# Patient Record
Sex: Female | Born: 1980 | Race: White | Hispanic: No | Marital: Married | Smoking: Current some day smoker
Health system: Southern US, Community
[De-identification: ages and names within clinical notes are randomized; demographics above are authoritative.]

## PROBLEM LIST (undated history)

## (undated) DIAGNOSIS — E039 Hypothyroidism, unspecified: Secondary | ICD-10-CM

## (undated) DIAGNOSIS — D649 Anemia, unspecified: Secondary | ICD-10-CM

## (undated) DIAGNOSIS — K8020 Calculus of gallbladder without cholecystitis without obstruction: Secondary | ICD-10-CM

## (undated) DIAGNOSIS — F419 Anxiety disorder, unspecified: Secondary | ICD-10-CM

## (undated) DIAGNOSIS — I10 Essential (primary) hypertension: Secondary | ICD-10-CM

## (undated) DIAGNOSIS — F410 Panic disorder [episodic paroxysmal anxiety] without agoraphobia: Secondary | ICD-10-CM

## (undated) DIAGNOSIS — F32A Depression, unspecified: Secondary | ICD-10-CM

## (undated) DIAGNOSIS — R51 Headache: Secondary | ICD-10-CM

## (undated) DIAGNOSIS — F329 Major depressive disorder, single episode, unspecified: Secondary | ICD-10-CM

## (undated) DIAGNOSIS — E669 Obesity, unspecified: Secondary | ICD-10-CM

## (undated) DIAGNOSIS — K219 Gastro-esophageal reflux disease without esophagitis: Secondary | ICD-10-CM

## (undated) DIAGNOSIS — Z9289 Personal history of other medical treatment: Secondary | ICD-10-CM

## (undated) DIAGNOSIS — R519 Headache, unspecified: Secondary | ICD-10-CM

## (undated) HISTORY — PX: TUBAL LIGATION: SHX77

## (undated) HISTORY — DX: Obesity, unspecified: E66.9

## (undated) HISTORY — DX: Morbid (severe) obesity due to excess calories: E66.01

## (undated) HISTORY — DX: Calculus of gallbladder without cholecystitis without obstruction: K80.20

## (undated) HISTORY — PX: DILATION AND CURETTAGE OF UTERUS: SHX78

---

## 2009-12-29 ENCOUNTER — Emergency Department (HOSPITAL_COMMUNITY): Admission: EM | Admit: 2009-12-29 | Discharge: 2009-12-29 | Payer: Self-pay | Admitting: Emergency Medicine

## 2011-01-19 ENCOUNTER — Emergency Department (HOSPITAL_COMMUNITY)
Admission: EM | Admit: 2011-01-19 | Discharge: 2011-01-19 | Payer: Self-pay | Source: Home / Self Care | Admitting: Emergency Medicine

## 2011-01-19 LAB — URINALYSIS, ROUTINE W REFLEX MICROSCOPIC
Ketones, ur: NEGATIVE mg/dL
Nitrite: NEGATIVE
Protein, ur: NEGATIVE mg/dL

## 2011-01-19 LAB — PREGNANCY, URINE: Preg Test, Ur: NEGATIVE

## 2012-05-24 ENCOUNTER — Encounter (HOSPITAL_COMMUNITY): Payer: Self-pay

## 2012-05-24 ENCOUNTER — Emergency Department (HOSPITAL_COMMUNITY)
Admission: EM | Admit: 2012-05-24 | Discharge: 2012-05-25 | Disposition: A | Discharge status: Home / Self Care | Payer: Self-pay | Attending: Emergency Medicine | Admitting: Emergency Medicine

## 2012-05-24 DIAGNOSIS — F17200 Nicotine dependence, unspecified, uncomplicated: Secondary | ICD-10-CM | POA: Insufficient documentation

## 2012-05-24 DIAGNOSIS — K0889 Other specified disorders of teeth and supporting structures: Secondary | ICD-10-CM

## 2012-05-24 DIAGNOSIS — K029 Dental caries, unspecified: Secondary | ICD-10-CM | POA: Insufficient documentation

## 2012-05-24 MED ORDER — SUCCINYLCHOLINE CHLORIDE 20 MG/ML IJ SOLN
INTRAMUSCULAR | Status: AC
  Filled 2012-05-24: qty 1

## 2012-05-24 MED ORDER — ROCURONIUM BROMIDE 50 MG/5ML IV SOLN
INTRAVENOUS | Status: AC
  Filled 2012-05-24: qty 2

## 2012-05-24 MED ORDER — ETOMIDATE 2 MG/ML IV SOLN
INTRAVENOUS | Status: AC
  Filled 2012-05-24: qty 20

## 2012-05-24 MED ORDER — LIDOCAINE HCL (CARDIAC) 20 MG/ML IV SOLN
INTRAVENOUS | Status: AC
  Filled 2012-05-24: qty 5

## 2012-05-24 NOTE — ED Notes (Signed)
Pt c/o tooth pain from broken tooth on Monday.

## 2012-05-25 MED ORDER — HYDROCODONE-ACETAMINOPHEN 5-325 MG PO TABS: ORAL_TABLET | ORAL | Status: DC

## 2012-05-25 MED ORDER — HYDROCODONE-ACETAMINOPHEN 5-325 MG PO TABS
2.0000 | ORAL_TABLET | Freq: Once | ORAL | Status: AC
  Administered 2012-05-25: 2 via ORAL
  Filled 2012-05-25: qty 2

## 2012-05-25 MED ORDER — AMOXICILLIN 500 MG PO CAPS: ORAL_CAPSULE | ORAL | Status: DC

## 2012-05-25 MED ORDER — ONDANSETRON HCL 4 MG PO TABS
4.0000 mg | ORAL_TABLET | Freq: Once | ORAL | Status: AC
  Administered 2012-05-25: 4 mg via ORAL
  Filled 2012-05-25: qty 1

## 2012-05-25 MED ORDER — IBUPROFEN 800 MG PO TABS
800.0000 mg | ORAL_TABLET | Freq: Once | ORAL | Status: AC
  Administered 2012-05-25: 800 mg via ORAL
  Filled 2012-05-25: qty 1

## 2012-05-25 MED ORDER — PENICILLIN V POTASSIUM 250 MG PO TABS
500.0000 mg | ORAL_TABLET | Freq: Once | ORAL | Status: AC
  Administered 2012-05-25: 500 mg via ORAL
  Filled 2012-05-25: qty 1

## 2012-05-25 NOTE — ED Provider Notes (Signed)
History     CSN: [id number]  Arrival date & time 05/24/12  2303   First MD Initiated Contact with Patient 05/24/12 2318      Chief Complaint  Patient presents with  . Dental Pain    (Consider location/radiation/quality/duration/timing/severity/associated sxs/prior treatment) Patient is a 31 y.o. female presenting with tooth pain. The history is provided by the patient.  Dental PainThe primary symptoms include mouth pain. Primary symptoms do not include shortness of breath or cough. The symptoms began 3 to 5 days ago. The symptoms are worsening. The symptoms are chronic. The symptoms occur frequently.  Additional symptoms include: gum swelling and jaw pain. Additional symptoms do not include: nosebleeds. Medical issues include: smoking.    History reviewed. No pertinent past medical history.  History reviewed. No pertinent past surgical history.  No family history on file.  History  Substance Use Topics  . Smoking status: Current Some Day Smoker  . Smokeless tobacco: Not on file  . Alcohol Use: No    OB History    Grav Para Term Preterm Abortions TAB SAB Ect Mult Living                  Review of Systems  Constitutional: Negative for activity change.       All ROS Neg except as noted in HPI  HENT: Negative for nosebleeds and neck pain.   Eyes: Negative for photophobia and discharge.  Respiratory: Negative for cough, shortness of breath and wheezing.   Cardiovascular: Negative for chest pain and palpitations.  Gastrointestinal: Negative for abdominal pain and blood in stool.  Genitourinary: Negative for dysuria, frequency and hematuria.  Musculoskeletal: Negative for back pain and arthralgias.  Skin: Negative.   Neurological: Negative for dizziness, seizures and speech difficulty.  Psychiatric/Behavioral: Negative for hallucinations and confusion.    Allergies  Review of patient's allergies indicates no known allergies.  Home Medications   Current Outpatient Rx   Name Route Sig Dispense Refill  . AMOXICILLIN 500 MG PO CAPS  2 po bid with food 28 capsule 0  . HYDROCODONE-ACETAMINOPHEN 5-325 MG PO TABS  1 po q4h prn pain 20 tablet 0    BP 126/87  Pulse 76  Temp 98.6 F (37 C)  Resp 20  Ht 5\' 6"  (1.676 m)  Wt 300 lb (136.079 kg)  BMI 48.42 kg/m2  SpO2 98%  LMP 05/19/2012  Physical Exam  Nursing note and vitals reviewed. Constitutional: She is oriented to person, place, and time. She appears well-developed and well-nourished.  Non-toxic appearance.  HENT:  Head: Normocephalic.  Right Ear: Tympanic membrane and external ear normal.  Left Ear: Tympanic membrane and external ear normal.       Multiple dental deep dental caries. Right side more than left.  Eyes: EOM and lids are normal. Pupils are equal, round, and reactive to light.  Neck: Normal range of motion. Neck supple. Carotid bruit is not present.  Cardiovascular: Normal rate, regular rhythm, normal heart sounds, intact distal pulses and normal pulses.   Pulmonary/Chest: Breath sounds normal. No respiratory distress.  Abdominal: Soft. Bowel sounds are normal. There is no tenderness. There is no guarding.  Musculoskeletal: Normal range of motion.  Lymphadenopathy:       Head (right side): No submandibular adenopathy present.       Head (left side): No submandibular adenopathy present.    She has no cervical adenopathy.  Neurological: She is alert and oriented to person, place, and time. She has normal strength. No  cranial nerve deficit or sensory deficit.  Skin: Skin is warm and dry.  Psychiatric: She has a normal mood and affect. Her speech is normal.    ED Course  Procedures (including critical care time)  Labs Reviewed - No data to display No results found.   1. Pain, dental       MDM  I have reviewed nursing notes, vital signs, and all appropriate lab and imaging results for this patient. Pt has an appointment with dentist in 2 weeks. Multiple dental caries present.  Rx for amoxicillin and Norco given.       Kathie Dike, [place] 05/25/12 [phone]

## 2012-05-25 NOTE — Discharge Instructions (Signed)
Dental Pain  A tooth ache may be caused by cavities (tooth decay). Cavities expose the nerve of the tooth to air and hot or cold temperatures. It may come from an infection or abscess (also called a boil or furuncle) around your tooth. It is also often caused by dental caries (tooth decay). This causes the pain you are having.  DIAGNOSIS   Your caregiver can diagnose this problem by exam.  TREATMENT   · If caused by an infection, it may be treated with medications which kill germs (antibiotics) and pain medications as prescribed by your caregiver. Take medications as directed.  · Only take over-the-counter or prescription medicines for pain, discomfort, or fever as directed by your caregiver.  · Whether the tooth ache today is caused by infection or dental disease, you should see your dentist as soon as possible for further care.  SEEK MEDICAL CARE IF:  The exam and treatment you received today has been provided on an emergency basis only. This is not a substitute for complete medical or dental care. If your problem worsens or new problems (symptoms) appear, and you are unable to meet with your dentist, call or return to this location.  SEEK IMMEDIATE MEDICAL CARE IF:   · You have a fever.  · You develop redness and swelling of your face, jaw, or neck.  · You are unable to open your mouth.  · You have severe pain uncontrolled by pain medicine.  MAKE SURE YOU:   · Understand these instructions.  · Will watch your condition.  · Will get help right away if you are not doing well or get worse.  Document Released: 12/06/2005 Document Revised: 11/25/2011 Document Reviewed: 07/24/2008  [employer]® Patient Information ©2012 [employer].

## 2012-05-25 NOTE — ED Provider Notes (Signed)
Medical screening examination/treatment/procedure(s) were performed by non-physician practitioner and as supervising physician I was immediately available for consultation/collaboration.  Nicoletta Dress. Colon Branch, MD 05/25/12 [phone]

## 2012-06-13 ENCOUNTER — Emergency Department (HOSPITAL_COMMUNITY)
Admission: EM | Admit: 2012-06-13 | Discharge: 2012-06-14 | Disposition: A | Discharge status: Home / Self Care | Payer: No Typology Code available for payment source | Attending: Emergency Medicine | Admitting: Emergency Medicine

## 2012-06-13 ENCOUNTER — Encounter (HOSPITAL_COMMUNITY): Payer: Self-pay | Admitting: *Deleted

## 2012-06-13 ENCOUNTER — Emergency Department (HOSPITAL_COMMUNITY): Payer: No Typology Code available for payment source

## 2012-06-13 DIAGNOSIS — S139XXA Sprain of joints and ligaments of unspecified parts of neck, initial encounter: Secondary | ICD-10-CM | POA: Insufficient documentation

## 2012-06-13 DIAGNOSIS — H6690 Otitis media, unspecified, unspecified ear: Secondary | ICD-10-CM

## 2012-06-13 DIAGNOSIS — M542 Cervicalgia: Secondary | ICD-10-CM | POA: Insufficient documentation

## 2012-06-13 DIAGNOSIS — S161XXA Strain of muscle, fascia and tendon at neck level, initial encounter: Secondary | ICD-10-CM

## 2012-06-13 MED ORDER — NAPROXEN 500 MG PO TABS: 500.0000 mg | ORAL_TABLET | Freq: Two times a day (BID) | ORAL | Status: AC

## 2012-06-13 MED ORDER — CYCLOBENZAPRINE HCL 10 MG PO TABS: 10.0000 mg | ORAL_TABLET | Freq: Three times a day (TID) | ORAL | Status: AC | PRN
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
## 2012-06-13 NOTE — ED Notes (Signed)
Pt back from CT
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
## 2012-06-13 NOTE — ED Notes (Signed)
MD at bedside. 
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
## 2012-06-14 NOTE — ED Provider Notes (Signed)
Medical screening examination/treatment/procedure(s) were performed by non-physician practitioner and as supervising physician I was immediately available for consultation/collaboration.   Dione Booze, MD 06/14/12 [phone]

## 2012-11-01 ENCOUNTER — Emergency Department (HOSPITAL_COMMUNITY): Payer: Self-pay

## 2012-11-01 ENCOUNTER — Encounter (HOSPITAL_COMMUNITY): Payer: Self-pay | Admitting: Emergency Medicine

## 2012-11-01 ENCOUNTER — Emergency Department (HOSPITAL_COMMUNITY)
Admission: EM | Admit: 2012-11-01 | Discharge: 2012-11-01 | Disposition: A | Discharge status: Home / Self Care | Payer: Self-pay | Attending: Emergency Medicine | Admitting: Emergency Medicine

## 2012-11-01 DIAGNOSIS — F17200 Nicotine dependence, unspecified, uncomplicated: Secondary | ICD-10-CM | POA: Insufficient documentation

## 2012-11-01 DIAGNOSIS — Z791 Long term (current) use of non-steroidal anti-inflammatories (NSAID): Secondary | ICD-10-CM | POA: Insufficient documentation

## 2012-11-01 DIAGNOSIS — I10 Essential (primary) hypertension: Secondary | ICD-10-CM | POA: Insufficient documentation

## 2012-11-01 DIAGNOSIS — R1310 Dysphagia, unspecified: Secondary | ICD-10-CM | POA: Insufficient documentation

## 2012-11-01 DIAGNOSIS — R1110 Vomiting, unspecified: Secondary | ICD-10-CM | POA: Insufficient documentation

## 2012-11-01 HISTORY — DX: Essential (primary) hypertension: I10

## 2012-11-01 NOTE — ED Notes (Signed)
Pt states "I feel like my throat is closing on me" states she was out eating and started having trouble swallowing. Pt is AAx4 at this time, airway clear and unobstructed, lung sounds are clear at this time. Normal color. 100% O2 sats on room air.

## 2012-11-01 NOTE — ED Provider Notes (Signed)
History     CSN: [id number]  Arrival date & time 11/01/12  1949   First MD Initiated Contact with Patient 11/01/12 2000      Chief Complaint  Patient presents with  . Shortness of Breath  . Dysphagia  . Emesis    (Consider location/radiation/quality/duration/timing/severity/associated sxs/prior treatment) Patient is a 31 y.o. female presenting with shortness of breath and vomiting. The history is provided by the patient (the pt felt like something was stuck in her throat and then vomited). No language interpreter was used.  Shortness of Breath  The current episode started today. The problem occurs rarely. The problem has been resolved. The problem is mild. Nothing relieves the symptoms. Nothing aggravates the symptoms. Associated symptoms include shortness of breath. Pertinent negatives include no chest pain and no cough. The Heimlich maneuver was not attempted.  Emesis  Pertinent negatives include no abdominal pain, no cough, no diarrhea and no headaches.    Past Medical History  Diagnosis Date  . Hypertension     History reviewed. No pertinent past surgical history.  History reviewed. No pertinent family history.  History  Substance Use Topics  . Smoking status: Current Some Day Smoker  . Smokeless tobacco: Not on file  . Alcohol Use: No    OB History    Grav Para Term Preterm Abortions TAB SAB Ect Mult Living                  Review of Systems  Constitutional: Negative for fatigue.  HENT: Negative for congestion, sinus pressure and ear discharge.   Eyes: Negative for discharge.  Respiratory: Positive for shortness of breath. Negative for cough.   Cardiovascular: Negative for chest pain.  Gastrointestinal: Positive for vomiting. Negative for abdominal pain and diarrhea.  Genitourinary: Negative for frequency and hematuria.  Musculoskeletal: Negative for back pain.  Skin: Negative for rash.  Neurological: Negative for seizures and headaches.  Hematological:  Negative.   Psychiatric/Behavioral: Negative for hallucinations.    Allergies  Review of patient's allergies indicates no known allergies.  Home Medications   Current Outpatient Rx  Name  Route  Sig  Dispense  Refill  . NAPROXEN 500 MG PO TABS   Oral   Take 1 tablet (500 mg total) by mouth 2 (two) times daily.   30 tablet   0   . ACETAMINOPHEN 500 MG PO TABS   Oral   Take 1,000 mg by mouth daily as needed. For pain           BP 139/58  Pulse 85  Temp 97.7 F (36.5 C)  Resp 18  SpO2 100%  LMP 10/09/2012  Physical Exam  Constitutional: She is oriented to person, place, and time. She appears well-developed.  HENT:  Head: Normocephalic and atraumatic.  Eyes: Conjunctivae normal and EOM are normal. No scleral icterus.  Neck: Neck supple. No thyromegaly present.  Cardiovascular: Normal rate and regular rhythm.  Exam reveals no gallop and no friction rub.   No murmur heard. Pulmonary/Chest: No stridor. She has no wheezes. She has no rales. She exhibits no tenderness.  Abdominal: She exhibits no distension. There is no tenderness. There is no rebound.  Musculoskeletal: Normal range of motion. She exhibits no edema.  Lymphadenopathy:    She has no cervical adenopathy.  Neurological: She is oriented to person, place, and time. Coordination normal.  Skin: No rash noted. No erythema.  Psychiatric: She has a normal mood and affect. Her behavior is normal.    ED  Course  Procedures (including critical care time)  Labs Reviewed - No data to display Dg Chest 2 View  11/01/2012  *RADIOLOGY REPORT*  Clinical Data: Short of breath.  CHEST - 2 VIEW  Comparison: None.  Findings: The heart is at the upper limits of normal in size. Mediastinal shadows are normal.  Lungs are clear.  Vascularity is normal.  No effusions.  No bony abnormalities.  IMPRESSION: Normal chest   Original Report Authenticated By: Paulina Fusi, M.D.      1. Vomiting     Pt swallowed water without  problems  MDM          Benny Lennert, MD 11/01/12 2121

## 2015-01-29 ENCOUNTER — Telehealth: Payer: Self-pay | Admitting: Family Medicine

## 2015-01-30 NOTE — Telephone Encounter (Signed)
Patient aware that our 1st new patient appointment is toward the end of march.

## 2015-02-09 ENCOUNTER — Encounter (HOSPITAL_COMMUNITY): Payer: Self-pay | Admitting: Emergency Medicine

## 2015-02-09 ENCOUNTER — Emergency Department (HOSPITAL_COMMUNITY): Payer: Medicaid Other

## 2015-02-09 ENCOUNTER — Emergency Department (HOSPITAL_COMMUNITY)
Admission: EM | Admit: 2015-02-09 | Discharge: 2015-02-09 | Disposition: A | Discharge status: Home / Self Care | Payer: Medicaid Other | Attending: Emergency Medicine | Admitting: Emergency Medicine

## 2015-02-09 DIAGNOSIS — R109 Unspecified abdominal pain: Secondary | ICD-10-CM | POA: Diagnosis not present

## 2015-02-09 DIAGNOSIS — R197 Diarrhea, unspecified: Secondary | ICD-10-CM | POA: Diagnosis not present

## 2015-02-09 DIAGNOSIS — Z720 Tobacco use: Secondary | ICD-10-CM | POA: Insufficient documentation

## 2015-02-09 DIAGNOSIS — I10 Essential (primary) hypertension: Secondary | ICD-10-CM | POA: Insufficient documentation

## 2015-02-09 DIAGNOSIS — M545 Low back pain: Secondary | ICD-10-CM | POA: Insufficient documentation

## 2015-02-09 DIAGNOSIS — Z3202 Encounter for pregnancy test, result negative: Secondary | ICD-10-CM | POA: Diagnosis not present

## 2015-02-09 DIAGNOSIS — R112 Nausea with vomiting, unspecified: Secondary | ICD-10-CM | POA: Insufficient documentation

## 2015-02-09 LAB — COMPREHENSIVE METABOLIC PANEL
ALT: 15 U/L (ref 0–35)
AST: 16 U/L (ref 0–37)
Albumin: 4 g/dL (ref 3.5–5.2)
Alkaline Phosphatase: 60 U/L (ref 39–117)
Anion gap: 3 — ABNORMAL LOW (ref 5–15)
BUN: 7 mg/dL (ref 6–23)
CALCIUM: 9 mg/dL (ref 8.4–10.5)
CHLORIDE: 106 mmol/L (ref 96–112)
CO2: 28 mmol/L (ref 19–32)
CREATININE: 0.88 mg/dL (ref 0.50–1.10)
GFR calc Af Amer: >90 mL/min (ref >90)
GFR calc non Af Amer: 85 mL/min — ABNORMAL LOW (ref >90)
GLUCOSE: 99 mg/dL (ref 70–99)
Potassium: 3.5 mmol/L (ref 3.5–5.1)
SODIUM: 137 mmol/L (ref 135–145)
Total Bilirubin: 0.9 mg/dL (ref 0.3–1.2)
Total Protein: 7.4 g/dL (ref 6.0–8.3)

## 2015-02-09 LAB — URINALYSIS, ROUTINE W REFLEX MICROSCOPIC
Glucose, UA: NEGATIVE mg/dL
Ketones, ur: NEGATIVE mg/dL
Leukocytes, UA: NEGATIVE
Nitrite: NEGATIVE
SPECIFIC GRAVITY, URINE: 1.015 (ref 1.005–1.030)
Urobilinogen, UA: 1 mg/dL (ref 0.0–1.0)
pH: 8.5 — ABNORMAL HIGH (ref 5.0–8.0)

## 2015-02-09 LAB — CBC WITH DIFFERENTIAL/PLATELET
BASOS ABS: 0.1 10*3/uL (ref 0.0–0.1)
Basophils Relative: 1 % (ref 0–1)
EOS ABS: 0.3 10*3/uL (ref 0.0–0.7)
EOS PCT: 5 % (ref 0–5)
HCT: 33.4 % — ABNORMAL LOW (ref 36.0–46.0)
HEMOGLOBIN: 10.3 g/dL — AB (ref 12.0–15.0)
LYMPHS PCT: 28 % (ref 12–46)
Lymphs Abs: 1.6 10*3/uL (ref 0.7–4.0)
MCH: 24.8 pg — ABNORMAL LOW (ref 26.0–34.0)
MCHC: 30.8 g/dL (ref 30.0–36.0)
MCV: 80.3 fL (ref 78.0–100.0)
MONO ABS: 0.5 10*3/uL (ref 0.1–1.0)
Monocytes Relative: 8 % (ref 3–12)
NEUTROS ABS: 3.3 10*3/uL (ref 1.7–7.7)
Neutrophils Relative %: 58 % (ref 43–77)
PLATELETS: 207 10*3/uL (ref 150–400)
RBC: 4.16 MIL/uL (ref 3.87–5.11)
RDW: 16.6 % — ABNORMAL HIGH (ref 11.5–15.5)
WBC: 5.7 10*3/uL (ref 4.0–10.5)

## 2015-02-09 LAB — URINE MICROSCOPIC-ADD ON

## 2015-02-09 LAB — PREGNANCY, URINE: PREG TEST UR: NEGATIVE

## 2015-02-09 MED ORDER — CYCLOBENZAPRINE HCL 10 MG PO TABS: 10.0000 mg | ORAL_TABLET | Freq: Three times a day (TID) | ORAL | Status: DC | PRN

## 2015-02-09 MED ORDER — HYDROCODONE-ACETAMINOPHEN 5-325 MG PO TABS: 2.0000 | ORAL_TABLET | ORAL | Status: DC | PRN

## 2015-02-09 MED ORDER — IBUPROFEN 800 MG PO TABS: 800.0000 mg | ORAL_TABLET | Freq: Three times a day (TID) | ORAL | Status: DC

## 2015-02-09 MED ORDER — ONDANSETRON HCL 4 MG/2ML IJ SOLN
4.0000 mg | Freq: Once | INTRAMUSCULAR | Status: AC
  Administered 2015-02-09: 4 mg via INTRAVENOUS
  Filled 2015-02-09: qty 2

## 2015-02-09 MED ORDER — HYDROMORPHONE HCL 1 MG/ML IJ SOLN
1.0000 mg | Freq: Once | INTRAMUSCULAR | Status: AC
  Administered 2015-02-09: 1 mg via INTRAVENOUS
  Filled 2015-02-09: qty 1

## 2015-02-09 MED ORDER — SODIUM CHLORIDE 0.9 % IV SOLN
Freq: Once | INTRAVENOUS | Status: AC
  Administered 2015-02-09: 13:00:00 via INTRAVENOUS

## 2015-02-09 NOTE — ED Provider Notes (Signed)
CSN: [id number]     Arrival date & time 02/09/15  1207 History  This chart was scribed for Gilda Crease, * by Haywood Pao, ED Scribe. The patient was seen in [facility] and the patient's care was started at 12:59 PM.  Chief Complaint  Patient presents with  . Flank Pain   Patient is a 34 y.o. female presenting with flank pain. The history is provided by the patient. No language interpreter was used.  Flank Pain This is a recurrent problem. The current episode started more than 2 days ago. The problem occurs constantly. The problem has been rapidly worsening. Nothing aggravates the symptoms. Nothing relieves the symptoms. She has tried nothing for the symptoms.    HPI Comments: Amber Colon is a 34 y.o. female who presents to the Emergency Department complaining of left lower back pain. First started Thursday when reaching up, pain subsided yesterday and returned suddenly last night with associated diarrhea, nausea and vomiting. She has an odor to her urine and difficulty urinating    Past Medical History  Diagnosis Date  . Hypertension    History reviewed. No pertinent past surgical history. History reviewed. No pertinent family history. History  Substance Use Topics  . Smoking status: Current Some Day Smoker  . Smokeless tobacco: Not on file  . Alcohol Use: No   OB History    No data available     Review of Systems  Gastrointestinal: Positive for nausea, vomiting and diarrhea.  Genitourinary: Positive for flank pain and difficulty urinating.  Musculoskeletal: Positive for back pain.  All other systems reviewed and are negative.  Allergies  Review of patient's allergies indicates no known allergies.  Home Medications   Prior to Admission medications   Medication Sig Start Date End Date Taking? Authorizing Provider  acetaminophen (TYLENOL) 500 MG tablet Take 1,000 mg by mouth daily as needed. For pain    Historical Provider, MD   BP 133/86 mmHg  Pulse  78  Temp(Src) 98.6 F (37 C) (Oral)  Resp 16  Ht  (1.676 m)  Wt 300 lb (136.079 kg)  BMI 48.44 kg/m2  SpO2 100%  LMP 12/26/2014 Physical Exam  Constitutional: She is oriented to person, place, and time. She appears well-developed and well-nourished. No distress.  HENT:  Head: Normocephalic and atraumatic.  Right Ear: Hearing normal.  Left Ear: Hearing normal.  Nose: Nose normal.  Mouth/Throat: Oropharynx is clear and moist and mucous membranes are normal.  Eyes: Conjunctivae and EOM are normal. Pupils are equal, round, and reactive to light.  Neck: Normal range of motion. Neck supple.  Cardiovascular: Regular rhythm, S1 normal and S2 normal.  Exam reveals no gallop and no friction rub.   No murmur heard. Pulmonary/Chest: Effort normal and breath sounds normal. No respiratory distress. She exhibits no tenderness.  Abdominal: Soft. Normal appearance and bowel sounds are normal. There is no hepatosplenomegaly. There is no rebound, no guarding, no tenderness at McBurney's point and negative Murphy's sign. No hernia.  Possible CVA tenderness  Musculoskeletal: Normal range of motion.  Right perispinal muscle tenderness  Neurological: She is alert and oriented to person, place, and time. She has normal strength. No cranial nerve deficit or sensory deficit. Coordination normal. GCS eye subscore is 4. GCS verbal subscore is 5. GCS motor subscore is 6.  Skin: Skin is warm, dry and intact. No rash noted. No cyanosis.  Psychiatric: She has a normal mood and affect. Her speech is normal and behavior is normal.  Thought content normal.  Nursing note and vitals reviewed.   ED Course  Procedures  DIAGNOSTIC STUDIES: Oxygen Saturation is 100% on room air, normal by my interpretation.    COORDINATION OF CARE: 1:02 PM Discussed treatment plan with pt at bedside and pt agreed to plan.   Labs Review Labs Reviewed  PREGNANCY, URINE  URINALYSIS, ROUTINE W REFLEX MICROSCOPIC   Imaging  Review No results found.   EKG Interpretation None      MDM   Final diagnoses:  None  back pain   Patient presents to the ER for evaluation of left lower back pain. Patient reports that the pain was present last night and then resolved. He came back today and has been constant. She does have tenderness in the right lower soft tissue region. Lab work is unremarkable. Urinalysis does not suggest infection. CT scan performed and there is no evidence of ureterolithiasis or other acute abnormality. Patient treated with analgesia in the ER, will be treated with analgesia and rest at home.   I personally performed the services described in this documentation, which was scribed in my presence. The recorded information has been reviewed and is accurate.       Gilda Creasehristopher J. Pollina, MD 02/09/15 [phone]

## 2015-02-09 NOTE — ED Notes (Signed)
Pt verbalized understanding of discharge instructions.

## 2015-02-09 NOTE — Discharge Instructions (Signed)
Flank Pain Flank pain refers to pain that is located on the side of the body between the upper abdomen and the back. The pain may occur over a short period of time (acute) or may be long-term or reoccurring (chronic). It may be mild or severe. Flank pain can be caused by many things. CAUSES  Some of the more common causes of flank pain include:  Muscle strains.   Muscle spasms.   A disease of your spine (vertebral disk disease).   A lung infection (pneumonia).   Fluid around your lungs (pulmonary edema).   A kidney infection.   Kidney stones.   A very painful skin rash caused by the chickenpox virus (shingles).   Gallbladder disease.  HOME CARE INSTRUCTIONS  Home care will depend on the cause of your pain. In general,  Rest as directed by your caregiver.  Drink enough fluids to keep your urine clear or pale yellow.  Only take over-the-counter or prescription medicines as directed by your caregiver. Some medicines may help relieve the pain.  Tell your caregiver about any changes in your pain.  Follow up with your caregiver as directed. SEEK IMMEDIATE MEDICAL CARE IF:   Your pain is not controlled with medicine.   You have new or worsening symptoms.  Your pain increases.   You have abdominal pain.   You have shortness of breath.   You have persistent nausea or vomiting.   You have swelling in your abdomen.   You feel faint or pass out.   You have blood in your urine.  You have a fever or persistent symptoms for more than 2-3 days.  You have a fever and your symptoms suddenly get worse. MAKE SURE YOU:   Understand these instructions.  Will watch your condition.  Will get help right away if you are not doing well or get worse. Document Released: 01/27/2006 Document Revised: 08/30/2012 Document Reviewed: 07/20/2012 [facility] LPExit[facility] Patient Information 2015 [place], [place]. This information is not intended to replace advice given to you by your  health care provider. Make sure you discuss any questions you have with your health care provider. Back Pain, Adult Low back pain is very common. About 1 in 5 people have back pain.The cause of low back pain is rarely dangerous. The pain often gets better over time.About half of people with a sudden onset of back pain feel better in just 2 weeks. About 8 in 10 people feel better by 6 weeks.  CAUSES Some common causes of back pain include:  Strain of the muscles or ligaments supporting the spine.  Wear and tear (degeneration) of the spinal discs.  Arthritis.  Direct injury to the back. DIAGNOSIS Most of the time, the direct cause of low back pain is not known.However, back pain can be treated effectively even when the exact cause of the pain is unknown.Answering your caregiver's questions about your overall health and symptoms is one of the most accurate ways to make sure the cause of your pain is not dangerous. If your caregiver needs more information, he or she may order lab work or imaging tests (X-rays or MRIs).However, even if imaging tests show changes in your back, this usually does not require surgery. HOME CARE INSTRUCTIONS For many people, back pain returns.Since low back pain is rarely dangerous, it is often a condition that people can learn to [facility] LLCmanageon their own.   Remain active. It is stressful on the back to sit or stand in one place. Do not sit, drive,  or stand in one place for more than 30 minutes at a time. Take short walks on level surfaces as soon as pain allows. Try to increase the length of time you walk each day. °· Do not stay in bed. Resting more than 1 or 2 days can delay your recovery. °· Do not avoid exercise or work. Your body is made to move. It is not dangerous to be active, even though your back may hurt. Your back will likely heal faster if you return to being active before your pain is gone. °· Pay attention to your body when you  bend and lift. Many people  have less discomfort when lifting if they bend their knees, keep the load close to their bodies, and avoid twisting. Often, the most comfortable positions are those that put less stress on your recovering back. °· Find a comfortable position to sleep. Use a firm mattress and lie on your side with your knees slightly bent. If you lie on your back, put a pillow under your knees. °· Only take over-the-counter or prescription medicines as directed by your caregiver. Over-the-counter medicines to reduce pain and inflammation are often the most helpful. Your caregiver may prescribe muscle relaxant drugs. These medicines help dull your pain so you can more quickly return to your normal activities and healthy exercise. °· Put ice on the injured area. °¨ Put ice in a plastic bag. °¨ Place a towel between your skin and the bag. °¨ Leave the ice on for 15-20 minutes, 03-04 times a day for the first 2 to 3 days. After that, ice and heat may be alternated to reduce pain and spasms. °· Ask your caregiver about trying back exercises and gentle massage. This may be of some benefit. °· Avoid feeling anxious or stressed. Stress increases muscle tension and can worsen back pain. It is important to recognize when you are anxious or stressed and learn ways to manage it. Exercise is a great option. °SEEK MEDICAL CARE IF: °· You have pain that is not relieved with rest or medicine. °· You have pain that does not improve in 1 week. °· You have new symptoms. °· You are generally not feeling well. °SEEK IMMEDIATE MEDICAL CARE IF:  °· You have pain that radiates from your back into your legs. °· You develop new bowel or bladder control problems. °· You have unusual weakness or numbness in your arms or legs. °· You develop nausea or vomiting. °· You develop abdominal pain. °· You feel faint. °Document Released: 12/06/2005 Document Revised: 06/06/2012 Document Reviewed: 04/09/2014 °Exit[employer]® Patient Information ©2015 [employer]. This  information is not intended to replace advice given to you by your health care provider. Make sure you discuss any questions you have with your health care provider. ° °

## 2015-02-09 NOTE — ED Notes (Signed)
Having pain to left lower right back.  Rates pain 8, have not taken any medication.

## 2015-05-02 ENCOUNTER — Telehealth: Payer: Self-pay | Admitting: Family Medicine

## 2015-05-13 NOTE — Telephone Encounter (Signed)
Pt given new pt appt with [name] 7/1 at 9:10. Pt aware to arrive 30 minutes prior with a copy of insurance card a valid photo ID.

## 2015-05-26 ENCOUNTER — Encounter: Payer: Self-pay | Admitting: *Deleted

## 2015-06-18 ENCOUNTER — Encounter (INDEPENDENT_AMBULATORY_CARE_PROVIDER_SITE_OTHER): Payer: Self-pay

## 2015-06-18 ENCOUNTER — Encounter: Payer: Self-pay | Admitting: Family

## 2015-06-18 ENCOUNTER — Ambulatory Visit (INDEPENDENT_AMBULATORY_CARE_PROVIDER_SITE_OTHER): Payer: Medicaid Other | Admitting: Family

## 2015-06-18 VITALS — BP 132/102 | HR 83 | Temp 97.6°F | Ht 66.0 in | Wt 343.2 lb

## 2015-06-18 DIAGNOSIS — H6691 Otitis media, unspecified, right ear: Secondary | ICD-10-CM | POA: Diagnosis not present

## 2015-06-18 DIAGNOSIS — D509 Iron deficiency anemia, unspecified: Secondary | ICD-10-CM

## 2015-06-18 DIAGNOSIS — I10 Essential (primary) hypertension: Secondary | ICD-10-CM | POA: Diagnosis not present

## 2015-06-18 DIAGNOSIS — E039 Hypothyroidism, unspecified: Secondary | ICD-10-CM

## 2015-06-18 MED ORDER — HYDROCHLOROTHIAZIDE 12.5 MG PO TABS: 12.5000 mg | ORAL_TABLET | Freq: Every day | ORAL | Status: DC

## 2015-06-18 MED ORDER — AMOXICILLIN 875 MG PO TABS: 875.0000 mg | ORAL_TABLET | Freq: Two times a day (BID) | ORAL | Status: DC

## 2015-06-18 NOTE — Patient Instructions (Addendum)
Health Maintenance Adopting a healthy lifestyle and getting preventive care can go a long way to promote health and wellness. Talk with your health care provider about what schedule of regular examinations is right for you. This is a good chance for you to check in with your provider about disease prevention and staying healthy. In between checkups, there are plenty of things you can do on your own. Experts have done a lot of research about which lifestyle changes and preventive measures are most likely to keep you healthy. Ask your health care provider for more information. WEIGHT AND DIET  Eat a healthy diet  Be sure to include plenty of vegetables, fruits, low-fat dairy products, and lean protein.  Do not eat a lot of foods high in solid fats, added sugars, or salt.  Get regular exercise. This is one of the most important things you can do for your health.  Most adults should exercise for at least 150 minutes each week. The exercise should increase your heart rate and make you sweat (moderate-intensity exercise).  Most adults should also do strengthening exercises at least twice a week. This is in addition to the moderate-intensity exercise.  Maintain a healthy weight  Body mass index (BMI) is a measurement that can be used to identify possible weight problems. It estimates body fat based on height and weight. Your health care provider can help determine your BMI and help you achieve or maintain a healthy weight.  For females 25 years of age and older:   A BMI below 18.5 is considered underweight.  A BMI of 18.5 to 24.9 is normal.  A BMI of 25 to 29.9 is considered overweight.  A BMI of 30 and above is considered obese.  Watch levels of cholesterol and blood lipids  You should start having your blood tested for lipids and cholesterol at 34 years of age, then have this test every 5 years.  You may need to have your cholesterol levels checked more often if:  Your lipid or  cholesterol levels are high.  You are older than 34 years of age.  You are at high risk for heart disease.  CANCER SCREENING   Lung Cancer  Lung cancer screening is recommended for adults 97-92 years old who are at high risk for lung cancer because of a history of smoking.  A yearly low-dose CT scan of the lungs is recommended for people who:  Currently smoke.  Have quit within the past 15 years.  Have at least a 30-pack-year history of smoking. A pack year is smoking an average of one pack of cigarettes a day for 1 year.  Yearly screening should continue until it has been 15 years since you quit.  Yearly screening should stop if you develop a health problem that would prevent you from having lung cancer treatment.  Breast Cancer  Practice breast self-awareness. This means understanding how your breasts normally appear and feel.  It also means doing regular breast self-exams. Let your health care provider know about any changes, no matter how small.  If you are in your 20s or 30s, you should have a clinical breast exam (CBE) by a health care provider every 1-3 years as part of a regular health exam.  If you are 76 or older, have a CBE every year. Also consider having a breast X-ray (mammogram) every year.  If you have a family history of breast cancer, talk to your health care provider about genetic screening.  If you are  at high risk for breast cancer, talk to your health care provider about having an MRI and a mammogram every year.  Breast cancer gene (BRCA) assessment is recommended for women who have family members with BRCA-related cancers. BRCA-related cancers include:  Breast.  Ovarian.  Tubal.  Peritoneal cancers.  Results of the assessment will determine the need for genetic counseling and BRCA1 and BRCA2 testing. Cervical Cancer Routine pelvic examinations to screen for cervical cancer are no longer recommended for nonpregnant women who are considered low  risk for cancer of the pelvic organs (ovaries, uterus, and vagina) and who do not have symptoms. A pelvic examination may be necessary if you have symptoms including those associated with pelvic infections. Ask your health care provider if a screening pelvic exam is right for you.   The Pap test is the screening test for cervical cancer for women who are considered at risk.  If you had a hysterectomy for a problem that was not cancer or a condition that could lead to cancer, then you no longer need Pap tests.  If you are older than 65 years, and you have had normal Pap tests for the past 10 years, you no longer need to have Pap tests.  If you have had past treatment for cervical cancer or a condition that could lead to cancer, you need Pap tests and screening for cancer for at least 20 years after your treatment.  If you no longer get a Pap test, assess your risk factors if they change (such as having a new sexual partner). This can affect whether you should start being screened again.  Some women have medical problems that increase their chance of getting cervical cancer. If this is the case for you, your health care provider may recommend more frequent screening and Pap tests.  The human papillomavirus (HPV) test is another test that may be used for cervical cancer screening. The HPV test looks for the virus that can cause cell changes in the cervix. The cells collected during the Pap test can be tested for HPV.  The HPV test can be used to screen women 30 years of age and older. Getting tested for HPV can extend the interval between normal Pap tests from three to five years.  An HPV test also should be used to screen women of any age who have unclear Pap test results.  After 34 years of age, women should have HPV testing as often as Pap tests.  Colorectal Cancer  This type of cancer can be detected and often prevented.  Routine colorectal cancer screening usually begins at 34 years of  age and continues through 34 years of age.  Your health care provider may recommend screening at an earlier age if you have risk factors for colon cancer.  Your health care provider may also recommend using home test kits to check for hidden blood in the stool.  A small camera at the end of a tube can be used to examine your colon directly (sigmoidoscopy or colonoscopy). This is done to check for the earliest forms of colorectal cancer.  Routine screening usually begins at age 50.  Direct examination of the colon should be repeated every 5-10 years through 34 years of age. However, you may need to be screened more often if early forms of precancerous polyps or small growths are found. Skin Cancer  Check your skin from head to toe regularly.  Tell your health care provider about any new moles or changes in   moles, especially if there is a change in a mole's shape or color.  Also tell your health care provider if you have a mole that is larger than the size of a pencil eraser.  Always use sunscreen. Apply sunscreen liberally and repeatedly throughout the day.  Protect yourself by wearing long sleeves, pants, a wide-brimmed hat, and sunglasses whenever you are outside. HEART DISEASE, DIABETES, AND HIGH BLOOD PRESSURE   Have your blood pressure checked at least every 1-2 years. High blood pressure causes heart disease and increases the risk of stroke.  If you are between 75 years and 42 years old, ask your health care provider if you should take aspirin to prevent strokes.  Have regular diabetes screenings. This involves taking a blood sample to check your fasting blood sugar level.  If you are at a normal weight and have a low risk for diabetes, have this test once every three years after 34 years of age.  If you are overweight and have a high risk for diabetes, consider being tested at a younger age or more often. PREVENTING INFECTION  Hepatitis B  If you have a higher risk for  hepatitis B, you should be screened for this virus. You are considered at high risk for hepatitis B if:  You were born in a country where hepatitis B is common. Ask your health care provider which countries are considered high risk.  Your parents were born in a high-risk country, and you have not been immunized against hepatitis B (hepatitis B vaccine).  You have HIV or AIDS.  You use needles to inject street drugs.  You live with someone who has hepatitis B.  You have had sex with someone who has hepatitis B.  You get hemodialysis treatment.  You take certain medicines for conditions, including cancer, organ transplantation, and autoimmune conditions. Hepatitis C  Blood testing is recommended for:  Everyone born from [birth year] through 1965.  Anyone with known risk factors for hepatitis C. Sexually transmitted infections (STIs)  You should be screened for sexually transmitted infections (STIs) including gonorrhea and chlamydia if:  You are sexually active and are younger than 34 years of age.  You are older than 34 years of age and your health care provider tells you that you are at risk for this type of infection.  Your sexual activity has changed since you were last screened and you are at an increased risk for chlamydia or gonorrhea. Ask your health care provider if you are at risk.  If you do not have HIV, but are at risk, it may be recommended that you take a prescription medicine daily to prevent HIV infection. This is called pre-exposure prophylaxis (PrEP). You are considered at risk if:  You are sexually active and do not regularly use condoms or know the HIV status of your partner(s).  You take drugs by injection.  You are sexually active with a partner who has HIV. Talk with your health care provider about whether you are at high risk of being infected with HIV. If you choose to begin PrEP, you should first be tested for HIV. You should then be tested every 3 months for  as long as you are taking PrEP.  PREGNANCY   If you are premenopausal and you may become pregnant, ask your health care provider about preconception counseling.  If you may become pregnant, take 400 to 800 micrograms (mcg) of folic acid every day.  If you want to prevent pregnancy, talk to your  health care provider about birth control (contraception). OSTEOPOROSIS AND MENOPAUSE   Osteoporosis is a disease in which the bones lose minerals and strength with aging. This can result in serious bone fractures. Your risk for osteoporosis can be identified using a bone density scan.  If you are 38 years of age or older, or if you are at risk for osteoporosis and fractures, ask your health care provider if you should be screened.  Ask your health care provider whether you should take a calcium or vitamin D supplement to lower your risk for osteoporosis.  Menopause may have certain physical symptoms and risks.  Hormone replacement therapy may reduce some of these symptoms and risks. Talk to your health care provider about whether hormone replacement therapy is right for you.  HOME CARE INSTRUCTIONS   Schedule regular health, dental, and eye exams.  Stay current with your immunizations.   Do not use any tobacco products including cigarettes, chewing tobacco, or electronic cigarettes.  If you are pregnant, do not drink alcohol.  If you are breastfeeding, limit how much and how often you drink alcohol.  Limit alcohol intake to no more than 1 drink per day for nonpregnant women. One drink equals 12 ounces of beer, 5 ounces of wine, or 1 ounces of hard liquor.  Do not use street drugs.  Do not share needles.  Ask your health care provider for help if you need support or information about quitting drugs.  Tell your health care provider if you often feel depressed.  Tell your health care provider if you have ever been abused or do not feel safe at home. Document Released: 06/21/2011  Document Revised: 04/22/2014 Document Reviewed: 11/07/2013 [facility] Patient Information 2015 [place], [place]. This information is not intended to replace advice given to you by your health care provider. Make sure you discuss any questions you have with your health care provider. DASH Eating Plan DASH stands for "Dietary Approaches to Stop Hypertension." The DASH eating plan is a healthy eating plan that has been shown to reduce high blood pressure (hypertension). Additional health benefits may include reducing the risk of type 2 diabetes mellitus, heart disease, and stroke. The DASH eating plan may also help with weight loss. WHAT DO I NEED TO KNOW ABOUT THE DASH EATING PLAN? For the DASH eating plan, you will follow these general guidelines:  Choose foods with a percent daily value for sodium of less than 5% (as listed on the food label).  Use salt-free seasonings or herbs instead of table salt or sea salt.  Check with your health care provider or pharmacist before using salt substitutes.  Eat lower-sodium products, often labeled as "lower sodium" or "no salt added."  Eat fresh foods.  Eat more vegetables, fruits, and low-fat dairy products.  Choose whole grains. Look for the word "whole" as the first word in the ingredient list.  Choose fish and skinless chicken or [place] more often than red meat. Limit fish, poultry, and meat to 6 oz (170 g) each day.  Limit sweets, desserts, sugars, and sugary drinks.  Choose heart-healthy fats.  Limit cheese to 1 oz (28 g) per day.  Eat more home-cooked food and less restaurant, buffet, and fast food.  Limit fried foods.  Cook foods using methods other than frying.  Limit canned vegetables. If you do use them, rinse them well to decrease the sodium.  When eating at a restaurant, ask that your food be prepared with less salt, or no salt if possible.  WHAT FOODS CAN I EAT? Seek help from a dietitian for individual calorie  needs. Grains Whole grain or whole wheat bread. Brown rice. Whole grain or whole wheat pasta. Quinoa, bulgur, and whole grain cereals. Low-sodium cereals. Corn or whole wheat flour tortillas. Whole grain cornbread. Whole grain crackers. Low-sodium crackers. Vegetables Fresh or frozen vegetables (raw, steamed, roasted, or grilled). Low-sodium or reduced-sodium tomato and vegetable juices. Low-sodium or reduced-sodium tomato sauce and paste. Low-sodium or reduced-sodium canned vegetables.  Fruits All fresh, canned (in natural juice), or frozen fruits. Meat and Other Protein Products Ground beef (85% or leaner), grass-fed beef, or beef trimmed of fat. Skinless chicken or [place]. Ground chicken or [place]. Pork trimmed of fat. All fish and seafood. Eggs. Dried beans, peas, or lentils. Unsalted nuts and seeds. Unsalted canned beans. Dairy Low-fat dairy products, such as skim or 1% milk, 2% or reduced-fat cheeses, low-fat ricotta or cottage cheese, or plain low-fat yogurt. Low-sodium or reduced-sodium cheeses. Fats and Oils Tub margarines without trans fats. Light or reduced-fat mayonnaise and salad dressings (reduced sodium). Avocado. Safflower, olive, or canola oils. Natural peanut or almond butter. Other Unsalted popcorn and pretzels. The items listed above may not be a complete list of recommended foods or beverages. Contact your dietitian for more options. WHAT FOODS ARE NOT RECOMMENDED? Grains Dokes bread. Bodenheimer pasta. Neyer rice. Refined cornbread. Bagels and croissants. Crackers that contain trans fat. Vegetables Creamed or fried vegetables. Vegetables in a cheese sauce. Regular canned vegetables. Regular canned tomato sauce and paste. Regular tomato and vegetable juices. Fruits Dried fruits. Canned fruit in light or heavy syrup. Fruit juice. Meat and Other Protein Products Fatty cuts of meat. Ribs, chicken wings, bacon, sausage, bologna, salami, chitterlings, fatback, hot dogs, bratwurst,  and packaged luncheon meats. Salted nuts and seeds. Canned beans with salt. Dairy Whole or 2% milk, cream, half-and-half, and cream cheese. Whole-fat or sweetened yogurt. Full-fat cheeses or blue cheese. Nondairy creamers and whipped toppings. Processed cheese, cheese spreads, or cheese curds. Condiments Onion and garlic salt, seasoned salt, table salt, and sea salt. Canned and packaged gravies. Worcestershire sauce. Tartar sauce. Barbecue sauce. Teriyaki sauce. Soy sauce, including reduced sodium. Steak sauce. Fish sauce. Oyster sauce. Cocktail sauce. Horseradish. Ketchup and mustard. Meat flavorings and tenderizers. Bouillon cubes. Hot sauce. Tabasco sauce. Marinades. Taco seasonings. Relishes. Fats and Oils Butter, stick margarine, lard, shortening, ghee, and bacon fat. Coconut, palm kernel, or palm oils. Regular salad dressings. Other Pickles and olives. Salted popcorn and pretzels. The items listed above may not be a complete list of foods and beverages to avoid. Contact your dietitian for more information. WHERE CAN I FIND MORE INFORMATION? National Heart, Lung, and Blood Institute: travelstabloid.com Document Released: 11/25/2011 Document Revised: 04/22/2014 Document Reviewed: 10/10/2013 [facility] Patient Information 2015 [place], [place]. This information is not intended to replace advice given to you by your health care provider. Make sure you discuss any questions you have with your health care provider. Hypertension Hypertension, commonly called high blood pressure, is when the force of blood pumping through your arteries is too strong. Your arteries are the blood vessels that carry blood from your heart throughout your body. A blood pressure reading consists of a higher number over a lower number, such as 110/72. The higher number (systolic) is the pressure inside your arteries when your heart pumps. The lower number (diastolic) is the pressure inside your  arteries when your heart relaxes. Ideally you want your blood pressure below 120/80. Hypertension forces your heart to work harder  to pump blood. Your arteries may become narrow or stiff. Having hypertension puts you at risk for heart disease, stroke, and other problems.  RISK FACTORS Some risk factors for high blood pressure are controllable. Others are not.  Risk factors you cannot control include:   Race. You may be at higher risk if you are African American.  Age. Risk increases with age.  Gender. Men are at higher risk than women before age 45 years. After age 65, women are at higher risk than men. Risk factors you can control include:  Not getting enough exercise or physical activity.  Being overweight.  Getting too much fat, sugar, calories, or salt in your diet.  Drinking too much alcohol. SIGNS AND SYMPTOMS Hypertension does not usually cause signs or symptoms. Extremely high blood pressure (hypertensive crisis) may cause headache, anxiety, shortness of breath, and nosebleed. DIAGNOSIS  To check if you have hypertension, your health care provider will measure your blood pressure while you are seated, with your arm held at the level of your heart. It should be measured at least twice using the same arm. Certain conditions can cause a difference in blood pressure between your right and left arms. A blood pressure reading that is higher than normal on one occasion does not mean that you need treatment. If one blood pressure reading is high, ask your health care provider about having it checked again. TREATMENT  Treating high blood pressure includes making lifestyle changes and possibly taking medicine. Living a healthy lifestyle can help lower high blood pressure. You may need to change some of your habits. Lifestyle changes may include:  Following the DASH diet. This diet is high in fruits, vegetables, and whole grains. It is low in salt, red meat, and added sugars.  Getting at  least 2 hours of brisk physical activity every week.  Losing weight if necessary.  Not smoking.  Limiting alcoholic beverages.  Learning ways to reduce stress. If lifestyle changes are not enough to get your blood pressure under control, your health care provider may prescribe medicine. You may need to take more than one. Work closely with your health care provider to understand the risks and benefits. HOME CARE INSTRUCTIONS  Have your blood pressure rechecked as directed by your health care provider.   Take medicines only as directed by your health care provider. Follow the directions carefully. Blood pressure medicines must be taken as prescribed. The medicine does not work as well when you skip doses. Skipping doses also puts you at risk for problems.   Do not smoke.   Monitor your blood pressure at home as directed by your health care provider. SEEK MEDICAL CARE IF:   You think you are having a reaction to medicines taken.  You have recurrent headaches or feel dizzy.  You have swelling in your ankles.  You have trouble with your vision. SEEK IMMEDIATE MEDICAL CARE IF:  You develop a severe headache or confusion.  You have unusual weakness, numbness, or feel faint.  You have severe chest or abdominal pain.  You vomit repeatedly.  You have trouble breathing. MAKE SURE YOU:   Understand these instructions.  Will watch your condition.  Will get help right away if you are not doing well or get worse. Document Released: 12/06/2005 Document Revised: 04/22/2014 Document Reviewed: 09/28/2013 [employer] Patient Information 2015 [employer]. This information is not intended to replace advice given to you by your health care provider. Make sure you discuss any questions you have with   your health care provider.

## 2015-06-18 NOTE — Progress Notes (Signed)
Subjective:    Patient ID: Amber Colon, female    DOB: 1981/01/21, 34 y.o.   MRN: [id number]  Pt presents to the office today to establish care. Pt states she has been told her BP was high the past but she has never been on medication for her HTN.  Hypertension This is a new problem. The problem is unchanged. The problem is uncontrolled. Associated symptoms include anxiety and malaise/fatigue. Pertinent negatives include no headaches, palpitations, peripheral edema or shortness of breath. Risk factors for coronary artery disease include obesity, family history, sedentary lifestyle, smoking/tobacco exposure and stress. Past treatments include nothing. The current treatment provides no improvement. Hypertensive end-organ damage includes a thyroid problem. There is no history of kidney disease, CAD/MI, CVA or heart failure.  Thyroid Problem Presents for follow-up visit. Symptoms include anxiety, constipation, depressed mood, diarrhea and dry skin. Patient reports no palpitations. The symptoms have been worsening. Past treatments include levothyroxine. The treatment provided mild relief. There is no history of heart failure.  Anemia Presents for follow-up visit. Symptoms include light-headedness and malaise/fatigue. There has been no palpitations. Past treatments include oral iron supplements. There is no history of heart failure.  Ear Drainage  There is pain in the left ear. This is a chronic problem. The current episode started more than 1 month ago. The problem occurs constantly. The problem has been unchanged. There has been no fever. Associated symptoms include diarrhea, ear discharge and hearing loss. Pertinent negatives include no headaches. She has tried ear drops for the symptoms. The treatment provided mild relief. Her past medical history is significant for a chronic ear infection.      Review of Systems  Constitutional: Positive for malaise/fatigue.  HENT: Positive for ear discharge  and hearing loss.   Eyes: Negative.   Respiratory: Negative.  Negative for shortness of breath.   Cardiovascular: Negative.  Negative for palpitations.  Gastrointestinal: Positive for diarrhea and constipation.  Endocrine: Negative.   Genitourinary: Negative.   Musculoskeletal: Negative.   Neurological: Positive for light-headedness. Negative for headaches.  Hematological: Negative.   Psychiatric/Behavioral: The patient is nervous/anxious.   All other systems reviewed and are negative.      Objective:   Physical Exam  Constitutional: She is oriented to person, place, and time. She appears well-developed and well-nourished. No distress.  HENT:  Head: Normocephalic and atraumatic.  Right Ear: There is drainage (yellow brownish) and tenderness. Tympanic membrane is perforated.  Mouth/Throat: Oropharynx is clear and moist.  Eyes: Pupils are equal, round, and reactive to light.  Neck: Normal range of motion. Neck supple. No thyromegaly present.  Cardiovascular: Normal rate, regular rhythm, normal heart sounds and intact distal pulses.   No murmur heard. Pulmonary/Chest: Effort normal and breath sounds normal. No respiratory distress. She has no wheezes.  Abdominal: Soft. Bowel sounds are normal. She exhibits no distension. There is no tenderness.  Musculoskeletal: Normal range of motion. She exhibits no edema or tenderness.  Neurological: She is alert and oriented to person, place, and time. She has normal reflexes. No cranial nerve deficit.  Skin: Skin is warm and dry.  Psychiatric: She has a normal mood and affect. Her behavior is normal. Judgment and thought content normal.  Vitals reviewed.   BP 132/102 mmHg  Pulse 83  Temp(Src) 97.6 F (36.4 C) (Oral)  Ht '5\' 6"'  (1.676 m)  Wt 343 lb 3.2 oz (155.674 kg)  BMI 55.42 kg/m2  LMP 06/18/2015       Assessment & Plan:  1. Hypothyroidism, unspecified hypothyroidism type - CMP14+EGFR - Thyroid Panel With TSH  2. Anemia, iron  deficiency - Anemia Profile B - CMP14+EGFR  3. Essential hypertension -Pt started on HCTZ 12.5 mg today -Daily blood pressure log given with instructions on how to fill out and told to bring to next visit -Dash diet information given -Exercise encouraged - Stress Management  -Continue current meds -RTO in 2 weeks - CMP14+EGFR - hydrochlorothiazide (HYDRODIURIL) 12.5 MG tablet; Take 1 tablet (12.5 mg total) by mouth daily.  Dispense: 90 tablet; Refill: 3  4. Acute right otitis media, recurrence not specified, unspecified otitis media type - CMP14+EGFR - amoxicillin (AMOXIL) 875 MG tablet; Take 1 tablet (875 mg total) by mouth 2 (two) times daily.  Dispense: 20 tablet; Refill: 0   Continue all meds Labs pending Health Maintenance reviewed Diet and exercise encouraged RTO 2 weeks to recheck HTN  Evelina Dun, FNP

## 2015-06-19 ENCOUNTER — Telehealth: Payer: Self-pay | Admitting: Family

## 2015-06-19 ENCOUNTER — Other Ambulatory Visit: Payer: Self-pay | Admitting: Family

## 2015-06-19 LAB — ANEMIA PROFILE B
BASOS ABS: 0.1 10*3/uL (ref 0.0–0.2)
BASOS: 1 %
EOS (ABSOLUTE): 0.7 10*3/uL — AB (ref 0.0–0.4)
Eos: 10 %
FERRITIN: 18 ng/mL (ref 15–150)
Folate: 4.9 ng/mL (ref >3.0)
HEMATOCRIT: 39.7 % (ref 34.0–46.6)
Hemoglobin: 12.8 g/dL (ref 11.1–15.9)
IRON: 33 ug/dL (ref 27–159)
Immature Grans (Abs): 0 10*3/uL (ref 0.0–0.1)
Immature Granulocytes: 0 %
Iron Saturation: 11 % — ABNORMAL LOW (ref 15–55)
LYMPHS ABS: 2.2 10*3/uL (ref 0.7–3.1)
LYMPHS: 31 %
MCH: 26.3 pg — AB (ref 26.6–33.0)
MCHC: 32.2 g/dL (ref 31.5–35.7)
MCV: 82 fL (ref 79–97)
MONOCYTES: 7 %
MONOS ABS: 0.5 10*3/uL (ref 0.1–0.9)
Neutrophils Absolute: 3.6 10*3/uL (ref 1.4–7.0)
Neutrophils: 51 %
PLATELETS: 219 10*3/uL (ref 150–379)
RBC: 4.87 x10E6/uL (ref 3.77–5.28)
RDW: 18.2 % — AB (ref 12.3–15.4)
RETIC CT PCT: 0.8 % (ref 0.6–2.6)
Total Iron Binding Capacity: 312 ug/dL (ref 250–450)
UIBC: 279 ug/dL (ref 131–425)
VITAMIN B 12: 255 pg/mL (ref 211–946)
WBC: 7 10*3/uL (ref 3.4–10.8)

## 2015-06-19 LAB — THYROID PANEL WITH TSH
FREE THYROXINE INDEX: 2.3 (ref 1.2–4.9)
T3 Uptake Ratio: 28 % (ref 24–39)
T4 TOTAL: 8.1 ug/dL (ref 4.5–12.0)
TSH: 4.97 u[IU]/mL — AB (ref 0.450–4.500)

## 2015-06-19 LAB — CMP14+EGFR
ALK PHOS: 70 IU/L (ref 39–117)
ALT: 16 IU/L (ref 0–32)
AST: 14 IU/L (ref 0–40)
Albumin/Globulin Ratio: 1.5 (ref 1.1–2.5)
Albumin: 4.4 g/dL (ref 3.5–5.5)
BILIRUBIN TOTAL: 0.3 mg/dL (ref 0.0–1.2)
BUN/Creatinine Ratio: 8 (ref 8–20)
BUN: 8 mg/dL (ref 6–20)
CHLORIDE: 104 mmol/L (ref 97–108)
CO2: 22 mmol/L (ref 18–29)
CREATININE: 0.96 mg/dL (ref 0.57–1.00)
Calcium: 9.7 mg/dL (ref 8.7–10.2)
GFR, EST AFRICAN AMERICAN: 90 mL/min/{1.73_m2} (ref >59)
GFR, EST NON AFRICAN AMERICAN: 78 mL/min/{1.73_m2} (ref >59)
GLOBULIN, TOTAL: 3 g/dL (ref 1.5–4.5)
GLUCOSE: 108 mg/dL — AB (ref 65–99)
Potassium: 4.4 mmol/L (ref 3.5–5.2)
Sodium: 142 mmol/L (ref 134–144)
Total Protein: 7.4 g/dL (ref 6.0–8.5)

## 2015-06-19 MED ORDER — LEVOTHYROXINE SODIUM 50 MCG PO TABS: 50.0000 ug | ORAL_TABLET | Freq: Every day | ORAL | Status: DC

## 2015-06-19 MED ORDER — CIPROFLOXACIN HCL 0.2 % OT SOLN
0.2000 mL | Freq: Two times a day (BID) | OTIC | Status: DC
Start: 2015-06-19 — End: 2015-07-02

## 2015-06-19 NOTE — Telephone Encounter (Signed)
The ear you checked yesterday is really hurting.  Could you please send in an rx for ear drops to relieve the pain?

## 2015-06-19 NOTE — Telephone Encounter (Signed)
Aware of results. 

## 2015-06-19 NOTE — Telephone Encounter (Signed)
Prescription sent to pharmacy.

## 2015-06-19 NOTE — Addendum Note (Signed)
Addended by: Jannifer RodneyHAWKS, CHRISTY A on: 06/19/2015 03:10 PM   Modules accepted: Orders

## 2015-06-19 NOTE — Telephone Encounter (Signed)
-----   Message from Junie Spencerhristy A Hawks, FNP sent at 06/19/2015  8:26 AM EDT ----- Anemia Profile ( Vitamin B12, Folate, WBC, Hgb, & Plts)- WNL, Iron levels low- Pt needs to be on 325 mg ferrous sulfate daily with daily stool softener Kidney and liver function stable Thyroid levels abnormal- Levothyroxine increased to 50 mcg

## 2015-06-20 ENCOUNTER — Ambulatory Visit: Payer: Self-pay | Admitting: Family

## 2015-06-24 ENCOUNTER — Telehealth: Payer: Self-pay

## 2015-06-24 MED ORDER — NEOMYCIN-POLYMYXIN-HC 3.5-10000-1 OT SOLN: 3.0000 [drp] | Freq: Four times a day (QID) | OTIC | Status: DC

## 2015-06-24 NOTE — Telephone Encounter (Signed)
Prescription sent to pharmacy.

## 2015-06-24 NOTE — Telephone Encounter (Signed)
Medicaid non preferred Ciprofloxacin HCL    Preferred are: Ciprodex susp. And Neomycin-polymyxin-hydrocortisone solution

## 2015-07-02 ENCOUNTER — Ambulatory Visit (INDEPENDENT_AMBULATORY_CARE_PROVIDER_SITE_OTHER): Payer: Medicaid Other | Admitting: Family

## 2015-07-02 ENCOUNTER — Encounter: Payer: Self-pay | Admitting: Family

## 2015-07-02 VITALS — BP 138/103 | HR 87 | Temp 98.3°F | Ht 66.0 in | Wt 335.8 lb

## 2015-07-02 DIAGNOSIS — I10 Essential (primary) hypertension: Secondary | ICD-10-CM | POA: Diagnosis not present

## 2015-07-02 DIAGNOSIS — H60393 Other infective otitis externa, bilateral: Secondary | ICD-10-CM | POA: Diagnosis not present

## 2015-07-02 MED ORDER — LISINOPRIL-HYDROCHLOROTHIAZIDE 20-12.5 MG PO TABS: 1.0000 | ORAL_TABLET | Freq: Every day | ORAL | Status: DC

## 2015-07-02 NOTE — Patient Instructions (Addendum)
Hypertension Hypertension, commonly called high blood pressure, is when the force of blood pumping through your arteries is too strong. Your arteries are the blood vessels that carry blood from your heart throughout your body. A blood pressure reading consists of a higher number over a lower number, such as 110/72. The higher number (systolic) is the pressure inside your arteries when your heart pumps. The lower number (diastolic) is the pressure inside your arteries when your heart relaxes. Ideally you want your blood pressure below 120/80. Hypertension forces your heart to work harder to pump blood. Your arteries may become narrow or stiff. Having hypertension puts you at risk for heart disease, stroke, and other problems.  RISK FACTORS Some risk factors for high blood pressure are controllable. Others are not.  Risk factors you cannot control include:   Race. You may be at higher risk if you are African American.  Age. Risk increases with age.  Gender. Men are at higher risk than women before age 45 years. After age 65, women are at higher risk than men. Risk factors you can control include:  Not getting enough exercise or physical activity.  Being overweight.  Getting too much fat, sugar, calories, or salt in your diet.  Drinking too much alcohol. SIGNS AND SYMPTOMS Hypertension does not usually cause signs or symptoms. Extremely high blood pressure (hypertensive crisis) may cause headache, anxiety, shortness of breath, and nosebleed. DIAGNOSIS  To check if you have hypertension, your health care provider will measure your blood pressure while you are seated, with your arm held at the level of your heart. It should be measured at least twice using the same arm. Certain conditions can cause a difference in blood pressure between your right and left arms. A blood pressure reading that is higher than normal on one occasion does not mean that you need treatment. If one blood pressure reading  is high, ask your health care provider about having it checked again. TREATMENT  Treating high blood pressure includes making lifestyle changes and possibly taking medicine. Living a healthy lifestyle can help lower high blood pressure. You may need to change some of your habits. Lifestyle changes may include:  Following the DASH diet. This diet is high in fruits, vegetables, and whole grains. It is low in salt, red meat, and added sugars.  Getting at least 2 hours of brisk physical activity every week.  Losing weight if necessary.  Not smoking.  Limiting alcoholic beverages.  Learning ways to reduce stress. If lifestyle changes are not enough to get your blood pressure under control, your health care provider may prescribe medicine. You may need to take more than one. Work closely with your health care provider to understand the risks and benefits. HOME CARE INSTRUCTIONS  Have your blood pressure rechecked as directed by your health care provider.   Take medicines only as directed by your health care provider. Follow the directions carefully. Blood pressure medicines must be taken as prescribed. The medicine does not work as well when you skip doses. Skipping doses also puts you at risk for problems.   Do not smoke.   Monitor your blood pressure at home as directed by your health care provider. SEEK MEDICAL CARE IF:   You think you are having a reaction to medicines taken.  You have recurrent headaches or feel dizzy.  You have swelling in your ankles.  You have trouble with your vision. SEEK IMMEDIATE MEDICAL CARE IF:  You develop a severe headache or confusion.    You have unusual weakness, numbness, or feel faint.  You have severe chest or abdominal pain.  You vomit repeatedly.  You have trouble breathing. MAKE SURE YOU:   Understand these instructions.  Will watch your condition.  Will get help right away if you are not doing well or get worse. Document  Released: 12/06/2005 Document Revised: 04/22/2014 Document Reviewed: 09/28/2013 ExitCare Patient Information 2015 [employer]. This information is not intended to replace advice given to you by your health care provider. Make sure you discuss any questions you have with your health care provider. DASH Eating Plan DASH stands for "Dietary Approaches to Stop Hypertension." The DASH eating plan is a healthy eating plan that has been shown to reduce high blood pressure (hypertension). Additional health benefits may include reducing the risk of type 2 diabetes mellitus, heart disease, and stroke. The DASH eating plan may also help with weight loss. WHAT DO I NEED TO KNOW ABOUT THE DASH EATING PLAN? For the DASH eating plan, you will follow these general guidelines:  Choose foods with a percent daily value for sodium of less than 5% (as listed on the food label).  Use salt-free seasonings or herbs instead of table salt or sea salt.  Check with your health care provider or pharmacist before using salt substitutes.  Eat lower-sodium products, often labeled as "lower sodium" or "no salt added."  Eat fresh foods.  Eat more vegetables, fruits, and low-fat dairy products.  Choose whole grains. Look for the word "whole" as the first word in the ingredient list.  Choose fish and skinless chicken or turkey more often than red meat. Limit fish, poultry, and meat to 6 oz (170 g) each day.  Limit sweets, desserts, sugars, and sugary drinks.  Choose heart-healthy fats.  Limit cheese to 1 oz (28 g) per day.  Eat more home-cooked food and less restaurant, buffet, and fast food.  Limit fried foods.  Cook foods using methods other than frying.  Limit canned vegetables. If you do use them, rinse them well to decrease the sodium.  When eating at a restaurant, ask that your food be prepared with less salt, or no salt if possible. WHAT FOODS CAN I EAT? Seek help from a dietitian for individual  calorie needs. Grains Whole grain or whole wheat bread. Brown rice. Whole grain or whole wheat pasta. Quinoa, bulgur, and whole grain cereals. Low-sodium cereals. Corn or whole wheat flour tortillas. Whole grain cornbread. Whole grain crackers. Low-sodium crackers. Vegetables Fresh or frozen vegetables (raw, steamed, roasted, or grilled). Low-sodium or reduced-sodium tomato and vegetable juices. Low-sodium or reduced-sodium tomato sauce and paste. Low-sodium or reduced-sodium canned vegetables.  Fruits All fresh, canned (in natural juice), or frozen fruits. Meat and Other Protein Products Ground beef (85% or leaner), grass-fed beef, or beef trimmed of fat. Skinless chicken or turkey. Ground chicken or turkey. Pork trimmed of fat. All fish and seafood. Eggs. Dried beans, peas, or lentils. Unsalted nuts and seeds. Unsalted canned beans. Dairy Low-fat dairy products, such as skim or 1% milk, 2% or reduced-fat cheeses, low-fat ricotta or cottage cheese, or plain low-fat yogurt. Low-sodium or reduced-sodium cheeses. Fats and Oils Tub margarines without trans fats. Light or reduced-fat mayonnaise and salad dressings (reduced sodium). Avocado. Safflower, olive, or canola oils. Natural peanut or almond butter. Other Unsalted popcorn and pretzels. The items listed above may not be a complete list of recommended foods or beverages. Contact your dietitian for more options. WHAT FOODS ARE NOT RECOMMENDED? Grains White bread.   White pasta. White rice. Refined cornbread. Bagels and croissants. Crackers that contain trans fat. Vegetables Creamed or fried vegetables. Vegetables in a cheese sauce. Regular canned vegetables. Regular canned tomato sauce and paste. Regular tomato and vegetable juices. Fruits Dried fruits. Canned fruit in light or heavy syrup. Fruit juice. Meat and Other Protein Products Fatty cuts of meat. Ribs, chicken wings, bacon, sausage, bologna, salami, chitterlings, fatback, hot dogs,  bratwurst, and packaged luncheon meats. Salted nuts and seeds. Canned beans with salt. Dairy Whole or 2% milk, cream, half-and-half, and cream cheese. Whole-fat or sweetened yogurt. Full-fat cheeses or blue cheese. Nondairy creamers and whipped toppings. Processed cheese, cheese spreads, or cheese curds. Condiments Onion and garlic salt, seasoned salt, table salt, and sea salt. Canned and packaged gravies. Worcestershire sauce. Tartar sauce. Barbecue sauce. Teriyaki sauce. Soy sauce, including reduced sodium. Steak sauce. Fish sauce. Oyster sauce. Cocktail sauce. Horseradish. Ketchup and mustard. Meat flavorings and tenderizers. Bouillon cubes. Hot sauce. Tabasco sauce. Marinades. Taco seasonings. Relishes. Fats and Oils Butter, stick margarine, lard, shortening, ghee, and bacon fat. Coconut, palm kernel, or palm oils. Regular salad dressings. Other Pickles and olives. Salted popcorn and pretzels. The items listed above may not be a complete list of foods and beverages to avoid. Contact your dietitian for more information. WHERE CAN I FIND MORE INFORMATION? National Heart, Lung, and [employer] Institute: www.nhlbi.nih.gov/health/health-topics/topics/dash/ Document Released: 11/25/2011 Document Revised: 04/22/2014 Document Reviewed: 10/10/2013 ExitCare Patient Information 2015 [employer]. This information is not intended to replace advice given to you by your health care provider. Make sure you discuss any questions you have with your health care provider.  

## 2015-07-02 NOTE — Progress Notes (Signed)
   Subjective:    Patient ID: Amber Colon, female    DOB: [birth year], 34 y.o.   MRN: [id number]  Pt presents to the office to recheck HTN. Pt's BP is not at goal today. Hypertension This is a new problem. The current episode started 1 to 4 weeks ago. The problem has been waxing and waning since onset. The problem is uncontrolled. Pertinent negatives include no anxiety, headaches, palpitations, peripheral edema or shortness of breath. Past treatments include diuretics. The current treatment provides mild improvement. Hypertensive end-organ damage includes a thyroid problem. There is no history of kidney disease, CAD/MI, CVA or heart failure. There is no history of sleep apnea.      Review of Systems  Constitutional: Negative.   HENT: Negative.   Eyes: Negative.   Respiratory: Negative.  Negative for shortness of breath.   Cardiovascular: Negative.  Negative for palpitations.  Gastrointestinal: Negative.   Endocrine: Negative.   Genitourinary: Negative.   Musculoskeletal: Negative.   Neurological: Negative.  Negative for headaches.  Hematological: Negative.   Psychiatric/Behavioral: Negative.   All other systems reviewed and are negative.      Objective:   Physical Exam  Constitutional: She is oriented to person, place, and time. She appears well-developed and well-nourished. No distress.  HENT:  Head: Normocephalic and atraumatic.  Right Ear: External ear normal.  Left Ear: External ear normal.  Mouth/Throat: Oropharynx is clear and moist.  Eyes: Pupils are equal, round, and reactive to light.  Neck: Normal range of motion. Neck supple. No thyromegaly present.  Cardiovascular: Normal rate, regular rhythm, normal heart sounds and intact distal pulses.   No murmur heard. Pulmonary/Chest: Effort normal and breath sounds normal. No respiratory distress. She has no wheezes.  Abdominal: Soft. Bowel sounds are normal. She exhibits no distension. There is no tenderness.    Musculoskeletal: Normal range of motion. She exhibits no edema or tenderness.  Neurological: She is alert and oriented to person, place, and time. She has normal reflexes. No cranial nerve deficit.  Skin: Skin is warm and dry.  Psychiatric: She has a normal mood and affect. Her behavior is normal. Judgment and thought content normal.  Vitals reviewed.     BP 138/103 mmHg  Pulse 87  Temp(Src) 98.3 F (36.8 C) (Oral)  Ht _0  (1.676 m)  Wt 335 lb 12.8 oz (152.318 kg)  BMI 54.23 kg/m2  LMP 06/18/2015     Assessment & Plan:  1. Essential hypertension -Add lisinopril 32m to pt's HCTZ today -Dash diet information given -Exercise encouraged - Stress Management  -Continue current meds -RTO in 2 weeks - BMP8+EGFR - lisinopril-hydrochlorothiazide (ZESTORETIC) 20-12.5 MG per tablet; Take 1 tablet by mouth daily.  Dispense: 90 tablet; Refill: 3   2. Otitis, externa, infective, bilateral - Ambulatory referral to ENT   CEvelina Dun FNP

## 2015-07-03 LAB — BMP8+EGFR
BUN/Creatinine Ratio: 9 (ref 8–20)
BUN: 9 mg/dL (ref 6–20)
CO2: 23 mmol/L (ref 18–29)
CREATININE: 0.96 mg/dL (ref 0.57–1.00)
Calcium: 10 mg/dL (ref 8.7–10.2)
Chloride: 93 mmol/L — ABNORMAL LOW (ref 97–108)
GFR calc Af Amer: 90 mL/min/{1.73_m2} (ref >59)
GFR calc non Af Amer: 78 mL/min/{1.73_m2} (ref >59)
Glucose: 105 mg/dL — ABNORMAL HIGH (ref 65–99)
Potassium: 3.7 mmol/L (ref 3.5–5.2)
SODIUM: 137 mmol/L (ref 134–144)

## 2015-07-04 ENCOUNTER — Encounter: Payer: Self-pay | Admitting: *Deleted

## 2015-07-16 ENCOUNTER — Ambulatory Visit: Payer: Medicaid Other | Admitting: Family

## 2015-07-22 ENCOUNTER — Encounter: Payer: Self-pay | Admitting: Family

## 2015-07-22 ENCOUNTER — Ambulatory Visit (INDEPENDENT_AMBULATORY_CARE_PROVIDER_SITE_OTHER): Payer: Medicaid Other | Admitting: Family

## 2015-07-22 VITALS — BP 123/87 | HR 63 | Temp 97.8°F | Ht 66.0 in | Wt 340.2 lb

## 2015-07-22 DIAGNOSIS — K589 Irritable bowel syndrome without diarrhea: Secondary | ICD-10-CM

## 2015-07-22 DIAGNOSIS — I10 Essential (primary) hypertension: Secondary | ICD-10-CM

## 2015-07-22 MED ORDER — DICYCLOMINE HCL 20 MG PO TABS: 20.0000 mg | ORAL_TABLET | Freq: Four times a day (QID) | ORAL | Status: DC

## 2015-07-22 NOTE — Patient Instructions (Signed)
Diet and Irritable Bowel Syndrome  No cure has been found for irritable bowel syndrome (IBS). Many options are available to treat the symptoms. Your caregiver will give you the best treatments available for your symptoms. He or she will also encourage you to manage stress and to make changes to your diet. You need to work with your caregiver and Registered Dietician to find the best combination of medicine, diet, counseling, and support to control your symptoms. The following are some diet suggestions. FOODS THAT MAKE IBS WORSE  Fatty foods, such as French fries.  Milk products, such as cheese or ice cream.  Chocolate.  Alcohol.  Caffeine (found in coffee and some sodas).  Carbonated drinks, such as soda. If certain foods cause symptoms, you should eat less of them or stop eating them. FOOD JOURNAL   Keep a journal of the foods that seem to cause distress. Write down:  What you are eating during the day and when.  What problems you are having after eating.  When the symptoms occur in relation to your meals.  What foods always make you feel badly.  Take your notes with you to your caregiver to see if you should stop eating certain foods. FOODS THAT MAKE IBS BETTER Fiber reduces IBS symptoms, especially constipation, because it makes stools soft, bulky, and easier to pass. Fiber is found in bran, bread, cereal, beans, fruit, and vegetables. Examples of foods with fiber include:  Apples.  Peaches.  Pears.  Berries.  Figs.  Broccoli, raw.  Cabbage.  Carrots.  Raw peas.  Kidney beans.  Lima beans.  Whole-grain bread.  Whole-grain cereal. Add foods with fiber to your diet a little at a time. This will let your body get used to them. Too much fiber at once might cause gas and swelling of your abdomen. This can trigger symptoms in a person with IBS. Caregivers usually recommend a diet with enough fiber to produce soft, painless bowel movements. High fiber diets may  cause gas and bloating. However, these symptoms often go away within a few weeks, as your body adjusts. In many cases, dietary fiber may lessen IBS symptoms, particularly constipation. However, it may not help pain or diarrhea. High fiber diets keep the colon mildly enlarged (distended) with the added fiber. This may help prevent spasms in the colon. Some forms of fiber also keep water in the stool, thereby preventing hard stools that are difficult to pass.  Besides telling you to eat more foods with fiber, your caregiver may also tell you to get more fiber by taking a fiber pill or drinking water mixed with a special high fiber powder. An example of this is a natural fiber laxative containing psyllium seed.  TIPS  Large meals can cause cramping and diarrhea in people with IBS. If this happens to you, try eating 4 or 5 small meals a day, or try eating less at each of your usual 3 meals. It may also help if your meals are low in fat and high in carbohydrates. Examples of carbohydrates are pasta, rice, whole-grain breads and cereals, fruits, and vegetables.  If dairy products cause your symptoms to flare up, you can try eating less of those foods. You might be able to handle yogurt better than other dairy products, because it contains bacteria that helps with digestion. Dairy products are an important source of calcium and other nutrients. If you need to avoid dairy products, be sure to talk with a Registered Dietitian about getting these nutrients   through other food sources.  Drink enough water and fluids to keep your urine clear or pale yellow. This is important, especially if you have diarrhea. FOR MORE INFORMATION  International Foundation for Functional Gastrointestinal Disorders: www.iffgd.org  National Digestive Diseases Information Clearinghouse: digestive.niddk.nih.gov Document Released: 02/26/2004 Document Revised: 02/28/2012 Document Reviewed: 03/08/2014 ExitCare Patient Information 2015  [employer]. This information is not intended to replace advice given to you by your health care provider. Make sure you discuss any questions you have with your health care provider. Irritable Bowel Syndrome Irritable bowel syndrome (IBS) is caused by a disturbance of normal bowel function and is a common digestive disorder. You may also hear this condition called spastic colon, mucous colitis, and irritable colon. There is no cure for IBS. However, symptoms often gradually improve or disappear with a good diet, stress management, and medicine. This condition usually appears in late adolescence or early adulthood. Women develop it twice as often as men. CAUSES  After food has been digested and absorbed in the small intestine, waste material is moved into the large intestine, or colon. In the colon, water and salts are absorbed from the undigested products coming from the small intestine. The remaining residue, or fecal material, is held for elimination. Under normal circumstances, gentle, rhythmic contractions of the bowel walls push the fecal material along the colon toward the rectum. In IBS, however, these contractions are irregular and poorly coordinated. The fecal material is either retained too long, resulting in constipation, or expelled too soon, producing diarrhea. SIGNS AND SYMPTOMS  The most common symptom of IBS is abdominal pain. It is often in the lower left side of the abdomen, but it may occur anywhere in the abdomen. The pain comes from spasms of the bowel muscles happening too much and from the buildup of gas and fecal material in the colon. This pain:  Can range from sharp abdominal cramps to a dull, continuous ache.  Often worsens soon after eating.  Is often relieved by having a bowel movement or passing gas. Abdominal pain is usually accompanied by constipation, but it may also produce diarrhea. The diarrhea often occurs right after a meal or upon waking up in the morning. The  stools are often soft, watery, and flecked with mucus. Other symptoms of IBS include:  Bloating.  Loss of appetite.  Heartburn.  Backache.  Dull pain in the arms or shoulders.  Nausea.  Burping.  Vomiting.  Gas. IBS may also cause symptoms that are unrelated to the digestive system, such as:  Fatigue.  Headaches.  Anxiety.  Shortness of breath.  Trouble concentrating.  Dizziness. These symptoms tend to come and go. DIAGNOSIS  The symptoms of IBS may seem like symptoms of other, more serious digestive disorders. Your health care provider may want to perform tests to exclude these disorders.  TREATMENT Many medicines are available to help correct bowel function or relieve bowel spasms and abdominal pain. Among the medicines available are:  Laxatives for severe constipation and to help restore normal bowel habits.  Specific antidiarrheal medicines to treat severe or lasting diarrhea.  Antispasmodic agents to relieve intestinal cramps. Your health care provider may also decide to treat you with a mild tranquilizer or sedative during unusually stressful periods in your life. Your health care provider may also prescribe antidepressant medicine. The use of this medicine has been shown to reduce pain and other symptoms of IBS. Remember that if any medicine is prescribed for you, you should take it exactly as directed.   Make sure your health care provider knows how well it worked for you. HOME CARE INSTRUCTIONS   Take all medicines as directed by your health care provider.  Avoid foods that are high in fat or oils, such as heavy cream, butter, frankfurters, sausage, and other fatty meats.  Avoid foods that make you go to the bathroom, such as fruit, fruit juice, and dairy products.  Cut out carbonated drinks, chewing gum, and "gassy" foods such as beans and cabbage. This may help relieve bloating and burping.  Eat foods with bran, and drink plenty of liquids with the bran  foods. This helps relieve constipation.  Keep track of what foods seem to bring on your symptoms.  Avoid emotionally charged situations or circumstances that produce anxiety.  Start or continue exercising.  Get plenty of rest and sleep. Document Released: 12/06/2005 Document Revised: 12/11/2013 Document Reviewed: 07/26/2008 ExitCare Patient Information 2015 [employer]. This information is not intended to replace advice given to you by your health care provider. Make sure you discuss any questions you have with your health care provider.  

## 2015-07-22 NOTE — Progress Notes (Signed)
   Subjective:    Patient ID: Amber Colon, female    DOB: 22-Sep-1981, 34 y.o.   MRN: [id number]  Pt presents to the office today to recheck HTN. Pt's BP is at goal today! Hypertension This is a chronic problem. The current episode started more than 1 year ago. The problem is unchanged. The problem is controlled. Pertinent negatives include no anxiety, headaches, palpitations, peripheral edema or shortness of breath. Risk factors for coronary artery disease include obesity, sedentary lifestyle and smoking/tobacco exposure. Past treatments include ACE inhibitors and diuretics. The current treatment provides significant improvement. There is no history of kidney disease, CAD/MI, CVA, heart failure or a thyroid problem. There is no history of sleep apnea.  Diarrhea  This is a chronic problem. The current episode started more than 1 year ago. The problem occurs 2 to 4 times per day. The problem has been unchanged. The stool consistency is described as watery. Associated symptoms include bloating and chills. Pertinent negatives include no abdominal pain or headaches. She has tried bismuth subsalicylate for the symptoms. The treatment provided mild relief.      Review of Systems  Constitutional: Positive for chills.  HENT: Negative.   Eyes: Negative.   Respiratory: Negative.  Negative for shortness of breath.   Cardiovascular: Negative.  Negative for palpitations.  Gastrointestinal: Positive for diarrhea and bloating. Negative for abdominal pain.  Endocrine: Negative.   Genitourinary: Negative.   Musculoskeletal: Negative.   Neurological: Negative.  Negative for headaches.  Hematological: Negative.   Psychiatric/Behavioral: Negative.   All other systems reviewed and are negative.      Objective:   Physical Exam  Constitutional: She is oriented to person, place, and time. She appears well-developed and well-nourished. No distress.  HENT:  Head: Normocephalic and atraumatic.  Right Ear:  External ear normal.  Left Ear: External ear normal.  Nose: Nose normal.  Mouth/Throat: Oropharynx is clear and moist.  Eyes: Pupils are equal, round, and reactive to light.  Neck: Normal range of motion. Neck supple. No thyromegaly present.  Cardiovascular: Normal rate, regular rhythm, normal heart sounds and intact distal pulses.   No murmur heard. Pulmonary/Chest: Effort normal and breath sounds normal. No respiratory distress. She has no wheezes.  Abdominal: Soft. Bowel sounds are normal. She exhibits no distension. There is no tenderness.  Musculoskeletal: Normal range of motion. She exhibits no edema or tenderness.  Neurological: She is alert and oriented to person, place, and time. She has normal reflexes. No cranial nerve deficit.  Skin: Skin is warm and dry.  Psychiatric: She has a normal mood and affect. Her behavior is normal. Judgment and thought content normal.  Vitals reviewed.   BP 123/87 mmHg  Pulse 63  Temp(Src) 97.8 F (36.6 C) (Oral)  Ht  (1.676 m)  Wt 340 lb 3.2 oz (154.314 kg)  BMI 54.94 kg/m2       Assessment & Plan:  1. Essential hypertension --Daily blood pressure log given with instructions on how to fill out and told to bring to next visit -Dash diet information given -Exercise encouraged - Stress Management  -Continue current meds -RTO in 6 months   2. IBS (irritable bowel syndrome) -Increase fiber intake slowly -Diet discussed -RTO prn - dicyclomine (BENTYL) 20 MG tablet; Take 1 tablet (20 mg total) by mouth every 6 (six) hours.  Dispense: 90 tablet; Refill: 1  Jannifer Rodney, FNP

## 2015-08-07 ENCOUNTER — Ambulatory Visit (INDEPENDENT_AMBULATORY_CARE_PROVIDER_SITE_OTHER): Payer: Medicaid Other | Admitting: Otolaryngology

## 2015-08-07 DIAGNOSIS — K219 Gastro-esophageal reflux disease without esophagitis: Secondary | ICD-10-CM

## 2015-08-07 DIAGNOSIS — R1312 Dysphagia, oropharyngeal phase: Secondary | ICD-10-CM

## 2015-08-07 DIAGNOSIS — H66012 Acute suppurative otitis media with spontaneous rupture of ear drum, left ear: Secondary | ICD-10-CM | POA: Diagnosis not present

## 2015-09-04 ENCOUNTER — Ambulatory Visit (INDEPENDENT_AMBULATORY_CARE_PROVIDER_SITE_OTHER): Payer: Medicaid Other | Admitting: Otolaryngology

## 2015-09-25 ENCOUNTER — Other Ambulatory Visit (INDEPENDENT_AMBULATORY_CARE_PROVIDER_SITE_OTHER): Payer: Self-pay | Admitting: Otolaryngology

## 2015-09-25 ENCOUNTER — Ambulatory Visit (INDEPENDENT_AMBULATORY_CARE_PROVIDER_SITE_OTHER): Payer: Medicaid Other | Admitting: Otolaryngology

## 2015-09-25 DIAGNOSIS — H6123 Impacted cerumen, bilateral: Secondary | ICD-10-CM | POA: Diagnosis not present

## 2015-09-25 DIAGNOSIS — H900 Conductive hearing loss, bilateral: Secondary | ICD-10-CM

## 2015-09-25 DIAGNOSIS — H9190 Unspecified hearing loss, unspecified ear: Secondary | ICD-10-CM

## 2015-09-25 DIAGNOSIS — H7102 Cholesteatoma of attic, left ear: Secondary | ICD-10-CM

## 2015-10-02 ENCOUNTER — Ambulatory Visit (HOSPITAL_COMMUNITY)
Admission: RE | Admit: 2015-10-02 | Discharge: 2015-10-02 | Disposition: A | Discharge status: Home / Self Care | Payer: Medicaid Other | Source: Ambulatory Visit | Attending: Otolaryngology | Admitting: Otolaryngology

## 2015-10-02 DIAGNOSIS — H7192 Unspecified cholesteatoma, left ear: Secondary | ICD-10-CM | POA: Insufficient documentation

## 2015-10-02 DIAGNOSIS — H9192 Unspecified hearing loss, left ear: Secondary | ICD-10-CM | POA: Diagnosis present

## 2015-10-02 DIAGNOSIS — H9190 Unspecified hearing loss, unspecified ear: Secondary | ICD-10-CM

## 2015-10-09 ENCOUNTER — Ambulatory Visit (INDEPENDENT_AMBULATORY_CARE_PROVIDER_SITE_OTHER): Payer: Medicaid Other | Admitting: Otolaryngology

## 2015-10-09 DIAGNOSIS — H66012 Acute suppurative otitis media with spontaneous rupture of ear drum, left ear: Secondary | ICD-10-CM | POA: Diagnosis not present

## 2015-10-09 DIAGNOSIS — H7122 Cholesteatoma of mastoid, left ear: Secondary | ICD-10-CM

## 2015-10-14 ENCOUNTER — Other Ambulatory Visit: Payer: Self-pay | Admitting: Otolaryngology

## 2015-11-06 NOTE — Pre-Procedure Instructions (Signed)
Spoke with Kimmy at Dr. Avel Sensoreoh's office; BMI > 50, unable to be done at [facility]

## 2015-11-19 ENCOUNTER — Ambulatory Visit (INDEPENDENT_AMBULATORY_CARE_PROVIDER_SITE_OTHER): Payer: Medicaid Other

## 2015-11-19 DIAGNOSIS — Z23 Encounter for immunization: Secondary | ICD-10-CM

## 2015-12-17 ENCOUNTER — Telehealth: Payer: Self-pay

## 2015-12-17 ENCOUNTER — Encounter (HOSPITAL_COMMUNITY)
Admission: RE | Admit: 2015-12-17 | Discharge: 2015-12-17 | Disposition: A | Discharge status: Home / Self Care | Payer: Medicaid Other | Source: Ambulatory Visit | Attending: Otolaryngology | Admitting: Otolaryngology

## 2015-12-17 ENCOUNTER — Encounter (HOSPITAL_COMMUNITY): Payer: Self-pay

## 2015-12-17 DIAGNOSIS — R9431 Abnormal electrocardiogram [ECG] [EKG]: Secondary | ICD-10-CM | POA: Insufficient documentation

## 2015-12-17 DIAGNOSIS — Z01812 Encounter for preprocedural laboratory examination: Secondary | ICD-10-CM | POA: Insufficient documentation

## 2015-12-17 DIAGNOSIS — E039 Hypothyroidism, unspecified: Secondary | ICD-10-CM | POA: Insufficient documentation

## 2015-12-17 DIAGNOSIS — H7193 Unspecified cholesteatoma, bilateral: Secondary | ICD-10-CM | POA: Diagnosis not present

## 2015-12-17 DIAGNOSIS — K219 Gastro-esophageal reflux disease without esophagitis: Secondary | ICD-10-CM | POA: Diagnosis not present

## 2015-12-17 DIAGNOSIS — Z01818 Encounter for other preprocedural examination: Secondary | ICD-10-CM | POA: Insufficient documentation

## 2015-12-17 DIAGNOSIS — I10 Essential (primary) hypertension: Secondary | ICD-10-CM | POA: Insufficient documentation

## 2015-12-17 DIAGNOSIS — Z79899 Other long term (current) drug therapy: Secondary | ICD-10-CM | POA: Insufficient documentation

## 2015-12-17 HISTORY — DX: Anemia, unspecified: D64.9

## 2015-12-17 HISTORY — DX: Hypothyroidism, unspecified: E03.9

## 2015-12-17 HISTORY — DX: Headache, unspecified: R51.9

## 2015-12-17 HISTORY — DX: Headache: R51

## 2015-12-17 HISTORY — DX: Gastro-esophageal reflux disease without esophagitis: K21.9

## 2015-12-17 LAB — CBC
HCT: 38.5 % (ref 36.0–46.0)
Hemoglobin: 12.4 g/dL (ref 12.0–15.0)
MCH: 26.7 pg (ref 26.0–34.0)
MCHC: 32.2 g/dL (ref 30.0–36.0)
MCV: 83 fL (ref 78.0–100.0)
PLATELETS: 235 10*3/uL (ref 150–400)
RBC: 4.64 MIL/uL (ref 3.87–5.11)
RDW: 15.1 % (ref 11.5–15.5)
WBC: 9 10*3/uL (ref 4.0–10.5)

## 2015-12-17 LAB — HCG, SERUM, QUALITATIVE: PREG SERUM: NEGATIVE

## 2015-12-17 NOTE — Progress Notes (Signed)
   12/17/15 1025  OBSTRUCTIVE SLEEP APNEA  Have you ever been diagnosed with sleep apnea through a sleep study? No  Do you snore loudly (loud enough to be heard through closed doors)?  0  Do you often feel tired, fatigued, or sleepy during the daytime (such as falling asleep during driving or talking to someone)? 1  Has anyone observed you stop breathing during your sleep? 0  Do you have, or are you being treated for high blood pressure? 1 (suppose to, but she doesn't take the meds)  BMI more than 35 kg/m2? 1  Age > 50 (1-yes) 0  Neck circumference greater than:Female 16 inches or larger, Female 17inches or larger? 1  Female Gender (Yes=1) 0  Obstructive Sleep Apnea Score 4  Score 5 or greater  Results sent to PCP

## 2015-12-17 NOTE — Pre-Procedure Instructions (Signed)
Amber Colon  12/17/2015     Your procedure is scheduled on : Wednesday December 23, 2014 at 10:00 AM.  Report [facility] at 8:00 AM.  Call this number if you have problems the morning of surgery: [phone]    Remember:  Do not eat food or drink liquids after midnight.  Take these medicines the morning of surgery with A SIP OF WATER : Acetaminophen (Tylenol), Omeprazole (Prilosec), Dicyclomine (Bentyl)   Stop taking any vitamins, herbal medications, Ibuprofen, Advil, Motrin, Aleve, etc as of today   Do not wear jewelry, make-up or nail polish.  Do not wear lotions, powders, or perfumes.    Do not shave 48 hours prior to surgery.    Do not bring valuables to the hospital.  [facility] is not responsible for any belongings or valuables.  Contacts, dentures or bridgework may not be worn into surgery.  Leave your suitcase in the car.  After surgery it may be brought to your room.  For patients admitted to the hospital, discharge time will be determined by your treatment team.  Patients discharged the day of surgery will not be allowed to drive home.   Name and phone number of your driver:   BETTY  SMITH   MOTHER  Special instructions:  Shower using CHG soap the night before and the morning of your surgery  Please read over the following fact sheets that you were given. Pain Booklet, Coughing and Deep Breathing and Surgical Site Infection Prevention

## 2015-12-17 NOTE — Telephone Encounter (Signed)
[facility] today for [facility] clearance and nurse called to let us know that patient is non compliant on BP medications. Patient states that she took the meds for a short time and felt better so she stopped them. Nurse just wanted us to know.

## 2015-12-17 NOTE — Progress Notes (Addendum)
PCP is Dayton Bailiffhristy Hawkes @ [facility].  She has been told her has HTN and Thyroid issues, was given medications for both, but is non compliant. Denies any cardiac issues.  No cardiac tests done either EKG "abnormal" - called office to get one to compare...they do not have one.
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
## 2015-12-24 ENCOUNTER — Ambulatory Visit (HOSPITAL_COMMUNITY): Payer: Medicaid Other | Admitting: Anesthesiology

## 2015-12-24 ENCOUNTER — Encounter (HOSPITAL_COMMUNITY)
Admission: RE | Disposition: A | Discharge status: Home / Self Care | Payer: Self-pay | Source: Ambulatory Visit | Attending: Otolaryngology

## 2015-12-24 ENCOUNTER — Encounter (HOSPITAL_COMMUNITY): Payer: Self-pay | Admitting: *Deleted

## 2015-12-24 ENCOUNTER — Ambulatory Visit (HOSPITAL_COMMUNITY)
Admission: RE | Admit: 2015-12-24 | Discharge: 2015-12-24 | Disposition: A | Discharge status: Home / Self Care | Payer: Medicaid Other | Source: Ambulatory Visit | Attending: Otolaryngology | Admitting: Otolaryngology

## 2015-12-24 ENCOUNTER — Ambulatory Visit (HOSPITAL_COMMUNITY): Payer: Medicaid Other | Admitting: Vascular Surgery

## 2015-12-24 DIAGNOSIS — H7192 Unspecified cholesteatoma, left ear: Secondary | ICD-10-CM | POA: Diagnosis not present

## 2015-12-24 DIAGNOSIS — H9012 Conductive hearing loss, unilateral, left ear, with unrestricted hearing on the contralateral side: Secondary | ICD-10-CM | POA: Diagnosis not present

## 2015-12-24 DIAGNOSIS — F17200 Nicotine dependence, unspecified, uncomplicated: Secondary | ICD-10-CM | POA: Diagnosis not present

## 2015-12-24 DIAGNOSIS — H7122 Cholesteatoma of mastoid, left ear: Secondary | ICD-10-CM | POA: Diagnosis not present

## 2015-12-24 DIAGNOSIS — I10 Essential (primary) hypertension: Secondary | ICD-10-CM | POA: Diagnosis not present

## 2015-12-24 DIAGNOSIS — H900 Conductive hearing loss, bilateral: Secondary | ICD-10-CM | POA: Insufficient documentation

## 2015-12-24 HISTORY — PX: TYMPANOMASTOIDECTOMY: SHX34

## 2015-12-24 SURGERY — TYMPANOPLASTY, WITH MASTOIDECTOMY
Anesthesia: General | Site: Ear | Laterality: Left

## 2015-12-24 MED ORDER — CLINDAMYCIN HCL 300 MG PO CAPS: 300.0000 mg | ORAL_CAPSULE | Freq: Three times a day (TID) | ORAL | Status: DC

## 2015-12-24 MED ORDER — EPINEPHRINE HCL (NASAL) 0.1 % NA SOLN
NASAL | Status: AC
  Filled 2015-12-24: qty 30

## 2015-12-24 MED ORDER — SUCCINYLCHOLINE CHLORIDE 20 MG/ML IJ SOLN
INTRAMUSCULAR | Status: AC
  Filled 2015-12-24: qty 1

## 2015-12-24 MED ORDER — CIPROFLOXACIN-DEXAMETHASONE 0.3-0.1 % OT SUSP
OTIC | Status: AC
  Filled 2015-12-24: qty 7.5

## 2015-12-24 MED ORDER — PROPOFOL 10 MG/ML IV BOLUS
INTRAVENOUS | Status: AC
  Filled 2015-12-24: qty 20

## 2015-12-24 MED ORDER — ONDANSETRON HCL 4 MG/2ML IJ SOLN
4.0000 mg | Freq: Once | INTRAMUSCULAR | Status: AC | PRN
  Administered 2015-12-24: 4 mg via INTRAVENOUS

## 2015-12-24 MED ORDER — HEMOSTATIC AGENTS (NO CHARGE) OPTIME
TOPICAL | Status: DC | PRN
  Administered 2015-12-24: 1

## 2015-12-24 MED ORDER — LIDOCAINE HCL (CARDIAC) 20 MG/ML IV SOLN
INTRAVENOUS | Status: DC | PRN
  Administered 2015-12-24: 40 mg via INTRAVENOUS

## 2015-12-24 MED ORDER — FENTANYL CITRATE (PF) 100 MCG/2ML IJ SOLN
INTRAMUSCULAR | Status: DC | PRN
Start: 2015-12-24 — End: 2015-12-24
  Administered 2015-12-24 (×3): 50 ug via INTRAVENOUS
  Administered 2015-12-24: 100 ug via INTRAVENOUS
  Administered 2015-12-24 (×5): 50 ug via INTRAVENOUS

## 2015-12-24 MED ORDER — FENTANYL CITRATE (PF) 100 MCG/2ML IJ SOLN
25.0000 ug | INTRAMUSCULAR | Status: DC | PRN
  Administered 2015-12-24 (×2): 50 ug via INTRAVENOUS

## 2015-12-24 MED ORDER — ONDANSETRON HCL 4 MG/2ML IJ SOLN
INTRAMUSCULAR | Status: AC
  Filled 2015-12-24: qty 2

## 2015-12-24 MED ORDER — OXYCODONE HCL 5 MG/5ML PO SOLN: 5.0000 mg | Freq: Once | ORAL | Status: AC | PRN

## 2015-12-24 MED ORDER — LIDOCAINE HCL (CARDIAC) 20 MG/ML IV SOLN
INTRAVENOUS | Status: AC
  Filled 2015-12-24: qty 5

## 2015-12-24 MED ORDER — OXYCODONE HCL 5 MG PO TABS
ORAL_TABLET | ORAL | Status: AC
  Filled 2015-12-24: qty 1

## 2015-12-24 MED ORDER — SODIUM CHLORIDE 0.9 % IV SOLN
10.0000 mg | INTRAVENOUS | Status: DC | PRN
  Administered 2015-12-24: 10 ug/min via INTRAVENOUS

## 2015-12-24 MED ORDER — OXYCODONE-ACETAMINOPHEN 5-325 MG PO TABS: 1.0000 | ORAL_TABLET | ORAL | Status: DC | PRN

## 2015-12-24 MED ORDER — 0.9 % SODIUM CHLORIDE (POUR BTL) OPTIME
TOPICAL | Status: DC | PRN
  Administered 2015-12-24: 1000 mL

## 2015-12-24 MED ORDER — ROCURONIUM BROMIDE 50 MG/5ML IV SOLN
INTRAVENOUS | Status: AC
  Filled 2015-12-24: qty 1

## 2015-12-24 MED ORDER — CLINDAMYCIN PHOSPHATE 600 MG/50ML IV SOLN
600.0000 mg | INTRAVENOUS | Status: AC
  Administered 2015-12-24: 600 mg via INTRAVENOUS
  Filled 2015-12-24: qty 50

## 2015-12-24 MED ORDER — LIDOCAINE-EPINEPHRINE 1 %-1:100000 IJ SOLN
INTRAMUSCULAR | Status: AC
  Filled 2015-12-24: qty 1

## 2015-12-24 MED ORDER — OXYCODONE HCL 5 MG PO TABS
5.0000 mg | ORAL_TABLET | Freq: Once | ORAL | Status: AC | PRN
  Administered 2015-12-24: 5 mg via ORAL

## 2015-12-24 MED ORDER — CIPROFLOXACIN-DEXAMETHASONE 0.3-0.1 % OT SUSP
OTIC | Status: DC | PRN
  Administered 2015-12-24: 40 [drp] via OTIC

## 2015-12-24 MED ORDER — MIDAZOLAM HCL 2 MG/2ML IJ SOLN
INTRAMUSCULAR | Status: AC
  Filled 2015-12-24: qty 2

## 2015-12-24 MED ORDER — SUCCINYLCHOLINE CHLORIDE 20 MG/ML IJ SOLN
INTRAMUSCULAR | Status: DC | PRN
  Administered 2015-12-24: 100 mg via INTRAVENOUS

## 2015-12-24 MED ORDER — LACTATED RINGERS IV SOLN
INTRAVENOUS | Status: DC
  Administered 2015-12-24 (×2): via INTRAVENOUS

## 2015-12-24 MED ORDER — ONDANSETRON HCL 4 MG/2ML IJ SOLN
INTRAMUSCULAR | Status: DC | PRN
  Administered 2015-12-24: 4 mg via INTRAVENOUS

## 2015-12-24 MED ORDER — MIDAZOLAM HCL 5 MG/5ML IJ SOLN
INTRAMUSCULAR | Status: DC | PRN
  Administered 2015-12-24: 2 mg via INTRAVENOUS

## 2015-12-24 MED ORDER — FENTANYL CITRATE (PF) 100 MCG/2ML IJ SOLN
INTRAMUSCULAR | Status: AC
  Administered 2015-12-24: 50 ug via INTRAVENOUS
  Filled 2015-12-24: qty 2

## 2015-12-24 MED ORDER — FENTANYL CITRATE (PF) 250 MCG/5ML IJ SOLN
INTRAMUSCULAR | Status: AC
  Filled 2015-12-24: qty 5

## 2015-12-24 MED ORDER — SODIUM CHLORIDE 0.9 % IR SOLN
Status: DC | PRN
  Administered 2015-12-24: 1000 mL

## 2015-12-24 MED ORDER — LIDOCAINE-EPINEPHRINE 1 %-1:100000 IJ SOLN
INTRAMUSCULAR | Status: DC | PRN
  Administered 2015-12-24: 4 mL

## 2015-12-24 MED ORDER — PROPOFOL 10 MG/ML IV BOLUS
INTRAVENOUS | Status: DC | PRN
  Administered 2015-12-24: 20 mg via INTRAVENOUS
  Administered 2015-12-24: 30 mg via INTRAVENOUS
  Administered 2015-12-24: 20 mg via INTRAVENOUS
  Administered 2015-12-24: 10 mg via INTRAVENOUS
  Administered 2015-12-24: 20 mg via INTRAVENOUS
  Administered 2015-12-24: 10 mg via INTRAVENOUS
  Administered 2015-12-24: 200 mg via INTRAVENOUS
  Administered 2015-12-24 (×2): 20 mg via INTRAVENOUS

## 2015-12-24 SURGICAL SUPPLY — 66 items
ADH SKN CLS APL DERMABOND .7 (GAUZE/BANDAGES/DRESSINGS) ×1
APPLICATOR COTTON TIP 6IN STRL (MISCELLANEOUS) ×2 IMPLANT
BALL CTTN LRG ABS STRL LF (GAUZE/BANDAGES/DRESSINGS) ×1
BLADE 10 SAFETY STRL DISP (BLADE) ×3 IMPLANT
BLADE SURG 15 STRL LF DISP TIS (BLADE) IMPLANT
BLADE SURG 15 STRL SS (BLADE) ×3
BLADE SURG ROTATE 9660 (MISCELLANEOUS) ×3 IMPLANT
BUR DIAMOND COARSE 3.0 (BURR) ×2 IMPLANT
BUR RND OSTEON ELITE 6.0 (BURR) ×1 IMPLANT
BUR RND OSTEON ELITE 6.0MM (BURR) ×1
CANISTER SUCTION 2500CC (MISCELLANEOUS) ×3 IMPLANT
CLEANER TIP ELECTROSURG 2X2 (MISCELLANEOUS) ×2 IMPLANT
CONT SPEC 4OZ CLIKSEAL STRL BL (MISCELLANEOUS) ×2 IMPLANT
CORDS BIPOLAR (ELECTRODE) ×2 IMPLANT
COTTONBALL LRG STERILE PKG (GAUZE/BANDAGES/DRESSINGS) ×3 IMPLANT
COVER SURGICAL LIGHT HANDLE (MISCELLANEOUS) ×5 IMPLANT
DERMABOND ADVANCED (GAUZE/BANDAGES/DRESSINGS) ×2
DERMABOND ADVANCED .7 DNX12 (GAUZE/BANDAGES/DRESSINGS) IMPLANT
DRAPE MICROSCOPE LEICA 54X105 (DRAPE) ×3 IMPLANT
DRAPE NEUROLOGICAL W/INCISE (DRAPES) ×3 IMPLANT
DRAPE PROXIMA HALF (DRAPES) ×2 IMPLANT
DRAPE SURG 17X23 STRL (DRAPES) ×5 IMPLANT
DRESSING NASAL KENNEDY 3.5X.9 (MISCELLANEOUS) IMPLANT
DRSG GLASSCOCK MASTOID ADT (GAUZE/BANDAGES/DRESSINGS) ×2 IMPLANT
DRSG GLASSCOCK MASTOID PED (GAUZE/BANDAGES/DRESSINGS) IMPLANT
DRSG NASAL KENNEDY 3.5X.9 (MISCELLANEOUS) ×3
ELECT COATED BLADE 2.86 ST (ELECTRODE) ×3 IMPLANT
ELECT PAIRED SUBDERMAL (MISCELLANEOUS) ×3
ELECT REM PT RETURN 9FT ADLT (ELECTROSURGICAL) ×3
ELECTRODE PAIRED SUBDERMAL (MISCELLANEOUS) IMPLANT
ELECTRODE REM PT RTRN 9FT ADLT (ELECTROSURGICAL) ×1 IMPLANT
GAUZE SPONGE 4X4 12PLY STRL (GAUZE/BANDAGES/DRESSINGS) ×3 IMPLANT
GAUZE SPONGE 4X4 16PLY XRAY LF (GAUZE/BANDAGES/DRESSINGS) ×2 IMPLANT
GLOVE BIOGEL PI IND STRL 7.5 (GLOVE) IMPLANT
GLOVE BIOGEL PI INDICATOR 7.5 (GLOVE) ×2
GLOVE ECLIPSE 7.5 STRL STRAW (GLOVE) ×3 IMPLANT
GOWN STRL REUS W/ TWL LRG LVL3 (GOWN DISPOSABLE) ×2 IMPLANT
GOWN STRL REUS W/TWL LRG LVL3 (GOWN DISPOSABLE) ×6
HEMOSTAT SURGICEL 2X14 (HEMOSTASIS) ×3 IMPLANT
KIT BASIN OR (CUSTOM PROCEDURE TRAY) ×3 IMPLANT
KIT ROOM TURNOVER OR (KITS) ×3 IMPLANT
MEROCEL AMBRUS EAR PACK ×2 IMPLANT
NDL 18GX1X1/2 (RX/OR ONLY) (NEEDLE) IMPLANT
NDL HYPO 25GX1X1/2 BEV (NEEDLE) IMPLANT
NEEDLE 18GX1X1/2 (RX/OR ONLY) (NEEDLE) ×3 IMPLANT
NEEDLE HYPO 25GX1X1/2 BEV (NEEDLE) ×3 IMPLANT
NS IRRIG 1000ML POUR BTL (IV SOLUTION) ×3 IMPLANT
PAD ARMBOARD 7.5X6 YLW CONV (MISCELLANEOUS) ×6 IMPLANT
PATTIES SURGICAL .5 X.5 (GAUZE/BANDAGES/DRESSINGS) IMPLANT
PENCIL BUTTON HOLSTER BLD 10FT (ELECTRODE) ×3 IMPLANT
PUNCH BIOPSY (OTIC EAR SUPPLIES) IMPLANT
SPECIMEN JAR SMALL (MISCELLANEOUS) IMPLANT
SPONGE SURGIFOAM ABS GEL 12-7 (HEMOSTASIS) ×3 IMPLANT
SUT BONE WAX W31G (SUTURE) ×2 IMPLANT
SUT PLAIN 5 0 P 3 18 (SUTURE) IMPLANT
SUT VICRYL RAPIDE 4/0 PS 2 (SUTURE) ×5 IMPLANT
SYR 3ML LL SCALE MARK (SYRINGE) ×3 IMPLANT
SYR TB 1ML LUER SLIP (SYRINGE) IMPLANT
TOWEL OR 17X24 6PK STRL BLUE (TOWEL DISPOSABLE) ×3 IMPLANT
TRAY ENT MC OR (CUSTOM PROCEDURE TRAY) ×3 IMPLANT
TRAY FOLEY CATH 14FRSI W/METER (CATHETERS) IMPLANT
TUBE ENDOTRAC EMG 7X10.2 (MISCELLANEOUS) IMPLANT
TUBING EXTENTION W/L.L. (IV SETS) ×3 IMPLANT
TUBING IRRIGATION (MISCELLANEOUS) ×3 IMPLANT
VENT IRR SPI W TUB AD (MISCELLANEOUS) IMPLANT
WIPE INSTRUMENT VISIWIPE 73X73 (MISCELLANEOUS) ×3 IMPLANT

## 2015-12-24 NOTE — Progress Notes (Signed)
Report given to maria rn as caregiver 

## 2015-12-24 NOTE — Transfer of Care (Signed)
Immediate Anesthesia Transfer of Care Note  Patient: Amber Colon  Procedure(s) Performed: Procedure(s): LEFT MODIFIED  RADICAL TYMPANOMASTOIDECTOMY (Left)  Patient Location: PACU  Anesthesia Type:General  Level of Consciousness: awake, alert  and oriented  Airway & Oxygen Therapy: Patient Spontanous Breathing and Patient connected to nasal cannula oxygen  Post-op Assessment: Report given to RN, Post -op Vital signs reviewed and stable and Patient moving all extremities X 4  Post vital signs: Reviewed and stable  Last Vitals:  Filed Vitals:   12/24/15 0856  BP: 148/82  Pulse: 83  Temp: 37 C  Resp: 20    Complications: No apparent anesthesia complications

## 2015-12-24 NOTE — H&P (Signed)
Cc: Left ear cholesteatoma  HPI: The patient is a 35 year old female who presents today with her mother.  The patient was last seen 2 weeks ago.  At that time, she was noted to have left ear cholesteatoma.  An attic perforation was noted on the left side.  In addition, she was also noted to have polypoid tissue covering the right tympanic membrane.  The patient was treated with Ciprodex eardrops.  She subsequently underwent a temporal bone CT scan.  The CT showed extensive left ear cholesteatoma with destruction of the left ear middle ear ossicles and the scutum.  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   According to the patient, her right ear drainage has resolved.  Currently she denies any otorrhea or otalgia.  She continues to have bilateral hearing difficulty, worse on the left side.  It should be noted that the temporal bone CT also showed possible soft tissue within the right epitympanum suggestive of right ear cholesteatoma. No other ENT, GI, or respiratory issue noted since the last visit.   Exam General: Communicates without difficulty, well nourished, no acute distress. Head: Normocephalic, no evidence injury, no tenderness, facial buttresses intact without stepoff. Eyes: PERRL, EOMI. No scleral icterus, conjunctivae clear. Neuro: CN II exam reveals vision grossly intact. No nystagmus at any point of gaze. Ears: Auricles well formed without lesions. Squamous debris is noted on the left with attic cholesteatoma . Moisture is also noted on the right. Under the operating microscope, both ears are debrided with a combination of suction catheters. The right TM is intact. Nose: External evaluation reveals normal support and skin without lesions. Dorsum is intact. Anterior rhinoscopy reveals healthy pink mucosa over anterior aspect of inferior turbinates and intact septum. No purulence noted. Oral cavity: Lips without lesions, oral mucosa moist, no masses or lesions seen. Pharynx:  Clear, no erythema. Neck: Supple, full range of motion, no lymphadenopathy, no masses palpable. Salivary: Parotid and submandibular glands without mass. Neuro:  CN 2-12 grossly intact. Gait normal. Vestibular: No nystagmus at any point of gaze.   Assessment 1.  Bilateral cholesteatoma, significantly worse on the left side.  The left middle ear ossicles and left scutum were eroded.  The CT also suggests right epitympanic cholesteatoma.  2. 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  3. The previously noted acute otitis media has resolved.   Plan  1.  Otomicroscopy with debridement of the left ear cholesteatoma.  2.  The physical exam findings and the CT images are reviewed with the patient and her mother.  3.  We will proceed with left ear modified radical tympanomastoidectomy surgery to remove the extensive cholesteatoma.  The risks, benefits, alternatives and details of the procedure are reviewed with the patient and her mother.   4.  The patient would like to proceed with the procedure.

## 2015-12-24 NOTE — Anesthesia Preprocedure Evaluation (Addendum)
Anesthesia Evaluation  Patient identified by MRN, date of birth, ID band Patient awake    Reviewed: Allergy & Precautions, NPO status , Patient's Chart, lab work & pertinent test results  Airway Mallampati: II  TM Distance: >3 FB Neck ROM: Full    Dental  (+) Teeth Intact, Dental Advisory Given   Pulmonary Current Smoker,    breath sounds clear to auscultation       Cardiovascular hypertension,  Rhythm:Regular Rate:Normal     Neuro/Psych    GI/Hepatic   Endo/Other    Renal/GU      Musculoskeletal   Abdominal (+) + obese,   Peds  Hematology   Anesthesia Other Findings   Reproductive/Obstetrics                            Anesthesia Physical Anesthesia Plan  ASA: III  Anesthesia Plan: General   Post-op Pain Management:    Induction: Intravenous  Airway Management Planned: Oral ETT  Additional Equipment:   Intra-op Plan:   Post-operative Plan: Extubation in OR  Informed Consent: I have reviewed the patients History and Physical, chart, labs and discussed the procedure including the risks, benefits and alternatives for the proposed anesthesia with the patient or authorized representative who has indicated his/her understanding and acceptance.   Dental advisory given  Plan Discussed with: CRNA and Anesthesiologist  Anesthesia Plan Comments:         Anesthesia Quick Evaluation

## 2015-12-24 NOTE — Discharge Instructions (Signed)
POSTOPERATIVE INSTRUCTIONS FOR PATIENTS HAVING MASTOIDECTOMY SURGERY ° °1. You may have nausea, vomiting, or a low grade fever for a few days after surgery. This is not unusual.  However, if the nausea and vomiting become severe or last more than one day, please call our office. Medication for nausea may be prescribed. You may take Tylenol every four hours for fever. If your fever should rise above 101 F, please contact our office. °2. Limit your activities for one week. This includes avoiding heavy lifting (over 20lbs), vigorous exercise, and contact sports. °3. Do not blow your nose for approximately one week.  Any accumulation in the nose should be drawn back into the throat and expectorated through the mouth to avoid infecting the ear. If it is necessary to sneeze, do so with your mouth open to decrease pressure to your ears. Do not hold your nose to avoid sneezing.  °4. You may wash your hair 2 days after the operation. Please protect the ear and any external incision from water. We recommend placing some plastic wrap over the ear and incision to help protect against water. It may be necessary to have someone help you during the first several washings.  °5. Try to keep the incision clean and dry. You should clean crust from the incisional area with diluted hydrogen peroxide. Any time you are going to clean your ear, please wash your hands thoroughly prior to starting. °6. Some dull postoperative ear pain is expected. Your physician may prescribe pain medicine to help relieve your discomfort. If your postoperative pain increases and your medication is not helping, please call the office before taking any other medication that we have not prescribed or recommended. °7. If any of the following should occur, contact Dr.Lachanda Colon:   (Office: [phone])) °a. Persistent bleeding                                                             °b. Persistent fever °c. Purulent drainage (pus) from the ear or  incision °d. Increasing redness around the suture line °e. Persistent pain or dizziness °f. Facial weakness °8. Sometimes, with a larger incision behind the ear, the incision may open and drain. If it occurs, please contact our office. °9. If your physician prescribes an antibiotic, fill the prescription promptly and take all of the medicine as directed until the entire supply is gone. °10.  You may experience some popping and cracking sounds in the ear for up to several weeks. It may sound like you are “talking in a barrel” or a tunnel. This is normal and should not cause concern. °11. Because a nerve for taste passes through your ear, it is not unusual for your taste sensation to be altered for several weeks or months. °12. You may experience some numbness in your outer ear, earlobe, and the incision area. This is normal, and most of the numbness will be expected to fade over a period of time.                                                               °  13. Your eardrum may look “pink” or “red” for up to a month postoperatively. The red coloration is due to fluid in the middle ear. The change in color should not be confused with infection.  °14. It is important to return to our office for your postoperative appointment as scheduled. If for some reason you were not given a postoperative appointment, please call our office at [phone]. °

## 2015-12-24 NOTE — Op Note (Signed)
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 Erosion of left malleus and incus  PROCEDURE PERFORMED:  Left canal wall down modified radical tympanomastoidectomy   ANESTHESIA:  General endotracheal tube anesthesia.  COMPLICATIONS:  None.  ESTIMATED BLOOD LOSS:  100ml  INDICATION FOR PROCEDURE:  Amber Colon is a 35 y.o. female with a history of chronic left ear drainage.  On examination, she was noted to have an attic perforation with middle ear cholesteatoma. She was noted to have significant left ear conductive hearing loss. On her CT scan, extensive left ear cholesteatoma was noted, with erosion of the left middle ear ossicles and scutum. 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 Based on the above findings, the decision was made for patient to undergo the above-stated procedure. The risks, benefits, alternatives, and details of the procedure were discussed with the mother.  Questions were invited and answered.  Informed consent was obtained.  DESCRIPTION:  The patient was taken to the operating room and placed supine on the operating table.  General endotracheal tube anesthesia was administered by the anesthesiologist.  The patient was positioned and prepped and draped in a standard fashion for left ear surgery. Facial nerve monitoring electrodes were placed. The facial nerve monitoring system was functional throughout the case.  Under the operating microscope, the left ear canal was cleaned of all cerumen. An attic perforation was noted. Cholesteatoma was noted within the left epitympanum. A standard tympanomeatal flap was elevated in a standard fashion. Most of her malleus and incus were missing. A portion of the  stapes superstructure was visible. The cholesteatoma was removed. The remaining malleus and incus were also removed.   Attention was then focused on the mastoid. A standard postauricular incision was made. The soft tissue overlying the mastoid cortex was elevated. A standard cortical mastoidectomy was performed. It should be noted that the patient's mastoid bone was severely hypoplastic. No air cell was noted. The entire mastoid bone was a small solid block. The sigmoid sinus was approximately 6 mm posterior to the ear canal. A canal wall down radical mastoidectomy procedure was then performed. All remaining cholesteatoma was removed. A piece of temporalis fascia was also harvested. The fascia graft was used to reconstruct a neotympanum overlying the remaining stapes superstructure.  Attention was then focused on performing the meatoplasty.  12:00 and 6:00 incisions were made. The posterior conchal cartilage was removed. The meatal flap was sutured to the posterior mastoid soft tissue. A large otowick was placed.   The care of the patient was turned over to the anesthesiologist.  The patient was awakened from anesthesia without difficulty.  She was extubated and transferred to the recovery room in good condition.  OPERATIVE FINDINGS: Left ear cholesteatoma, with erosion of the patient's malleus, incus, and scutum. 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 The sigmoid sinus was noted to be approximately 6 mm posterior to the ear canal.   SPECIMEN: Left ear cholesteatoma.   FOLLOWUP CARE:  The patient will be discharged home once awake and alert. The patient will follow up in my office in approximately 1 week.  Jaimes Eckert,SUI  W 12/24/2015 1:06 PM

## 2015-12-24 NOTE — Anesthesia Procedure Notes (Signed)
Procedure Name: Intubation Date/Time: 12/24/2015 10:27 AM Performed by: Quentin OreWALKER, Lydian Chavous E Pre-anesthesia Checklist: Patient identified, Emergency Drugs available, Suction available, Patient being monitored and Timeout performed Patient Re-evaluated:Patient Re-evaluated prior to inductionOxygen Delivery Method: Circle system utilized Preoxygenation: Pre-oxygenation with 100% oxygen Intubation Type: IV induction and Rapid sequence Laryngoscope Size: Mac and 3 Grade View: Grade I Tube type: Oral Tube size: 7.0 mm Number of attempts: 1 Airway Equipment and Method: Stylet Placement Confirmation: ETT inserted through vocal cords under direct vision,  positive ETCO2 and breath sounds checked- equal and bilateral Secured at: 21 cm Tube secured with: Tape Dental Injury: Teeth and Oropharynx as per pre-operative assessment

## 2015-12-26 ENCOUNTER — Encounter (HOSPITAL_COMMUNITY): Payer: Self-pay | Admitting: Otolaryngology

## 2015-12-30 ENCOUNTER — Encounter (HOSPITAL_COMMUNITY): Payer: Self-pay | Admitting: Otolaryngology

## 2015-12-31 NOTE — Anesthesia Postprocedure Evaluation (Signed)
Anesthesia Post Note  Patient: Amber Colon  Procedure(s) Performed: Procedure(s) (LRB): LEFT MODIFIED  RADICAL TYMPANOMASTOIDECTOMY (Left)  Patient location during evaluation: PACU Anesthesia Type: General Level of consciousness: awake and alert Pain management: pain level controlled Vital Signs Assessment: post-procedure vital signs reviewed and stable Respiratory status: spontaneous breathing, nonlabored ventilation, respiratory function stable and patient connected to nasal cannula oxygen Cardiovascular status: blood pressure returned to baseline and stable Postop Assessment: no signs of nausea or vomiting Anesthetic complications: no    Last Vitals:  Filed Vitals:   12/24/15 1515 12/24/15 1536  BP:    Pulse: 75   Temp: 36 C 36.8 C  Resp: 20 17    Last Pain:  Filed Vitals:   12/24/15 1540  PainSc: 3                  Jajuan Skoog,JAMES TERRILL

## 2016-01-01 ENCOUNTER — Ambulatory Visit (INDEPENDENT_AMBULATORY_CARE_PROVIDER_SITE_OTHER): Payer: Self-pay | Admitting: Otolaryngology

## 2016-01-22 ENCOUNTER — Encounter: Payer: Self-pay | Admitting: Family

## 2016-01-22 ENCOUNTER — Ambulatory Visit (INDEPENDENT_AMBULATORY_CARE_PROVIDER_SITE_OTHER): Payer: Medicaid Other | Admitting: Family

## 2016-01-22 VITALS — BP 136/106 | HR 93 | Temp 97.2°F | Ht 66.0 in | Wt 349.0 lb

## 2016-01-22 DIAGNOSIS — E039 Hypothyroidism, unspecified: Secondary | ICD-10-CM | POA: Diagnosis not present

## 2016-01-22 DIAGNOSIS — Z136 Encounter for screening for cardiovascular disorders: Secondary | ICD-10-CM

## 2016-01-22 DIAGNOSIS — F411 Generalized anxiety disorder: Secondary | ICD-10-CM | POA: Diagnosis not present

## 2016-01-22 DIAGNOSIS — K219 Gastro-esophageal reflux disease without esophagitis: Secondary | ICD-10-CM | POA: Diagnosis not present

## 2016-01-22 DIAGNOSIS — Z13220 Encounter for screening for lipoid disorders: Secondary | ICD-10-CM | POA: Diagnosis not present

## 2016-01-22 DIAGNOSIS — E8881 Metabolic syndrome: Secondary | ICD-10-CM | POA: Insufficient documentation

## 2016-01-22 DIAGNOSIS — IMO0001 Reserved for inherently not codable concepts without codable children: Secondary | ICD-10-CM

## 2016-01-22 DIAGNOSIS — Z6841 Body Mass Index (BMI) 40.0 and over, adult: Secondary | ICD-10-CM

## 2016-01-22 DIAGNOSIS — I10 Essential (primary) hypertension: Secondary | ICD-10-CM | POA: Diagnosis not present

## 2016-01-22 DIAGNOSIS — D509 Iron deficiency anemia, unspecified: Secondary | ICD-10-CM | POA: Diagnosis not present

## 2016-01-22 MED ORDER — ESCITALOPRAM OXALATE 10 MG PO TABS: 10.0000 mg | ORAL_TABLET | Freq: Every day | ORAL | Status: DC

## 2016-01-22 MED ORDER — HYDROCHLOROTHIAZIDE 25 MG PO TABS: 25.0000 mg | ORAL_TABLET | Freq: Every day | ORAL | Status: DC

## 2016-01-22 NOTE — Progress Notes (Signed)
Subjective:    Patient ID: Amber Colon, female    DOB: Nov 21, 1981, 35 y.o.   MRN: [id number]  Pt presents to the office today for chronic follow up.  Hypertension This is a chronic problem. The current episode started more than 1 year ago. The problem is unchanged. The problem is uncontrolled. Associated symptoms include anxiety and blurred vision. Pertinent negatives include no palpitations or peripheral edema. Risk factors for coronary artery disease include obesity, family history, sedentary lifestyle, smoking/tobacco exposure and stress. Past treatments include nothing. The current treatment provides no improvement. Hypertensive end-organ damage includes a thyroid problem. There is no history of kidney disease, CAD/MI, CVA or heart failure.  Nicotine Dependence Presents for initial visit. Symptoms include irritability. Preferred tobacco types include cigarettes. Her urge triggers include company of smokers.  Anxiety Presents for initial visit. Onset was more than 5 years ago. The problem has been waxing and waning. Symptoms include excessive worry, irritability, nervous/anxious behavior, panic and restlessness. Patient reports no depressed mood, nausea or palpitations. Symptoms occur most days. The severity of symptoms is moderate. The quality of sleep is fair.   Her past medical history is significant for anemia and anxiety/panic attacks. Past treatments include nothing.  Thyroid Problem Presents for follow-up visit. Symptoms include anxiety. Patient reports no constipation, depressed mood, dry skin or palpitations. The symptoms have been worsening. Past treatments include levothyroxine. The treatment provided mild relief. There is no history of heart failure.  Anemia Presents for follow-up visit. Symptoms include light-headedness. There has been no palpitations. Past treatments include oral iron supplements. There is no history of heart failure. Family history includes iron deficiency.    Gastroesophageal Reflux She complains of heartburn. She reports no coughing or no nausea. This is a new problem. The current episode started more than 1 year ago. The problem occurs frequently. The problem has been waxing and waning. The heartburn duration is less than a minute. The heartburn limits her activity. The symptoms are aggravated by certain foods, lying down, smoking and stress. Risk factors include obesity. She has tried a PPI for the symptoms. The treatment provided significant relief.  IBS PT currently taking bentyl 20 mg  BID. Pt states this has greatly improved her diarrhea and abdominal pain.    Review of Systems  Constitutional: Positive for irritability.  Eyes: Positive for blurred vision.  Respiratory: Negative.  Negative for cough.   Cardiovascular: Negative.  Negative for palpitations.  Gastrointestinal: Positive for heartburn. Negative for nausea and constipation.  Endocrine: Negative.   Genitourinary: Negative.   Musculoskeletal: Negative.   Neurological: Positive for light-headedness.  Hematological: Negative.   Psychiatric/Behavioral: The patient is nervous/anxious.   All other systems reviewed and are negative.      Objective:   Physical Exam  Constitutional: She is oriented to person, place, and time. She appears well-developed and well-nourished. No distress.  HENT:  Head: Normocephalic and atraumatic.  Right Ear: There is drainage (yellow brownish) and tenderness. Tympanic membrane is perforated.  Mouth/Throat: Oropharynx is clear and moist.  Eyes: Pupils are equal, round, and reactive to light.  Neck: Normal range of motion. Neck supple. No thyromegaly present.  Cardiovascular: Normal rate, regular rhythm, normal heart sounds and intact distal pulses.   No murmur heard. Pulmonary/Chest: Effort normal and breath sounds normal. No respiratory distress. She has no wheezes.  Abdominal: Soft. Bowel sounds are normal. She exhibits no distension. There is  no tenderness.  Musculoskeletal: Normal range of motion. She exhibits no edema or  tenderness.  Neurological: She is alert and oriented to person, place, and time. She has normal reflexes. No cranial nerve deficit.  Skin: Skin is warm and dry.  Psychiatric: She has a normal mood and affect. Her behavior is normal. Judgment and thought content normal.  Vitals reviewed.   BP 136/106 mmHg  Pulse 93  Temp(Src) 97.2 F (36.2 C) (Oral)  Ht '5\' 6"'  (1.676 m)  Wt 349 lb (158.305 kg)  BMI 56.36 kg/m2       Assessment & Plan:  1. Essential hypertension -Pt started on HCTZ 25 mg today -Daily blood pressure log given with instructions on how to fill out and told to bring to next visit -Dash diet information given -Exercise encouraged - Stress Management  -Continue current meds -RTO in 2 weeks - hydrochlorothiazide (HYDRODIURIL) 25 MG tablet; Take 1 tablet (25 mg total) by mouth daily.  Dispense: 90 tablet; Refill: 3 - CMP14+EGFR  2. Hypothyroidism, unspecified hypothyroidism type - CMP14+EGFR - Thyroid Panel With TSH  3. Anemia, iron deficiency - Anemia Profile B - CMP14+EGFR  4. GAD (generalized anxiety disorder) -Pt started on Lexapro 10 mg today -Stress management discussed -List of psychologists given to patient to contact to talk with - escitalopram (LEXAPRO) 10 MG tablet; Take 1 tablet (10 mg total) by mouth daily.  Dispense: 90 tablet; Refill: 3 - CMP14+EGFR  5. Gastroesophageal reflux disease, esophagitis presence not specified - CMP14+EGFR  6. Morbid obesity with BMI of 50.0-59.9, adult (HCC) - CMP14+EGFR - Lipid panel  7. Metabolic syndrome - XHB71+IRCV - Lipid panel  8. Encounter for cholesteral screening for cardiovascular disease - CMP14+EGFR - Lipid panel   Continue all meds Labs pending Health Maintenance reviewed Diet and exercise encouraged RTO 2 weeks to recheck HTN and GAD  Evelina Dun, FNP

## 2016-01-22 NOTE — Addendum Note (Signed)
Addended by: Prescott Gum on: 01/22/2016 11:56 AM   Modules accepted: Kipp Brood

## 2016-01-22 NOTE — Patient Instructions (Signed)
Generalized Anxiety Disorder Generalized anxiety disorder (GAD) is a mental disorder. It interferes with life functions, including relationships, work, and school. GAD is different from normal anxiety, which everyone experiences at some point in their lives in response to specific life events and activities. Normal anxiety actually helps us prepare for and get through these life events and activities. Normal anxiety goes away after the event or activity is over.  GAD causes anxiety that is not necessarily related to specific events or activities. It also causes excess anxiety in proportion to specific events or activities. The anxiety associated with GAD is also difficult to control. GAD can vary from mild to severe. People with severe GAD can have intense waves of anxiety with physical symptoms (panic attacks).  SYMPTOMS The anxiety and worry associated with GAD are difficult to control. This anxiety and worry are related to many life events and activities and also occur more days than not for 6 months or longer. People with GAD also have three or more of the following symptoms (one or more in children):  Restlessness.   Fatigue.  Difficulty concentrating.   Irritability.  Muscle tension.  Difficulty sleeping or unsatisfying sleep. DIAGNOSIS GAD is diagnosed through an assessment by your health care provider. Your health care provider will ask you questions aboutyour mood,physical symptoms, and events in your life. Your health care provider may ask you about your medical history and use of alcohol or drugs, including prescription medicines. Your health care provider may also do a physical exam and blood tests. Certain medical conditions and the use of certain substances can cause symptoms similar to those associated with GAD. Your health care provider may refer you to a mental health specialist for further evaluation. TREATMENT The following therapies are usually used to treat GAD:    Medication. Antidepressant medication usually is prescribed for long-term daily control. Antianxiety medicines may be added in severe cases, especially when panic attacks occur.   Talk therapy (psychotherapy). Certain types of talk therapy can be helpful in treating GAD by providing support, education, and guidance. A form of talk therapy called cognitive behavioral therapy can teach you healthy ways to think about and react to daily life events and activities.  Stress managementtechniques. These include yoga, meditation, and exercise and can be very helpful when they are practiced regularly. A mental health specialist can help determine which treatment is best for you. Some people see improvement with one therapy. However, other people require a combination of therapies.   This information is not intended to replace advice given to you by your health care provider. Make sure you discuss any questions you have with your health care provider.   Document Released: 04/02/2013 Document Revised: 12/27/2014 Document Reviewed: 04/02/2013 Elsevier Interactive Patient Education 2016 [employer]  

## 2016-01-23 ENCOUNTER — Telehealth: Payer: Self-pay | Admitting: Family

## 2016-01-23 ENCOUNTER — Other Ambulatory Visit: Payer: Self-pay | Admitting: Family

## 2016-01-23 LAB — ANEMIA PROFILE B
BASOS ABS: 0.1 10*3/uL (ref 0.0–0.2)
Basos: 1 %
EOS (ABSOLUTE): 0.5 10*3/uL — AB (ref 0.0–0.4)
Eos: 7 %
FOLATE: 4.9 ng/mL (ref >3.0)
Ferritin: 12 ng/mL — ABNORMAL LOW (ref 15–150)
Hematocrit: 34.5 % (ref 34.0–46.6)
Hemoglobin: 11.5 g/dL (ref 11.1–15.9)
IMMATURE GRANULOCYTES: 0 %
Immature Grans (Abs): 0 10*3/uL (ref 0.0–0.1)
Iron Saturation: 13 % — ABNORMAL LOW (ref 15–55)
Iron: 45 ug/dL (ref 27–159)
LYMPHS: 29 %
Lymphocytes Absolute: 2.2 10*3/uL (ref 0.7–3.1)
MCH: 25.6 pg — ABNORMAL LOW (ref 26.6–33.0)
MCHC: 33.3 g/dL (ref 31.5–35.7)
MCV: 77 fL — AB (ref 79–97)
MONOCYTES: 7 %
Monocytes Absolute: 0.5 10*3/uL (ref 0.1–0.9)
NEUTROS ABS: 4.4 10*3/uL (ref 1.4–7.0)
NEUTROS PCT: 56 %
PLATELETS: 201 10*3/uL (ref 150–379)
RBC: 4.49 x10E6/uL (ref 3.77–5.28)
RDW: 15.1 % (ref 12.3–15.4)
RETIC CT PCT: 1.2 % (ref 0.6–2.6)
TIBC: 342 ug/dL (ref 250–450)
UIBC: 297 ug/dL (ref 131–425)
VITAMIN B 12: 267 pg/mL (ref 211–946)
WBC: 7.8 10*3/uL (ref 3.4–10.8)

## 2016-01-23 LAB — CMP14+EGFR
ALK PHOS: 91 IU/L (ref 39–117)
ALT: 22 IU/L (ref 0–32)
AST: 21 IU/L (ref 0–40)
Albumin/Globulin Ratio: 1.4 (ref 1.1–2.5)
Albumin: 4.3 g/dL (ref 3.5–5.5)
BILIRUBIN TOTAL: 0.5 mg/dL (ref 0.0–1.2)
BUN/Creatinine Ratio: 11 (ref 8–20)
BUN: 11 mg/dL (ref 6–20)
CHLORIDE: 101 mmol/L (ref 96–106)
CO2: 22 mmol/L (ref 18–29)
Calcium: 9.4 mg/dL (ref 8.7–10.2)
Creatinine, Ser: 0.99 mg/dL (ref 0.57–1.00)
GFR calc Af Amer: 86 mL/min/{1.73_m2} (ref >59)
GFR calc non Af Amer: 75 mL/min/{1.73_m2} (ref >59)
GLUCOSE: 103 mg/dL — AB (ref 65–99)
Globulin, Total: 3.1 g/dL (ref 1.5–4.5)
Potassium: 3.9 mmol/L (ref 3.5–5.2)
Sodium: 138 mmol/L (ref 134–144)
TOTAL PROTEIN: 7.4 g/dL (ref 6.0–8.5)

## 2016-01-23 LAB — THYROID PANEL WITH TSH
Free Thyroxine Index: 2.2 (ref 1.2–4.9)
T3 Uptake Ratio: 27 % (ref 24–39)
T4, Total: 8.3 ug/dL (ref 4.5–12.0)
TSH: 5.29 u[IU]/mL — ABNORMAL HIGH (ref 0.450–4.500)

## 2016-01-23 LAB — LIPID PANEL
CHOLESTEROL TOTAL: 242 mg/dL — AB (ref 100–199)
Chol/HDL Ratio: 4.5 ratio units — ABNORMAL HIGH (ref 0.0–4.4)
HDL: 54 mg/dL (ref >39)
LDL Calculated: 159 mg/dL — ABNORMAL HIGH (ref 0–99)
Triglycerides: 146 mg/dL (ref 0–149)
VLDL CHOLESTEROL CAL: 29 mg/dL (ref 5–40)

## 2016-01-23 MED ORDER — ATORVASTATIN CALCIUM 40 MG PO TABS: 40.0000 mg | ORAL_TABLET | Freq: Every day | ORAL | Status: DC

## 2016-01-23 MED ORDER — LEVOTHYROXINE SODIUM 50 MCG PO TABS: 50.0000 ug | ORAL_TABLET | Freq: Every day | ORAL | Status: DC

## 2016-01-23 NOTE — Telephone Encounter (Signed)
Reviewed results with pt .

## 2016-02-02 ENCOUNTER — Emergency Department (HOSPITAL_COMMUNITY)
Admission: EM | Admit: 2016-02-02 | Discharge: 2016-02-02 | Disposition: A | Discharge status: Home / Self Care | Payer: Medicaid Other | Attending: Emergency Medicine | Admitting: Emergency Medicine

## 2016-02-02 ENCOUNTER — Encounter (HOSPITAL_COMMUNITY): Payer: Self-pay | Admitting: *Deleted

## 2016-02-02 ENCOUNTER — Telehealth: Payer: Self-pay | Admitting: Family

## 2016-02-02 DIAGNOSIS — N939 Abnormal uterine and vaginal bleeding, unspecified: Secondary | ICD-10-CM | POA: Diagnosis not present

## 2016-02-02 DIAGNOSIS — F17200 Nicotine dependence, unspecified, uncomplicated: Secondary | ICD-10-CM | POA: Insufficient documentation

## 2016-02-02 DIAGNOSIS — I10 Essential (primary) hypertension: Secondary | ICD-10-CM | POA: Diagnosis not present

## 2016-02-02 NOTE — ED Notes (Signed)
Pt called EMS because she believes she was going to has an episode of syncope. Pt is currently on her period and statse she has been passing large clots and has had episodes of diarrhea (per her norm when she is on her period). Pt is having lower abdominal pain as well. Pt is alert and oriented and has VS WDL per EMS.

## 2016-02-05 ENCOUNTER — Encounter: Payer: Self-pay | Admitting: Family

## 2016-02-05 ENCOUNTER — Ambulatory Visit (INDEPENDENT_AMBULATORY_CARE_PROVIDER_SITE_OTHER): Payer: Medicaid Other | Admitting: Family

## 2016-02-05 VITALS — BP 148/100 | HR 80 | Temp 98.0°F | Ht 65.0 in | Wt 339.0 lb

## 2016-02-05 DIAGNOSIS — F411 Generalized anxiety disorder: Secondary | ICD-10-CM

## 2016-02-05 DIAGNOSIS — I10 Essential (primary) hypertension: Secondary | ICD-10-CM

## 2016-02-05 MED ORDER — ESCITALOPRAM OXALATE 20 MG PO TABS: 20.0000 mg | ORAL_TABLET | Freq: Every day | ORAL | Status: DC

## 2016-02-05 MED ORDER — LEVOTHYROXINE SODIUM 50 MCG PO TABS: 50.0000 ug | ORAL_TABLET | Freq: Every day | ORAL | Status: DC

## 2016-02-05 MED ORDER — ATORVASTATIN CALCIUM 40 MG PO TABS: 40.0000 mg | ORAL_TABLET | Freq: Every day | ORAL | Status: DC

## 2016-02-05 NOTE — Patient Instructions (Signed)
Hypertension Hypertension, commonly called high blood pressure, is when the force of blood pumping through your arteries is too strong. Your arteries are the blood vessels that carry blood from your heart throughout your body. A blood pressure reading consists of a higher number over a lower number, such as 110/72. The higher number (systolic) is the pressure inside your arteries when your heart pumps. The lower number (diastolic) is the pressure inside your arteries when your heart relaxes. Ideally you want your blood pressure below 120/80. Hypertension forces your heart to work harder to pump blood. Your arteries may become narrow or stiff. Having untreated or uncontrolled hypertension can cause heart attack, stroke, kidney disease, and other problems. RISK FACTORS Some risk factors for high blood pressure are controllable. Others are not.  Risk factors you cannot control include:   Race. You may be at higher risk if you are African American.  Age. Risk increases with age.  Gender. Men are at higher risk than women before age 45 years. After age 65, women are at higher risk than men. Risk factors you can control include:  Not getting enough exercise or physical activity.  Being overweight.  Getting too much fat, sugar, calories, or salt in your diet.  Drinking too much alcohol. SIGNS AND SYMPTOMS Hypertension does not usually cause signs or symptoms. Extremely high blood pressure (hypertensive crisis) may cause headache, anxiety, shortness of breath, and nosebleed. DIAGNOSIS To check if you have hypertension, your health care provider will measure your blood pressure while you are seated, with your arm held at the level of your heart. It should be measured at least twice using the same arm. Certain conditions can cause a difference in blood pressure between your right and left arms. A blood pressure reading that is higher than normal on one occasion does not mean that you need treatment. If  it is not clear whether you have high blood pressure, you may be asked to return on a different day to have your blood pressure checked again. Or, you may be asked to monitor your blood pressure at home for 1 or more weeks. TREATMENT Treating high blood pressure includes making lifestyle changes and possibly taking medicine. Living a healthy lifestyle can help lower high blood pressure. You may need to change some of your habits. Lifestyle changes may include:  Following the DASH diet. This diet is high in fruits, vegetables, and whole grains. It is low in salt, red meat, and added sugars.  Keep your sodium intake below 2,300 mg per day.  Getting at least 30-45 minutes of aerobic exercise at least 4 times per week.  Losing weight if necessary.  Not smoking.  Limiting alcoholic beverages.  Learning ways to reduce stress. Your health care provider may prescribe medicine if lifestyle changes are not enough to get your blood pressure under control, and if one of the following is true:  You are 18-59 years of age and your systolic blood pressure is above 140.  You are 60 years of age or older, and your systolic blood pressure is above 150.  Your diastolic blood pressure is above 90.  You have diabetes, and your systolic blood pressure is over 140 or your diastolic blood pressure is over 90.  You have kidney disease and your blood pressure is above 140/90.  You have heart disease and your blood pressure is above 140/90. Your personal target blood pressure may vary depending on your medical conditions, your age, and other factors. HOME CARE INSTRUCTIONS    Have your blood pressure rechecked as directed by your health care provider.   Take medicines only as directed by your health care provider. Follow the directions carefully. Blood pressure medicines must be taken as prescribed. The medicine does not work as well when you skip doses. Skipping doses also puts you at risk for  problems.  Do not smoke.   Monitor your blood pressure at home as directed by your health care provider. SEEK MEDICAL CARE IF:   You think you are having a reaction to medicines taken.  You have recurrent headaches or feel dizzy.  You have swelling in your ankles.  You have trouble with your vision. SEEK IMMEDIATE MEDICAL CARE IF:  You develop a severe headache or confusion.  You have unusual weakness, numbness, or feel faint.  You have severe chest or abdominal pain.  You vomit repeatedly.  You have trouble breathing. MAKE SURE YOU:   Understand these instructions.  Will watch your condition.  Will get help right away if you are not doing well or get worse.   This information is not intended to replace advice given to you by your health care provider. Make sure you discuss any questions you have with your health care provider.   Document Released: 12/06/2005 Document Revised: 04/22/2015 Document Reviewed: 09/28/2013 Elsevier Interactive Patient Education 2016 [employer] Generalized Anxiety Disorder Generalized anxiety disorder (GAD) is a mental disorder. It interferes with life functions, including relationships, work, and school. GAD is different from normal anxiety, which everyone experiences at some point in their lives in response to specific life events and activities. Normal anxiety actually helps us prepare for and get through these life events and activities. Normal anxiety goes away after the event or activity is over.  GAD causes anxiety that is not necessarily related to specific events or activities. It also causes excess anxiety in proportion to specific events or activities. The anxiety associated with GAD is also difficult to control. GAD can vary from mild to severe. People with severe GAD can have intense waves of anxiety with physical symptoms (panic attacks).  SYMPTOMS The anxiety and worry associated with GAD are difficult to control. This anxiety  and worry are related to many life events and activities and also occur more days than not for 6 months or longer. People with GAD also have three or more of the following symptoms (one or more in children):  Restlessness.   Fatigue.  Difficulty concentrating.   Irritability.  Muscle tension.  Difficulty sleeping or unsatisfying sleep. DIAGNOSIS GAD is diagnosed through an assessment by your health care provider. Your health care provider will ask you questions aboutyour mood,physical symptoms, and events in your life. Your health care provider may ask you about your medical history and use of alcohol or drugs, including prescription medicines. Your health care provider may also do a physical exam and blood tests. Certain medical conditions and the use of certain substances can cause symptoms similar to those associated with GAD. Your health care provider may refer you to a mental health specialist for further evaluation. TREATMENT The following therapies are usually used to treat GAD:   Medication. Antidepressant medication usually is prescribed for long-term daily control. Antianxiety medicines may be added in severe cases, especially when panic attacks occur.   Talk therapy (psychotherapy). Certain types of talk therapy can be helpful in treating GAD by providing support, education, and guidance. A form of talk therapy called cognitive behavioral therapy can teach you healthy ways to think   about and react to daily life events and activities.  Stress managementtechniques. These include yoga, meditation, and exercise and can be very helpful when they are practiced regularly. A mental health specialist can help determine which treatment is best for you. Some people see improvement with one therapy. However, other people require a combination of therapies.   This information is not intended to replace advice given to you by your health care provider. Make sure you discuss any questions  you have with your health care provider.   Document Released: 04/02/2013 Document Revised: 12/27/2014 Document Reviewed: 04/02/2013 Elsevier Interactive Patient Education 2016 [employer] Inc[employer]  

## 2016-02-05 NOTE — Progress Notes (Signed)
Subjective:    Patient ID: Amber Colon, female    DOB: [birth year], 35 y.o.   MRN: [id number]  Pt presents to the office today to recheck HTN. PT's BP is not at goal. PT states she has not taken her HCTZ  Since Monday.  Hypertension This is a chronic problem. The current episode started more than 1 year ago. The problem has been waxing and waning since onset. The problem is uncontrolled. Associated symptoms include anxiety and palpitations. Pertinent negatives include no headaches, peripheral edema or shortness of breath. Risk factors for coronary artery disease include family history. Past treatments include diuretics. The current treatment provides mild improvement. There is no history of kidney disease, CAD/MI, CVA, heart failure or a thyroid problem. There is no history of sleep apnea.  Anxiety Presents for follow-up visit. Symptoms include depressed mood, excessive worry, irritability, nervous/anxious behavior, palpitations and panic. Patient reports no shortness of breath. Symptoms occur most days. The symptoms are aggravated by family issues. The quality of sleep is fair.   Her past medical history is significant for anxiety/panic attacks. Past treatments include SSRIs. Compliance with prior treatments has been good.      Review of Systems  Constitutional: Positive for irritability.  HENT: Negative.   Eyes: Negative.   Respiratory: Negative.  Negative for shortness of breath.   Cardiovascular: Positive for palpitations.  Gastrointestinal: Negative.   Endocrine: Negative.   Genitourinary: Negative.   Musculoskeletal: Negative.   Neurological: Negative.  Negative for headaches.  Hematological: Negative.   Psychiatric/Behavioral: The patient is nervous/anxious.   All other systems reviewed and are negative.      Objective:   Physical Exam  Constitutional: She is oriented to person, place, and time. She appears well-developed and well-nourished. No distress.  HENT:  Head:  Normocephalic and atraumatic.  Eyes: Pupils are equal, round, and reactive to light.  Neck: Normal range of motion. Neck supple. No thyromegaly present.  Cardiovascular: Normal rate, regular rhythm, normal heart sounds and intact distal pulses.   No murmur heard. Pulmonary/Chest: Effort normal and breath sounds normal. No respiratory distress. She has no wheezes.  Abdominal: Soft. Bowel sounds are normal. She exhibits no distension. There is no tenderness.  Musculoskeletal: Normal range of motion. She exhibits no edema or tenderness.  Neurological: She is alert and oriented to person, place, and time. She has normal reflexes. No cranial nerve deficit.  Skin: Skin is warm and dry.  Psychiatric: She has a normal mood and affect. Her behavior is normal. Judgment and thought content normal.  Vitals reviewed.   BP 148/100 mmHg  Pulse 80  Temp(Src) 98 F (36.7 C) (Oral)  Ht '5\' 5"'$  (1.651 m)  Wt 339 lb (153.769 kg)  BMI 56.41 kg/m2  LMP 01/29/2016      Assessment & Plan:  1. Essential hypertension -PT told to restart HCTZ -Daily blood pressure log given with instructions on how to fill out and told to bring to next visit -Dash diet information given -Exercise encouraged - Stress Management  -Continue current meds -RTO in 1 weeks - BMP8+EGFR  2. GAD (generalized anxiety disorder) -Lexapro increased to 20 mg  -Pt told to take at bedtime because pt states she feels "slugish". -Stress management discussed - escitalopram (LEXAPRO) 20 MG tablet; Take 1 tablet (20 mg total) by mouth daily.  Dispense: 30 tablet; Refill: 5 - BMP8+EGFR   Continue all meds Labs pending Health Maintenance reviewed Diet and exercise encouraged RTO 1 week   Evelina Dun,  FNP

## 2016-02-06 ENCOUNTER — Other Ambulatory Visit: Payer: Self-pay | Admitting: Family

## 2016-02-06 LAB — BMP8+EGFR
BUN/Creatinine Ratio: 12 (ref 8–20)
BUN: 11 mg/dL (ref 6–20)
CALCIUM: 10 mg/dL (ref 8.7–10.2)
CO2: 29 mmol/L (ref 18–29)
Chloride: 93 mmol/L — ABNORMAL LOW (ref 96–106)
Creatinine, Ser: 0.95 mg/dL (ref 0.57–1.00)
GFR, EST AFRICAN AMERICAN: 90 mL/min/{1.73_m2} (ref >59)
GFR, EST NON AFRICAN AMERICAN: 78 mL/min/{1.73_m2} (ref >59)
Glucose: 98 mg/dL (ref 65–99)
POTASSIUM: 3.2 mmol/L — AB (ref 3.5–5.2)
SODIUM: 137 mmol/L (ref 134–144)

## 2016-02-06 MED ORDER — POTASSIUM CHLORIDE ER 20 MEQ PO TBCR: 1.0000 | EXTENDED_RELEASE_TABLET | Freq: Every day | ORAL | Status: DC

## 2016-02-12 ENCOUNTER — Ambulatory Visit (INDEPENDENT_AMBULATORY_CARE_PROVIDER_SITE_OTHER): Payer: Medicaid Other | Admitting: Family

## 2016-02-12 ENCOUNTER — Encounter: Payer: Self-pay | Admitting: Family

## 2016-02-12 VITALS — BP 130/94 | HR 97 | Temp 97.6°F | Ht 65.0 in | Wt 345.0 lb

## 2016-02-12 DIAGNOSIS — Z23 Encounter for immunization: Secondary | ICD-10-CM

## 2016-02-12 DIAGNOSIS — I10 Essential (primary) hypertension: Secondary | ICD-10-CM

## 2016-02-12 DIAGNOSIS — K219 Gastro-esophageal reflux disease without esophagitis: Secondary | ICD-10-CM

## 2016-02-12 MED ORDER — OMEPRAZOLE 20 MG PO CPDR: 20.0000 mg | DELAYED_RELEASE_CAPSULE | Freq: Two times a day (BID) | ORAL | Status: DC

## 2016-02-12 MED ORDER — LISINOPRIL 10 MG PO TABS: 10.0000 mg | ORAL_TABLET | Freq: Every day | ORAL | Status: DC

## 2016-02-12 NOTE — Addendum Note (Signed)
Addended by: Almeta Monas on: 02/12/2016 10:51 AM   Modules accepted: Orders, SmartSet

## 2016-02-12 NOTE — Addendum Note (Signed)
Addended by: Jannifer Rodney A on: 02/12/2016 10:27 AM   Modules accepted: SmartSet

## 2016-02-12 NOTE — Patient Instructions (Signed)
Gastroesophageal Reflux Disease, Adult Normally, food travels down the esophagus and stays in the stomach to be digested. However, when a person has gastroesophageal reflux disease (GERD), food and stomach acid move back up into the esophagus. When this happens, the esophagus becomes sore and inflamed. Over time, GERD can create small holes (ulcers) in the lining of the esophagus.  CAUSES This condition is caused by a problem with the muscle between the esophagus and the stomach (lower esophageal sphincter, or LES). Normally, the LES muscle closes after food passes through the esophagus to the stomach. When the LES is weakened or abnormal, it does not close properly, and that allows food and stomach acid to go back up into the esophagus. The LES can be weakened by certain dietary substances, medicines, and medical conditions, including:  Tobacco use.  Pregnancy.  Having a hiatal hernia.  Heavy alcohol use.  Certain foods and beverages, such as coffee, chocolate, onions, and peppermint. RISK FACTORS This condition is more likely to develop in:  People who have an increased body weight.  People who have connective tissue disorders.  People who use NSAID medicines. SYMPTOMS Symptoms of this condition include:  Heartburn.  Difficult or painful swallowing.  The feeling of having a lump in the throat.  Abitter taste in the mouth.  Bad breath.  Having a large amount of saliva.  Having an upset or bloated stomach.  Belching.  Chest pain.  Shortness of breath or wheezing.  Ongoing (chronic) cough or a night-time cough.  Wearing away of tooth enamel.  Weight loss. Different conditions can cause chest pain. Make sure to see your health care provider if you experience chest pain. DIAGNOSIS Your health care provider will take a medical history and perform a physical exam. To determine if you have mild or severe GERD, your health care provider may also monitor how you respond  to treatment. You may also have other tests, including:  An endoscopy toexamine your stomach and esophagus with a small camera.  A test thatmeasures the acidity level in your esophagus.  A test thatmeasures how much pressure is on your esophagus.  A barium swallow or modified barium swallow to show the shape, size, and functioning of your esophagus. TREATMENT The goal of treatment is to help relieve your symptoms and to prevent complications. Treatment for this condition may vary depending on how severe your symptoms are. Your health care provider may recommend:  Changes to your diet.  Medicine.  Surgery. HOME CARE INSTRUCTIONS Diet  Follow a diet as recommended by your health care provider. This may involve avoiding foods and drinks such as:  Coffee and tea (with or without caffeine).  Drinks that containalcohol.  Energy drinks and sports drinks.  Carbonated drinks or sodas.  Chocolate and cocoa.  Peppermint and mint flavorings.  Garlic and onions.  Horseradish.  Spicy and acidic foods, including peppers, chili powder, curry powder, vinegar, hot sauces, and barbecue sauce.  Citrus fruit juices and citrus fruits, such as oranges, lemons, and limes.  Tomato-based foods, such as red sauce, chili, salsa, and pizza with red sauce.  Fried and fatty foods, such as donuts, french fries, potato chips, and high-fat dressings.  High-fat meats, such as hot dogs and fatty cuts of red and white meats, such as rib eye steak, sausage, ham, and bacon.  High-fat dairy items, such as whole milk, butter, and cream cheese.  Eat small, frequent meals instead of large meals.  Avoid drinking large amounts of liquid with your   meals.  Avoid eating meals during the 2-3 hours before bedtime.  Avoid lying down right after you eat.  Do not exercise right after you eat. General Instructions  Pay attention to any changes in your symptoms.  Take over-the-counter and prescription  medicines only as told by your health care provider. Do not take aspirin, ibuprofen, or other NSAIDs unless your health care provider told you to do so.  Do not use any tobacco products, including cigarettes, chewing tobacco, and e-cigarettes. If you need help quitting, ask your health care provider.  Wear loose-fitting clothing. Do not wear anything tight around your waist that causes pressure on your abdomen.  Raise (elevate) the head of your bed 6 inches (15cm).  Try to reduce your stress, such as with yoga or meditation. If you need help reducing stress, ask your health care provider.  If you are overweight, reduce your weight to an amount that is healthy for you. Ask your health care provider for guidance about a safe weight loss goal.  Keep all follow-up visits as told by your health care provider. This is important. SEEK MEDICAL CARE IF:  You have new symptoms.  You have unexplained weight loss.  You have difficulty swallowing, or it hurts to swallow.  You have wheezing or a persistent cough.  Your symptoms do not improve with treatment.  You have a hoarse voice. SEEK IMMEDIATE MEDICAL CARE IF:  You have pain in your arms, neck, jaw, teeth, or back.  You feel sweaty, dizzy, or light-headed.  You have chest pain or shortness of breath.  You vomit and your vomit looks like blood or coffee grounds.  You faint.  Your stool is bloody or black.  You cannot swallow, drink, or eat.   This information is not intended to replace advice given to you by your health care provider. Make sure you discuss any questions you have with your health care provider.   Document Released: 09/15/2005 Document Revised: 08/27/2015 Document Reviewed: 04/02/2015 Elsevier Interactive Patient Education 2016 [employer] Inc[employer] Food Choices for Gastroesophageal Reflux Disease, Adult When you have gastroesophageal reflux disease (GERD), the foods you eat and your eating habits are very important.  Choosing the right foods can help ease the discomfort of GERD. WHAT GENERAL GUIDELINES DO I NEED TO FOLLOW?  Choose fruits, vegetables, whole grains, low-fat dairy products, and low-fat meat, fish, and poultry.  Limit fats such as oils, salad dressings, butter, nuts, and avocado.  Keep a food diary to identify foods that cause symptoms.  Avoid foods that cause reflux. These may be different for different people.  Eat frequent small meals instead of three large meals each day.  Eat your meals slowly, in a relaxed setting.  Limit fried foods.  Cook foods using methods other than frying.  Avoid drinking alcohol.  Avoid drinking large amounts of liquids with your meals.  Avoid bending over or lying down until 2-3 hours after eating. WHAT FOODS ARE NOT RECOMMENDED? The following are some foods and drinks that may worsen your symptoms: Vegetables Tomatoes. Tomato juice. Tomato and spaghetti sauce. Chili peppers. Onion and garlic. Horseradish. Fruits Oranges, grapefruit, and lemon (fruit and juice). Meats High-fat meats, fish, and poultry. This includes hot dogs, ribs, ham, sausage, salami, and bacon. Dairy Whole milk and chocolate milk. Sour cream. Cream. Butter. Ice cream. Cream cheese.  Beverages Coffee and tea, with or without caffeine. Carbonated beverages or energy drinks. Condiments Hot sauce. Barbecue sauce.  Sweets/Desserts Chocolate and cocoa. Donuts. Peppermint and spearmint.   Fats and Oils High-fat foods, including French fries and potato chips. Other Vinegar. Strong spices, such as black pepper, white pepper, red pepper, cayenne, curry powder, cloves, ginger, and chili powder. The items listed above may not be a complete list of foods and beverages to avoid. Contact your dietitian for more information.   This information is not intended to replace advice given to you by your health care provider. Make sure you discuss any questions you have with your health care  provider.   Document Released: 12/06/2005 Document Revised: 12/27/2014 Document Reviewed: 10/10/2013 Elsevier Interactive Patient Education 2016 [employer] Inc.  

## 2016-02-12 NOTE — Progress Notes (Addendum)
Subjective:    Patient ID: Amber Colon, female    DOB: [birth year], 35 y.o.   MRN: [id number]  PT presents to the office today to recheck HTN. PT's BP is not at goal today. Since our last visit pt states she restarted her HCTZ 25 mg daily. PT is also complaining of GERD symptoms at night.  Hypertension This is a chronic problem. The current episode started more than 1 year ago. The problem is unchanged. The problem is uncontrolled. Associated symptoms include headaches and palpitations. Pertinent negatives include no anxiety, peripheral edema or shortness of breath. Risk factors for coronary artery disease include sedentary lifestyle, obesity, smoking/tobacco exposure, dyslipidemia and family history. Past treatments include diuretics. The current treatment provides mild improvement. There is no history of kidney disease, CAD/MI, CVA, heart failure or a thyroid problem. There is no history of sleep apnea.  Gastroesophageal Reflux She complains of heartburn. She reports no coughing, no dysphagia, no hoarse voice, no sore throat or no wheezing. This is a chronic problem. The current episode started more than 1 year ago. The problem occurs occasionally. The problem has been waxing and waning. The heartburn is located in the substernum. The heartburn wakes her from sleep. The symptoms are aggravated by certain foods, lying down, smoking and stress. Risk factors include obesity. She has tried a PPI for the symptoms. The treatment provided mild relief.      Review of Systems  Constitutional: Negative.   HENT: Negative for hoarse voice and sore throat.   Eyes: Negative.   Respiratory: Negative.  Negative for cough, shortness of breath and wheezing.   Cardiovascular: Positive for palpitations.  Gastrointestinal: Positive for heartburn. Negative for dysphagia.  Endocrine: Negative.   Genitourinary: Negative.   Musculoskeletal: Negative.   Neurological: Positive for headaches.  Hematological:  Negative.   Psychiatric/Behavioral: Negative.   All other systems reviewed and are negative.      Objective:   Physical Exam  Constitutional: She is oriented to person, place, and time. She appears well-developed and well-nourished. No distress.  Morbid obese   HENT:  Head: Normocephalic and atraumatic.  Eyes: Pupils are equal, round, and reactive to light.  Neck: Normal range of motion. Neck supple. No thyromegaly present.  Cardiovascular: Normal rate, regular rhythm, normal heart sounds and intact distal pulses.   No murmur heard. Pulmonary/Chest: Effort normal and breath sounds normal. No respiratory distress. She has no wheezes.  Abdominal: Soft. Bowel sounds are normal. She exhibits no distension. There is no tenderness.  Musculoskeletal: Normal range of motion. She exhibits no edema.  Neurological: She is alert and oriented to person, place, and time. She has normal reflexes. No cranial nerve deficit.  Skin: Skin is warm and dry.  Psychiatric: She has a normal mood and affect. Her behavior is normal. Judgment and thought content normal.  Vitals reviewed.   BP 130/94 mmHg  Pulse 97  Temp(Src) 97.6 F (36.4 C) (Oral)  Ht '5\' 5"'  (1.651 m)  Wt 345 lb (156.491 kg)  BMI 57.41 kg/m2  LMP 01/29/2016       Assessment & Plan:  1. Essential hypertension -Dash diet information given -Exercise encouraged - Stress Management  -Continue current meds -RTO in 2 weeks - lisinopril (PRINIVIL,ZESTRIL) 10 MG tablet; Take 1 tablet (10 mg total) by mouth daily.  Dispense: 90 tablet; Refill: 3 - BMP8+EGFR  2. Gastroesophageal reflux disease, esophagitis presence not specified -Prilosec increased to 20 mg BID from 20 mg daily today -Diet discussed -Avoid eating  3 hours before bedtime -Elevated bed if possible - omeprazole (PRILOSEC) 20 MG capsule; Take 1 capsule (20 mg total) by mouth 2 (two) times daily before a meal.  Dispense: 180 capsule; Refill: 1 - BMP8+EGFR   Continue all  meds Labs pending Health Maintenance reviewed- TDAP given today Diet and exercise encouraged RTO 2 weeks to recheck HTN and GERD  Evelina Dun, FNP

## 2016-02-12 NOTE — Addendum Note (Signed)
Addended by: Orma Render F on: 02/12/2016 10:38 AM   Modules accepted: Kipp Brood

## 2016-02-13 LAB — BMP8+EGFR
BUN/Creatinine Ratio: 8 (ref 8–20)
BUN: 8 mg/dL (ref 6–20)
CALCIUM: 9.6 mg/dL (ref 8.7–10.2)
CHLORIDE: 98 mmol/L (ref 96–106)
CO2: 23 mmol/L (ref 18–29)
CREATININE: 0.96 mg/dL (ref 0.57–1.00)
GFR calc non Af Amer: 77 mL/min/{1.73_m2} (ref >59)
GFR, EST AFRICAN AMERICAN: 89 mL/min/{1.73_m2} (ref >59)
Glucose: 98 mg/dL (ref 65–99)
Potassium: 4.4 mmol/L (ref 3.5–5.2)
Sodium: 137 mmol/L (ref 134–144)

## 2016-02-27 ENCOUNTER — Ambulatory Visit (INDEPENDENT_AMBULATORY_CARE_PROVIDER_SITE_OTHER): Payer: Medicaid Other | Admitting: Family

## 2016-02-27 ENCOUNTER — Encounter: Payer: Self-pay | Admitting: Family

## 2016-02-27 VITALS — BP 151/106 | HR 97 | Temp 98.2°F | Ht 65.0 in | Wt 349.6 lb

## 2016-02-27 DIAGNOSIS — Z720 Tobacco use: Secondary | ICD-10-CM

## 2016-02-27 DIAGNOSIS — I10 Essential (primary) hypertension: Secondary | ICD-10-CM | POA: Diagnosis not present

## 2016-02-27 DIAGNOSIS — K219 Gastro-esophageal reflux disease without esophagitis: Secondary | ICD-10-CM | POA: Diagnosis not present

## 2016-02-27 DIAGNOSIS — F17200 Nicotine dependence, unspecified, uncomplicated: Secondary | ICD-10-CM | POA: Insufficient documentation

## 2016-02-27 DIAGNOSIS — Z6841 Body Mass Index (BMI) 40.0 and over, adult: Secondary | ICD-10-CM

## 2016-02-27 MED ORDER — LISINOPRIL 20 MG PO TABS: 20.0000 mg | ORAL_TABLET | Freq: Every day | ORAL | Status: DC

## 2016-02-27 MED ORDER — VARENICLINE TARTRATE 1 MG PO TABS
1.0000 mg | ORAL_TABLET | Freq: Two times a day (BID) | ORAL | Status: DC
Start: 2016-02-27 — End: 2016-06-25

## 2016-02-27 MED ORDER — VARENICLINE TARTRATE 0.5 MG X 11 & 1 MG X 42 PO MISC: ORAL | Status: DC

## 2016-02-27 NOTE — Progress Notes (Signed)
Subjective:    Patient ID: Amber Colon, female    DOB: [birth year], 35 y.o.   MRN: [id number]  PT presents to the office today to recheck HTN and GERD. Pt's BP is not at goal today. PT reports her her GERD is much better since starting the omeprazole 20 mg daily.  Hypertension This is a chronic problem. The current episode started more than 1 year ago. The problem has been waxing and waning since onset. The problem is uncontrolled. Pertinent negatives include no anxiety, blurred vision, headaches, malaise/fatigue, palpitations, peripheral edema or shortness of breath. Risk factors for coronary artery disease include dyslipidemia and obesity. Past treatments include ACE inhibitors. The current treatment provides moderate improvement. There is no history of kidney disease, CAD/MI, CVA, heart failure or a thyroid problem. There is no history of sleep apnea.  Gastroesophageal Reflux She reports no coughing, no heartburn, no sore throat or no wheezing. This is a chronic problem. The current episode started more than 1 month ago. The problem occurs rarely. The problem has been resolved. The symptoms are aggravated by certain foods and lying down. She has tried a PPI for the symptoms. The treatment provided moderate relief.      Review of Systems  Constitutional: Negative.  Negative for malaise/fatigue.  HENT: Negative.  Negative for sore throat.   Eyes: Negative.  Negative for blurred vision.  Respiratory: Negative.  Negative for cough, shortness of breath and wheezing.   Cardiovascular: Negative.  Negative for palpitations.  Gastrointestinal: Negative.  Negative for heartburn.  Endocrine: Negative.   Genitourinary: Negative.   Musculoskeletal: Negative.   Neurological: Negative.  Negative for headaches.  Hematological: Negative.   Psychiatric/Behavioral: Negative.   All other systems reviewed and are negative.      Objective:   Physical Exam  Constitutional: She is oriented to person,  place, and time. She appears well-developed and well-nourished. No distress.  Morbid obese   HENT:  Head: Normocephalic and atraumatic.  Eyes: Pupils are equal, round, and reactive to light.  Neck: Normal range of motion. Neck supple. No thyromegaly present.  Cardiovascular: Normal rate, regular rhythm, normal heart sounds and intact distal pulses.   No murmur heard. Pulmonary/Chest: Effort normal and breath sounds normal. No respiratory distress. She has no wheezes.  Abdominal: Soft. Bowel sounds are normal. She exhibits no distension. There is no tenderness.  Musculoskeletal: Normal range of motion. She exhibits no edema or tenderness.  Neurological: She is alert and oriented to person, place, and time. She has normal reflexes. No cranial nerve deficit.  Skin: Skin is warm and dry.  Psychiatric: She has a normal mood and affect. Her behavior is normal. Judgment and thought content normal.  Vitals reviewed.   BP 151/106 mmHg  Pulse 97  Temp(Src) 98.2 F (36.8 C) (Oral)  Ht '5\' 5"'  (1.651 m)  Wt 349 lb 9.6 oz (158.578 kg)  BMI 58.18 kg/m2  LMP 01/29/2016       Assessment & Plan:  1. Essential hypertension -Pt's Lisinopril increased to 20 mg from 10 mg -Dash diet information given -Exercise encouraged - Stress Management  -Continue current meds -RTO in 2 weeks - BMP8+EGFR - lisinopril (PRINIVIL,ZESTRIL) 20 MG tablet; Take 1 tablet (20 mg total) by mouth daily.  Dispense: 90 tablet; Refill: 3  2. Morbid obesity with BMI of 50.0-59.9, adult (HCC) -Weight loss discussed- Encouraged exercise and dieting  - BMP8+EGFR - Ambulatory referral to General Surgery  3. Gastroesophageal reflux disease, esophagitis presence not specified -  BMP8+EGFR  4. Current smoker -PT started on Chantix today -Discussed possible adverse reactions- PT told to stop if she has any suicide thoughts - BMP8+EGFR - varenicline (CHANTIX STARTING MONTH PAK) 0.5 MG X 11 & 1 MG X 42 tablet; Take one 0.5  mg tablet by mouth once daily for 3 days, then increase to one 0.5 mg tablet twice daily for 4 days, then increase to one 1 mg tablet twice daily.  Dispense: 53 tablet; Refill: 0 - varenicline (CHANTIX CONTINUING MONTH PAK) 1 MG tablet; Take 1 tablet (1 mg total) by mouth 2 (two) times daily.  Dispense: 30 tablet; Refill: 3   Continue all meds Labs pending Health Maintenance reviewed Diet and exercise encouraged RTO 2 weeks to recheck HTN  Evelina Dun, FNP

## 2016-02-27 NOTE — Patient Instructions (Signed)
DASH Eating Plan °DASH stands for "Dietary Approaches to Stop Hypertension." The DASH eating plan is a healthy eating plan that has been shown to reduce high blood pressure (hypertension). Additional health benefits may include reducing the risk of type 2 diabetes mellitus, heart disease, and stroke. The DASH eating plan may also help with weight loss. °WHAT DO I NEED TO KNOW ABOUT THE DASH EATING PLAN? °For the DASH eating plan, you will follow these general guidelines: °· Choose foods with a percent daily value for sodium of less than 5% (as listed on the food label). °· Use salt-free seasonings or herbs instead of table salt or sea salt. °· Check with your health care provider or pharmacist before using salt substitutes. °· Eat lower-sodium products, often labeled as "lower sodium" or "no salt added." °· Eat fresh foods. °· Eat more vegetables, fruits, and low-fat dairy products. °· Choose whole grains. Look for the word "whole" as the first word in the ingredient list. °· Choose fish and skinless chicken or turkey more often than red meat. Limit fish, poultry, and meat to 6 oz (170 g) each day. °· Limit sweets, desserts, sugars, and sugary drinks. °· Choose heart-healthy fats. °· Limit cheese to 1 oz (28 g) per day. °· Eat more home-cooked food and less restaurant, buffet, and fast food. °· Limit fried foods. °· Cook foods using methods other than frying. °· Limit canned vegetables. If you do use them, rinse them well to decrease the sodium. °· When eating at a restaurant, ask that your food be prepared with less salt, or no salt if possible. °WHAT FOODS CAN I EAT? °Seek help from a dietitian for individual calorie needs. °Grains °Whole grain or whole wheat bread. Brown rice. Whole grain or whole wheat pasta. Quinoa, bulgur, and whole grain cereals. Low-sodium cereals. Corn or whole wheat flour tortillas. Whole grain cornbread. Whole grain crackers. Low-sodium crackers. °Vegetables °Fresh or frozen vegetables  (raw, steamed, roasted, or grilled). Low-sodium or reduced-sodium tomato and vegetable juices. Low-sodium or reduced-sodium tomato sauce and paste. Low-sodium or reduced-sodium canned vegetables.  °Fruits °All fresh, canned (in natural juice), or frozen fruits. °Meat and Other Protein Products °Ground beef (85% or leaner), grass-fed beef, or beef trimmed of fat. Skinless chicken or turkey. Ground chicken or turkey. Pork trimmed of fat. All fish and seafood. Eggs. Dried beans, peas, or lentils. Unsalted nuts and seeds. Unsalted canned beans. °Dairy °Low-fat dairy products, such as skim or 1% milk, 2% or reduced-fat cheeses, low-fat ricotta or cottage cheese, or plain low-fat yogurt. Low-sodium or reduced-sodium cheeses. °Fats and Oils °Tub margarines without trans fats. Light or reduced-fat mayonnaise and salad dressings (reduced sodium). Avocado. Safflower, olive, or canola oils. Natural peanut or almond butter. °Other °Unsalted popcorn and pretzels. °The items listed above may not be a complete list of recommended foods or beverages. Contact your dietitian for more options. °WHAT FOODS ARE NOT RECOMMENDED? °Grains °White bread. White pasta. White rice. Refined cornbread. Bagels and croissants. Crackers that contain trans fat. °Vegetables °Creamed or fried vegetables. Vegetables in a cheese sauce. Regular canned vegetables. Regular canned tomato sauce and paste. Regular tomato and vegetable juices. °Fruits °Dried fruits. Canned fruit in light or heavy syrup. Fruit juice. °Meat and Other Protein Products °Fatty cuts of meat. Ribs, chicken wings, bacon, sausage, bologna, salami, chitterlings, fatback, hot dogs, bratwurst, and packaged luncheon meats. Salted nuts and seeds. Canned beans with salt. °Dairy °Whole or 2% milk, cream, half-and-half, and cream cheese. Whole-fat or sweetened yogurt. Full-fat   cheeses or blue cheese. Nondairy creamers and whipped toppings. Processed cheese, cheese spreads, or cheese  curds. °Condiments °Onion and garlic salt, seasoned salt, table salt, and sea salt. Canned and packaged gravies. Worcestershire sauce. Tartar sauce. Barbecue sauce. Teriyaki sauce. Soy sauce, including reduced sodium. Steak sauce. Fish sauce. Oyster sauce. Cocktail sauce. Horseradish. Ketchup and mustard. Meat flavorings and tenderizers. Bouillon cubes. Hot sauce. Tabasco sauce. Marinades. Taco seasonings. Relishes. °Fats and Oils °Butter, stick margarine, lard, shortening, ghee, and bacon fat. Coconut, palm kernel, or palm oils. Regular salad dressings. °Other °Pickles and olives. Salted popcorn and pretzels. °The items listed above may not be a complete list of foods and beverages to avoid. Contact your dietitian for more information. °WHERE CAN I FIND MORE INFORMATION? °[employer]: www.nhlbi.nih.gov/health/health-topics/topics/dash/ °  °This information is not intended to replace advice given to you by your health care provider. Make sure you discuss any questions you have with your health care provider. °  °Document Released: 11/25/2011 Document Revised: 12/27/2014 Document Reviewed: 10/10/2013 °Elsevier Interactive Patient Education ©2016 [employer] ° °Hypertension °Hypertension, commonly called high blood pressure, is when the force of blood pumping through your arteries is too strong. Your arteries are the blood vessels that carry blood from your heart throughout your body. A blood pressure reading consists of a higher number over a lower number, such as 110/72. The higher number (systolic) is the pressure inside your arteries when your heart pumps. The lower number (diastolic) is the pressure inside your arteries when your heart relaxes. Ideally you want your blood pressure below 120/80. °Hypertension forces your heart to work harder to pump blood. Your arteries may become narrow or stiff. Having untreated or uncontrolled hypertension can cause heart attack, stroke, kidney  disease, and other problems. °RISK FACTORS °Some risk factors for high blood pressure are controllable. Others are not.  °Risk factors you cannot control include:  °· Race. You may be at higher risk if you are African American. °· Age. Risk increases with age. °· Gender. Men are at higher risk than women before age 45 years. After age 65, women are at higher risk than men. °Risk factors you can control include: °· Not getting enough exercise or physical activity. °· Being overweight. °· Getting too much fat, sugar, calories, or salt in your diet. °· Drinking too much alcohol. °SIGNS AND SYMPTOMS °Hypertension does not usually cause signs or symptoms. Extremely high blood pressure (hypertensive crisis) may cause headache, anxiety, shortness of breath, and nosebleed. °DIAGNOSIS °To check if you have hypertension, your health care provider will measure your blood pressure while you are seated, with your arm held at the level of your heart. It should be measured at least twice using the same arm. Certain conditions can cause a difference in blood pressure between your right and left arms. A blood pressure reading that is higher than normal on one occasion does not mean that you need treatment. If it is not clear whether you have high blood pressure, you may be asked to return on a different day to have your blood pressure checked again. Or, you may be asked to monitor your blood pressure at home for 1 or more weeks. °TREATMENT °Treating high blood pressure includes making lifestyle changes and possibly taking medicine. Living a healthy lifestyle can help lower high blood pressure. You may need to change some of your habits. °Lifestyle changes may include: °· Following the DASH diet. This diet is high in fruits, vegetables, and whole   grains. It is low in salt, red meat, and added sugars. °· Keep your sodium intake below 2,300 mg per day. °· Getting at least 30-45 minutes of aerobic exercise at least 4 times per  week. °· Losing weight if necessary. °· Not smoking. °· Limiting alcoholic beverages. °· Learning ways to reduce stress. °Your health care provider may prescribe medicine if lifestyle changes are not enough to get your blood pressure under control, and if one of the following is true: °· You are 18-59 years of age and your systolic blood pressure is above 140. °· You are 60 years of age or older, and your systolic blood pressure is above 150. °· Your diastolic blood pressure is above 90. °· You have diabetes, and your systolic blood pressure is over 140 or your diastolic blood pressure is over 90. °· You have kidney disease and your blood pressure is above 140/90. °· You have heart disease and your blood pressure is above 140/90. °Your personal target blood pressure may vary depending on your medical conditions, your age, and other factors. °HOME CARE INSTRUCTIONS °· Have your blood pressure rechecked as directed by your health care provider.   °· Take medicines only as directed by your health care provider. Follow the directions carefully. Blood pressure medicines must be taken as prescribed. The medicine does not work as well when you skip doses. Skipping doses also puts you at risk for problems. °· Do not smoke.   °· Monitor your blood pressure at home as directed by your health care provider.  °SEEK MEDICAL CARE IF:  °· You think you are having a reaction to medicines taken. °· You have recurrent headaches or feel dizzy. °· You have swelling in your ankles. °· You have trouble with your vision. °SEEK IMMEDIATE MEDICAL CARE IF: °· You develop a severe headache or confusion. °· You have unusual weakness, numbness, or feel faint. °· You have severe chest or abdominal pain. °· You vomit repeatedly. °· You have trouble breathing. °MAKE SURE YOU:  °· Understand these instructions. °· Will watch your condition. °· Will get help right away if you are not doing well or get worse. °  °This information is not intended to  replace advice given to you by your health care provider. Make sure you discuss any questions you have with your health care provider. °  °Document Released: 12/06/2005 Document Revised: 04/22/2015 Document Reviewed: 09/28/2013 °Elsevier Interactive Patient Education ©2016 [employer] Inc. ° °

## 2016-02-28 LAB — SPECIMEN STATUS

## 2016-03-01 LAB — BMP8+EGFR
BUN/Creatinine Ratio: 11 (ref 8–20)
BUN: 11 mg/dL (ref 6–20)
CALCIUM: 10.2 mg/dL (ref 8.7–10.2)
CHLORIDE: 97 mmol/L (ref 96–106)
CO2: 24 mmol/L (ref 18–29)
CREATININE: 0.98 mg/dL (ref 0.57–1.00)
GFR calc Af Amer: 87 mL/min/{1.73_m2} (ref >59)
GFR calc non Af Amer: 76 mL/min/{1.73_m2} (ref >59)
GLUCOSE: 105 mg/dL — AB (ref 65–99)
Potassium: 3.9 mmol/L (ref 3.5–5.2)
Sodium: 140 mmol/L (ref 134–144)

## 2016-03-12 ENCOUNTER — Telehealth: Payer: Self-pay | Admitting: Family

## 2016-03-12 DIAGNOSIS — I10 Essential (primary) hypertension: Secondary | ICD-10-CM

## 2016-03-12 MED ORDER — LISINOPRIL 20 MG PO TABS
20.0000 mg | ORAL_TABLET | Freq: Every day | ORAL | Status: DC
Start: 2016-03-12 — End: 2016-03-15

## 2016-03-12 MED ORDER — HYDROCHLOROTHIAZIDE 25 MG PO TABS: 25.0000 mg | ORAL_TABLET | Freq: Every day | ORAL | Status: DC

## 2016-03-12 NOTE — Telephone Encounter (Signed)
done

## 2016-03-15 ENCOUNTER — Ambulatory Visit (INDEPENDENT_AMBULATORY_CARE_PROVIDER_SITE_OTHER): Payer: Medicaid Other | Admitting: Family

## 2016-03-15 ENCOUNTER — Encounter: Payer: Self-pay | Admitting: Family

## 2016-03-15 VITALS — BP 139/97 | HR 73 | Temp 98.2°F | Ht 65.0 in | Wt 355.2 lb

## 2016-03-15 DIAGNOSIS — I10 Essential (primary) hypertension: Secondary | ICD-10-CM

## 2016-03-15 MED ORDER — LISINOPRIL-HYDROCHLOROTHIAZIDE 20-12.5 MG PO TABS: 2.0000 | ORAL_TABLET | Freq: Every day | ORAL | Status: DC

## 2016-03-15 NOTE — Patient Instructions (Signed)
DASH Eating Plan °DASH stands for "Dietary Approaches to Stop Hypertension." The DASH eating plan is a healthy eating plan that has been shown to reduce high blood pressure (hypertension). Additional health benefits may include reducing the risk of type 2 diabetes mellitus, heart disease, and stroke. The DASH eating plan may also help with weight loss. °WHAT DO I NEED TO KNOW ABOUT THE DASH EATING PLAN? °For the DASH eating plan, you will follow these general guidelines: °· Choose foods with a percent daily value for sodium of less than 5% (as listed on the food label). °· Use salt-free seasonings or herbs instead of table salt or sea salt. °· Check with your health care provider or pharmacist before using salt substitutes. °· Eat lower-sodium products, often labeled as "lower sodium" or "no salt added." °· Eat fresh foods. °· Eat more vegetables, fruits, and low-fat dairy products. °· Choose whole grains. Look for the word "whole" as the first word in the ingredient list. °· Choose fish and skinless chicken or turkey more often than red meat. Limit fish, poultry, and meat to 6 oz (170 g) each day. °· Limit sweets, desserts, sugars, and sugary drinks. °· Choose heart-healthy fats. °· Limit cheese to 1 oz (28 g) per day. °· Eat more home-cooked food and less restaurant, buffet, and fast food. °· Limit fried foods. °· Cook foods using methods other than frying. °· Limit canned vegetables. If you do use them, rinse them well to decrease the sodium. °· When eating at a restaurant, ask that your food be prepared with less salt, or no salt if possible. °WHAT FOODS CAN I EAT? °Seek help from a dietitian for individual calorie needs. °Grains °Whole grain or whole wheat bread. Brown rice. Whole grain or whole wheat pasta. Quinoa, bulgur, and whole grain cereals. Low-sodium cereals. Corn or whole wheat flour tortillas. Whole grain cornbread. Whole grain crackers. Low-sodium crackers. °Vegetables °Fresh or frozen vegetables  (raw, steamed, roasted, or grilled). Low-sodium or reduced-sodium tomato and vegetable juices. Low-sodium or reduced-sodium tomato sauce and paste. Low-sodium or reduced-sodium canned vegetables.  °Fruits °All fresh, canned (in natural juice), or frozen fruits. °Meat and Other Protein Products °Ground beef (85% or leaner), grass-fed beef, or beef trimmed of fat. Skinless chicken or turkey. Ground chicken or turkey. Pork trimmed of fat. All fish and seafood. Eggs. Dried beans, peas, or lentils. Unsalted nuts and seeds. Unsalted canned beans. °Dairy °Low-fat dairy products, such as skim or 1% milk, 2% or reduced-fat cheeses, low-fat ricotta or cottage cheese, or plain low-fat yogurt. Low-sodium or reduced-sodium cheeses. °Fats and Oils °Tub margarines without trans fats. Light or reduced-fat mayonnaise and salad dressings (reduced sodium). Avocado. Safflower, olive, or canola oils. Natural peanut or almond butter. °Other °Unsalted popcorn and pretzels. °The items listed above may not be a complete list of recommended foods or beverages. Contact your dietitian for more options. °WHAT FOODS ARE NOT RECOMMENDED? °Grains °White bread. White pasta. White rice. Refined cornbread. Bagels and croissants. Crackers that contain trans fat. °Vegetables °Creamed or fried vegetables. Vegetables in a cheese sauce. Regular canned vegetables. Regular canned tomato sauce and paste. Regular tomato and vegetable juices. °Fruits °Dried fruits. Canned fruit in light or heavy syrup. Fruit juice. °Meat and Other Protein Products °Fatty cuts of meat. Ribs, chicken wings, bacon, sausage, bologna, salami, chitterlings, fatback, hot dogs, bratwurst, and packaged luncheon meats. Salted nuts and seeds. Canned beans with salt. °Dairy °Whole or 2% milk, cream, half-and-half, and cream cheese. Whole-fat or sweetened yogurt. Full-fat   cheeses or blue cheese. Nondairy creamers and whipped toppings. Processed cheese, cheese spreads, or cheese  curds. °Condiments °Onion and garlic salt, seasoned salt, table salt, and sea salt. Canned and packaged gravies. Worcestershire sauce. Tartar sauce. Barbecue sauce. Teriyaki sauce. Soy sauce, including reduced sodium. Steak sauce. Fish sauce. Oyster sauce. Cocktail sauce. Horseradish. Ketchup and mustard. Meat flavorings and tenderizers. Bouillon cubes. Hot sauce. Tabasco sauce. Marinades. Taco seasonings. Relishes. °Fats and Oils °Butter, stick margarine, lard, shortening, ghee, and bacon fat. Coconut, palm kernel, or palm oils. Regular salad dressings. °Other °Pickles and olives. Salted popcorn and pretzels. °The items listed above may not be a complete list of foods and beverages to avoid. Contact your dietitian for more information. °WHERE CAN I FIND MORE INFORMATION? °[employer]: www.nhlbi.nih.gov/health/health-topics/topics/dash/ °  °This information is not intended to replace advice given to you by your health care provider. Make sure you discuss any questions you have with your health care provider. °  °Document Released: 11/25/2011 Document Revised: 12/27/2014 Document Reviewed: 10/10/2013 °Elsevier Interactive Patient Education ©2016 [employer] ° °Hypertension °Hypertension, commonly called high blood pressure, is when the force of blood pumping through your arteries is too strong. Your arteries are the blood vessels that carry blood from your heart throughout your body. A blood pressure reading consists of a higher number over a lower number, such as 110/72. The higher number (systolic) is the pressure inside your arteries when your heart pumps. The lower number (diastolic) is the pressure inside your arteries when your heart relaxes. Ideally you want your blood pressure below 120/80. °Hypertension forces your heart to work harder to pump blood. Your arteries may become narrow or stiff. Having untreated or uncontrolled hypertension can cause heart attack, stroke, kidney  disease, and other problems. °RISK FACTORS °Some risk factors for high blood pressure are controllable. Others are not.  °Risk factors you cannot control include:  °· Race. You may be at higher risk if you are African American. °· Age. Risk increases with age. °· Gender. Men are at higher risk than women before age 45 years. After age 65, women are at higher risk than men. °Risk factors you can control include: °· Not getting enough exercise or physical activity. °· Being overweight. °· Getting too much fat, sugar, calories, or salt in your diet. °· Drinking too much alcohol. °SIGNS AND SYMPTOMS °Hypertension does not usually cause signs or symptoms. Extremely high blood pressure (hypertensive crisis) may cause headache, anxiety, shortness of breath, and nosebleed. °DIAGNOSIS °To check if you have hypertension, your health care provider will measure your blood pressure while you are seated, with your arm held at the level of your heart. It should be measured at least twice using the same arm. Certain conditions can cause a difference in blood pressure between your right and left arms. A blood pressure reading that is higher than normal on one occasion does not mean that you need treatment. If it is not clear whether you have high blood pressure, you may be asked to return on a different day to have your blood pressure checked again. Or, you may be asked to monitor your blood pressure at home for 1 or more weeks. °TREATMENT °Treating high blood pressure includes making lifestyle changes and possibly taking medicine. Living a healthy lifestyle can help lower high blood pressure. You may need to change some of your habits. °Lifestyle changes may include: °· Following the DASH diet. This diet is high in fruits, vegetables, and whole   grains. It is low in salt, red meat, and added sugars. °· Keep your sodium intake below 2,300 mg per day. °· Getting at least 30-45 minutes of aerobic exercise at least 4 times per  week. °· Losing weight if necessary. °· Not smoking. °· Limiting alcoholic beverages. °· Learning ways to reduce stress. °Your health care provider may prescribe medicine if lifestyle changes are not enough to get your blood pressure under control, and if one of the following is true: °· You are 18-59 years of age and your systolic blood pressure is above 140. °· You are 60 years of age or older, and your systolic blood pressure is above 150. °· Your diastolic blood pressure is above 90. °· You have diabetes, and your systolic blood pressure is over 140 or your diastolic blood pressure is over 90. °· You have kidney disease and your blood pressure is above 140/90. °· You have heart disease and your blood pressure is above 140/90. °Your personal target blood pressure may vary depending on your medical conditions, your age, and other factors. °HOME CARE INSTRUCTIONS °· Have your blood pressure rechecked as directed by your health care provider.   °· Take medicines only as directed by your health care provider. Follow the directions carefully. Blood pressure medicines must be taken as prescribed. The medicine does not work as well when you skip doses. Skipping doses also puts you at risk for problems. °· Do not smoke.   °· Monitor your blood pressure at home as directed by your health care provider.  °SEEK MEDICAL CARE IF:  °· You think you are having a reaction to medicines taken. °· You have recurrent headaches or feel dizzy. °· You have swelling in your ankles. °· You have trouble with your vision. °SEEK IMMEDIATE MEDICAL CARE IF: °· You develop a severe headache or confusion. °· You have unusual weakness, numbness, or feel faint. °· You have severe chest or abdominal pain. °· You vomit repeatedly. °· You have trouble breathing. °MAKE SURE YOU:  °· Understand these instructions. °· Will watch your condition. °· Will get help right away if you are not doing well or get worse. °  °This information is not intended to  replace advice given to you by your health care provider. Make sure you discuss any questions you have with your health care provider. °  °Document Released: 12/06/2005 Document Revised: 04/22/2015 Document Reviewed: 09/28/2013 °Elsevier Interactive Patient Education ©2016 [employer] Inc. ° °

## 2016-03-15 NOTE — Progress Notes (Signed)
   Subjective:    Patient ID: Amber Colon, female    DOB: [birth year], 35 y.o.   MRN: [id number]  Pt presents to the office today to recheck HTN. PT's BP is not at goal.  HPI    Review of Systems  Constitutional: Negative.   HENT: Negative.   Eyes: Negative.   Respiratory: Negative.  Negative for shortness of breath.   Cardiovascular: Negative.  Negative for palpitations.  Gastrointestinal: Negative.   Endocrine: Negative.   Genitourinary: Negative.   Musculoskeletal: Negative.   Neurological: Negative.  Negative for headaches.  Hematological: Negative.   Psychiatric/Behavioral: Negative.   All other systems reviewed and are negative.      Objective:   Physical Exam  Constitutional: She is oriented to person, place, and time. She appears well-developed and well-nourished. No distress.  Obese   HENT:  Head: Normocephalic and atraumatic.  Eyes: Pupils are equal, round, and reactive to light.  Neck: Normal range of motion. Neck supple. No thyromegaly present.  Cardiovascular: Normal rate, regular rhythm, normal heart sounds and intact distal pulses.   No murmur heard. Pulmonary/Chest: Effort normal and breath sounds normal. No respiratory distress. She has no wheezes.  Abdominal: Soft. Bowel sounds are normal. She exhibits no distension. There is no tenderness.  Musculoskeletal: Normal range of motion. She exhibits no edema or tenderness.  Neurological: She is alert and oriented to person, place, and time. She has normal reflexes. No cranial nerve deficit.  Skin: Skin is warm and dry.  Psychiatric: She has a normal mood and affect. Her behavior is normal. Judgment and thought content normal.  Vitals reviewed.     Blood pressure 139/97, pulse 73, temperature 98.2 F (36.8 C), temperature source Oral, height '5\' 5"'$  (1.651 m), weight 355 lb 3.2 oz (161.118 kg).     Assessment & Plan:  1. Essential hypertension -PT's lisinopril increased to 40 mg from 20 mg  today -Dash diet information given -Exercise encouraged - Stress Management  -Continue current meds -RTO in 2 weeks  - lisinopril-hydrochlorothiazide (ZESTORETIC) 20-12.5 MG tablet; Take 2 tablets by mouth daily.  Dispense: 180 tablet; Refill: 3 - BMP8+EGFR  Evelina Dun, FNP

## 2016-03-16 LAB — BMP8+EGFR
BUN/Creatinine Ratio: 18 (ref 8–20)
BUN: 13 mg/dL (ref 6–20)
CALCIUM: 9.1 mg/dL (ref 8.7–10.2)
CO2: 25 mmol/L (ref 18–29)
CREATININE: 0.74 mg/dL (ref 0.57–1.00)
Chloride: 100 mmol/L (ref 96–106)
GFR, EST AFRICAN AMERICAN: 122 mL/min/{1.73_m2} (ref >59)
GFR, EST NON AFRICAN AMERICAN: 106 mL/min/{1.73_m2} (ref >59)
Glucose: 100 mg/dL — ABNORMAL HIGH (ref 65–99)
POTASSIUM: 4.1 mmol/L (ref 3.5–5.2)
Sodium: 139 mmol/L (ref 134–144)

## 2016-03-25 ENCOUNTER — Ambulatory Visit (INDEPENDENT_AMBULATORY_CARE_PROVIDER_SITE_OTHER): Payer: Medicaid Other | Admitting: Family

## 2016-03-25 ENCOUNTER — Encounter (INDEPENDENT_AMBULATORY_CARE_PROVIDER_SITE_OTHER): Payer: Self-pay

## 2016-03-25 ENCOUNTER — Encounter: Payer: Self-pay | Admitting: *Deleted

## 2016-03-25 ENCOUNTER — Encounter: Payer: Self-pay | Admitting: Family

## 2016-03-25 VITALS — BP 130/85 | HR 103 | Temp 98.0°F | Ht 65.0 in | Wt 352.4 lb

## 2016-03-25 DIAGNOSIS — H6692 Otitis media, unspecified, left ear: Secondary | ICD-10-CM

## 2016-03-25 DIAGNOSIS — J0100 Acute maxillary sinusitis, unspecified: Secondary | ICD-10-CM | POA: Diagnosis not present

## 2016-03-25 DIAGNOSIS — R6889 Other general symptoms and signs: Secondary | ICD-10-CM

## 2016-03-25 LAB — VERITOR FLU A/B WAIVED
INFLUENZA B: NEGATIVE
Influenza A: NEGATIVE

## 2016-03-25 MED ORDER — DOXYCYCLINE HYCLATE 100 MG PO TABS: 100.0000 mg | ORAL_TABLET | Freq: Two times a day (BID) | ORAL | Status: DC

## 2016-03-25 MED ORDER — OLOPATADINE HCL 0.2 % OP SOLN: 1.0000 [drp] | Freq: Every day | OPHTHALMIC | Status: DC

## 2016-03-25 MED ORDER — FLUTICASONE PROPIONATE 50 MCG/ACT NA SUSP: 2.0000 | Freq: Every day | NASAL | Status: DC

## 2016-03-25 NOTE — Patient Instructions (Signed)
Otitis Media, Adult  Otitis media is redness, soreness, and inflammation of the middle ear. Otitis media may be caused by allergies or, most commonly, by infection. Often it occurs as a complication of the common cold.  SIGNS AND SYMPTOMS  Symptoms of otitis media may include:   Earache.   Fever.   Ringing in your ear.   Headache.   Leakage of fluid from the ear.  DIAGNOSIS  To diagnose otitis media, your health care provider will examine your ear with an otoscope. This is an instrument that allows your health care provider to see into your ear in order to examine your eardrum. Your health care provider also will ask you questions about your symptoms.  TREATMENT   Typically, otitis media resolves on its own within 3-5 days. Your health care provider may prescribe medicine to ease your symptoms of pain. If otitis media does not resolve within 5 days or is recurrent, your health care provider may prescribe antibiotic medicines if he or she suspects that a bacterial infection is the cause.  HOME CARE INSTRUCTIONS    If you were prescribed an antibiotic medicine, finish it all even if you start to feel better.   Take medicines only as directed by your health care provider.   Keep all follow-up visits as directed by your health care provider.  SEEK MEDICAL CARE IF:   You have otitis media only in one ear, or bleeding from your nose, or both.   You notice a lump on your neck.   You are not getting better in 3-5 days.   You feel worse instead of better.  SEEK IMMEDIATE MEDICAL CARE IF:    You have pain that is not controlled with medicine.   You have swelling, redness, or pain around your ear or stiffness in your neck.   You notice that part of your face is paralyzed.   You notice that the bone behind your ear (mastoid) is tender when you touch it.  MAKE SURE YOU:    Understand these instructions.   Will watch your condition.   Will get help right away if you are not doing well or get worse.     This  information is not intended to replace advice given to you by your health care provider. Make sure you discuss any questions you have with your health care provider.     Document Released: 09/10/2004 Document Revised: 12/27/2014 Document Reviewed: 07/03/2013  Elsevier Interactive Patient Education 2016 [employer]  Sinusitis, Adult  Sinusitis is redness, soreness, and inflammation of the paranasal sinuses. Paranasal sinuses are air pockets within the bones of your face. They are located beneath your eyes, in the middle of your forehead, and above your eyes. In healthy paranasal sinuses, mucus is able to drain out, and air is able to circulate through them by way of your nose. However, when your paranasal sinuses are inflamed, mucus and air can become trapped. This can allow bacteria and other germs to grow and cause infection.  Sinusitis can develop quickly and last only a short time (acute) or continue over a long period (chronic). Sinusitis that lasts for more than 12 weeks is considered chronic.  CAUSES  Causes of sinusitis include:   Allergies.   Structural abnormalities, such as displacement of the cartilage that separates your nostrils (deviated septum), which can decrease the air flow through your nose and sinuses and affect sinus drainage.   Functional abnormalities, such as when the small hairs (cilia) that   line your sinuses and help remove mucus do not work properly or are not present.  SIGNS AND SYMPTOMS  Symptoms of acute and chronic sinusitis are the same. The primary symptoms are pain and pressure around the affected sinuses. Other symptoms include:   Upper toothache.   Earache.   Headache.   Bad breath.   Decreased sense of smell and taste.   A cough, which worsens when you are lying flat.   Fatigue.   Fever.   Thick drainage from your nose, which often is green and may contain pus (purulent).   Swelling and warmth over the affected sinuses.  DIAGNOSIS  Your health care provider will  perform a physical exam. During your exam, your health care provider may perform any of the following to help determine if you have acute sinusitis or chronic sinusitis:   Look in your nose for signs of abnormal growths in your nostrils (nasal polyps).   Tap over the affected sinus to check for signs of infection.   View the inside of your sinuses using an imaging device that has a light attached (endoscope).  If your health care provider suspects that you have chronic sinusitis, one or more of the following tests may be recommended:   Allergy tests.   Nasal culture. A sample of mucus is taken from your nose, sent to a lab, and screened for bacteria.   Nasal cytology. A sample of mucus is taken from your nose and examined by your health care provider to determine if your sinusitis is related to an allergy.  TREATMENT  Most cases of acute sinusitis are related to a viral infection and will resolve on their own within 10 days. Sometimes, medicines are prescribed to help relieve symptoms of both acute and chronic sinusitis. These may include pain medicines, decongestants, nasal steroid sprays, or saline sprays.  However, for sinusitis related to a bacterial infection, your health care provider will prescribe antibiotic medicines. These are medicines that will help kill the bacteria causing the infection.  Rarely, sinusitis is caused by a fungal infection. In these cases, your health care provider will prescribe antifungal medicine.  For some cases of chronic sinusitis, surgery is needed. Generally, these are cases in which sinusitis recurs more than 3 times per year, despite other treatments.  HOME CARE INSTRUCTIONS   Drink plenty of water. Water helps thin the mucus so your sinuses can drain more easily.   Use a humidifier.   Inhale steam 3-4 times a day (for example, sit in the bathroom with the shower running).   Apply a warm, moist washcloth to your face 3-4 times a day, or as directed by your health care  provider.   Use saline nasal sprays to help moisten and clean your sinuses.   Take medicines only as directed by your health care provider.   If you were prescribed either an antibiotic or antifungal medicine, finish it all even if you start to feel better.  SEEK IMMEDIATE MEDICAL CARE IF:   You have increasing pain or severe headaches.   You have nausea, vomiting, or drowsiness.   You have swelling around your face.   You have vision problems.   You have a stiff neck.   You have difficulty breathing.     This information is not intended to replace advice given to you by your health care provider. Make sure you discuss any questions you have with your health care provider.     Document Released: 12/06/2005 Document Revised:   12/27/2014 Document Reviewed: 12/21/2011  Elsevier Interactive Patient Education 2016 [employer] Inc[employer]

## 2016-03-25 NOTE — Addendum Note (Signed)
Addended by: Jannifer RodneyHAWKS, Jakob Kimberlin A on: 03/25/2016 03:22 PM   Modules accepted: Orders

## 2016-03-25 NOTE — Progress Notes (Signed)
Subjective:    Patient ID: Amber Colon, female    DOB: 03/26/1981, 35 y.o.   MRN: [id number]  Headache  This is a new problem. The current episode started in the past 7 days. The problem occurs constantly. The problem has been unchanged. The pain is located in the frontal region. The pain does not radiate. The pain quality is similar to prior headaches. The quality of the pain is described as aching. The pain is at a severity of 7/10. The pain is moderate. Associated symptoms include coughing, eye pain, eye redness, eye watering and a fever. Pertinent negatives include no anorexia, blurred vision, ear pain, phonophobia, photophobia, sinus pressure, sore throat or tinnitus. Nothing aggravates the symptoms. She has tried nothing for the symptoms. The treatment provided no relief.  Diarrhea  Associated symptoms include coughing, a fever and headaches.      Review of Systems  Constitutional: Positive for fever.  HENT: Negative for ear pain, sinus pressure, sore throat and tinnitus.   Eyes: Positive for pain and redness. Negative for blurred vision and photophobia.  Respiratory: Positive for cough. Negative for shortness of breath.   Cardiovascular: Negative.  Negative for palpitations.  Gastrointestinal: Positive for diarrhea. Negative for anorexia.  Endocrine: Negative.   Genitourinary: Negative.   Musculoskeletal: Negative.   Neurological: Positive for headaches.  Hematological: Negative.   Psychiatric/Behavioral: Negative.   All other systems reviewed and are negative.      Objective:   Physical Exam  Constitutional: She is oriented to person, place, and time. She appears well-developed and well-nourished. No distress.  HENT:  Head: Normocephalic and atraumatic.  Right Ear: External ear normal.  Left Ear: There is drainage and swelling.  Nose: Right sinus exhibits maxillary sinus tenderness and frontal sinus tenderness. Left sinus exhibits maxillary sinus tenderness and  frontal sinus tenderness.  Mouth/Throat: Oropharynx is clear and moist.  Nasal passage erythemas with mild swelling    Eyes: Pupils are equal, round, and reactive to light.  Neck: Normal range of motion. Neck supple. No thyromegaly present.  Cardiovascular: Normal rate, regular rhythm, normal heart sounds and intact distal pulses.   No murmur heard. Pulmonary/Chest: Effort normal and breath sounds normal. No respiratory distress. She has no wheezes.  Abdominal: Soft. Bowel sounds are normal. She exhibits no distension. There is no tenderness.  Musculoskeletal: Normal range of motion. She exhibits no edema or tenderness.  Neurological: She is alert and oriented to person, place, and time. She has normal reflexes. No cranial nerve deficit.  Skin: Skin is warm and dry.  Psychiatric: She has a normal mood and affect. Her behavior is normal. Judgment and thought content normal.  Vitals reviewed.     BP 130/85 mmHg  Pulse 103  Temp(Src) 98 F (36.7 C) (Oral)  Ht  (1.651 m)  Wt 352 lb 6.4 oz (159.848 kg)  BMI 58.64 kg/m2     Assessment & Plan:  1. Flu-like symptoms - Veritor Flu A/B Waived  2. Acute maxillary sinusitis, recurrence not specified -- Take meds as prescribed - Use a cool mist humidifier  -Use saline nose sprays frequently -Saline irrigations of the nose can be very helpful if done frequently.  * 4X daily for 1 week*  * Use of a nettie pot can be helpful with this. Follow directions with this* -Force fluids -For any cough or congestion  Use plain Mucinex- regular strength or max strength is fine   * Children- consult with Pharmacist for dosing -For fever  or aces or pains- take tylenol or ibuprofen appropriate for age and weight.  * for fevers greater than 101 orally you may alternate ibuprofen and tylenol every  3 hours. -Throat lozenges if help - fluticasone (FLONASE) 50 MCG/ACT nasal spray; Place 2 sprays into both nostrils daily.  Dispense: 16 g; Refill:  6 - doxycycline (VIBRA-TABS) 100 MG tablet; Take 1 tablet (100 mg total) by mouth 2 (two) times daily.  Dispense: 20 tablet; Refill: 0  3. Recurrent acute otitis media of left ear, unspecified otitis media type -Keep clean and dry -Keep ENT follow up - fluticasone (FLONASE) 50 MCG/ACT nasal spray; Place 2 sprays into both nostrils daily.  Dispense: 16 g; Refill: 6 - doxycycline (VIBRA-TABS) 100 MG tablet; Take 1 tablet (100 mg total) by mouth 2 (two) times daily.  Dispense: 20 tablet; Refill: 0  Amber Rodneyhristy Shakeena Kafer, FNP

## 2016-03-30 ENCOUNTER — Ambulatory Visit: Payer: Medicaid Other | Admitting: Family

## 2016-03-31 ENCOUNTER — Encounter: Payer: Self-pay | Admitting: Family

## 2016-04-01 ENCOUNTER — Ambulatory Visit (INDEPENDENT_AMBULATORY_CARE_PROVIDER_SITE_OTHER): Payer: Medicaid Other | Admitting: Otolaryngology

## 2016-04-01 DIAGNOSIS — H900 Conductive hearing loss, bilateral: Secondary | ICD-10-CM

## 2016-04-01 DIAGNOSIS — H95122 Granulation of postmastoidectomy cavity, left ear: Secondary | ICD-10-CM

## 2016-04-01 DIAGNOSIS — H7101 Cholesteatoma of attic, right ear: Secondary | ICD-10-CM | POA: Diagnosis not present

## 2016-04-05 ENCOUNTER — Other Ambulatory Visit: Payer: Self-pay | Admitting: Otolaryngology

## 2016-05-25 ENCOUNTER — Inpatient Hospital Stay (HOSPITAL_COMMUNITY)
Admission: RE | Admit: 2016-05-25 | Discharge: 2016-05-25 | Disposition: A | Discharge status: Home / Self Care | Payer: Medicaid Other | Source: Ambulatory Visit

## 2016-05-25 NOTE — Pre-Procedure Instructions (Signed)
    Teshia L Timson  05/25/2016      [facility] [address]- [place], [place] - [address] [place] [id number] Phone: [phone] Fax: [phone]    Your procedure is scheduled on Wednesday  June 02, 2016 at 0830.Marland Kitchen.  [facility] Admitting at [employer] A.M.  Call this number if you have problems the morning of surgery:  [phone]   Remember:  Do not eat food or drink liquids after midnight.  Take these medicines the morning of surgery with A SIP OF WATER : Tylenol as needed,  Bentyl (dicyclomine), Synthroid(levothyroxine, and Prilosec(omeprazole)  Do not take any aspirin containing products, goody powders, BC's or NSAIDS such as Aleve, ibuprofen advil or motril , vitamin or herbal supplements for 1 week prior to surgery.   Do not wear jewelry, make-up or nail polish.  Do not wear lotions, powders, or perfumes.  You may wear deodorant.  Do not shave 48 hours prior to surgery.  Men may shave face and neck.  Do not bring valuables to the hospital.  [facility] is not responsible for any belongings or valuables.  Contacts, dentures or bridgework may not be worn into surgery.  Leave your suitcase in the car.  After surgery it may be brought to your room.  For patients admitted to the hospital, discharge time will be determined by your treatment team.  Patients discharged the day of surgery will not be allowed to drive home.   Special instructions:    Please read over the following fact sheets that you were given. Pain Booklet, Coughing and Deep Breathing and Surgical Site Infection Prevention

## 2016-06-29 ENCOUNTER — Inpatient Hospital Stay (HOSPITAL_COMMUNITY): Admission: RE | Admit: 2016-06-29 | Payer: Medicaid Other | Source: Ambulatory Visit

## 2016-06-30 ENCOUNTER — Encounter: Payer: Self-pay | Admitting: Pediatrics

## 2016-06-30 ENCOUNTER — Ambulatory Visit (INDEPENDENT_AMBULATORY_CARE_PROVIDER_SITE_OTHER): Payer: Medicaid Other | Admitting: Pediatrics

## 2016-06-30 VITALS — BP 97/72 | HR 80 | Temp 97.2°F | Ht 65.0 in | Wt 346.8 lb

## 2016-06-30 DIAGNOSIS — R42 Dizziness and giddiness: Secondary | ICD-10-CM | POA: Diagnosis not present

## 2016-06-30 DIAGNOSIS — F329 Major depressive disorder, single episode, unspecified: Secondary | ICD-10-CM | POA: Diagnosis not present

## 2016-06-30 DIAGNOSIS — I10 Essential (primary) hypertension: Secondary | ICD-10-CM

## 2016-06-30 DIAGNOSIS — F32A Depression, unspecified: Secondary | ICD-10-CM

## 2016-06-30 DIAGNOSIS — R197 Diarrhea, unspecified: Secondary | ICD-10-CM | POA: Diagnosis not present

## 2016-06-30 MED ORDER — HYDROCHLOROTHIAZIDE 25 MG PO TABS: 25.0000 mg | ORAL_TABLET | Freq: Every day | ORAL | Status: DC

## 2016-06-30 MED ORDER — CITALOPRAM HYDROBROMIDE 20 MG PO TABS: 20.0000 mg | ORAL_TABLET | Freq: Every day | ORAL | Status: DC

## 2016-06-30 NOTE — Progress Notes (Signed)
Subjective:    Patient ID: Amber Colon, female    DOB: 03/26/1981, 35 y.o.   MRN: [id number]  CC: Diarrhea; Abdominal Pain; Nausea; and Dizziness   HPI: Amber Colon is a 35 y.o. female presenting for Diarrhea; Abdominal Pain; Nausea; and Dizziness  Loose stools started yesterday, went 4-5 times yesterday No blood in stool Was green Has h/o IBS Has a week of constipation followed by week of diarrhea Has some chills at night, which is unusual for IBS Feeling dizzy and lightheaded Had surg on L ear for cholesteatoma in Jan, has upcoming R sided surgery scheduled Has BP cuff at home but not recently using it  Stress at home with husband in [place], trying to get him home 3 kids at home Has had ongoing problems off and on with depression Was briefly on lexapro 10mg , didn't think it helped so stopped it. Dose was increased but pt didn't take it A couple of times has had thoughts of not wanting to be here in the last few months due to all of the stress Says she would never follow through, no plan Cares too much for her kids and they need her  Takes BP meds daily, on lisinopril 40mg , HCTZ 25mg    Depression screen [facility] 2/9 06/30/2016 01/22/2016 06/18/2015  Decreased Interest 3 0 0  Down, Depressed, Hopeless 3 0 0  PHQ - 2 Score 6 0 0  Altered sleeping 3 - -  Tired, decreased energy 3 - -  Change in appetite 2 - -  Feeling bad or failure about yourself  3 - -  Trouble concentrating 2 - -  Moving slowly or fidgety/restless 1 - -  Suicidal thoughts 1 - -  PHQ-9 Score 21 - -  Difficult doing work/chores Somewhat difficult - -     Relevant past medical, surgical, family and social history reviewed and updated as indicated.  Interim medical history since our last visit reviewed. Allergies and medications reviewed and updated.  ROS: Per HPI unless specifically indicated above  History  Smoking status  . Current Some Day Smoker -- 0.25 packs/day for 2 years  Smokeless  tobacco  . Not on file       Objective:    BP 97/72 mmHg  Pulse 80  Temp(Src) 97.2 F (36.2 C) (Oral)  Ht 5\' 5"  (1.651 m)  Wt 346 lb 12.8 oz (157.307 kg)  BMI 57.71 kg/m2  LMP 06/23/2016 (Approximate)  Wt Readings from Last 3 Encounters:  06/30/16 346 lb 12.8 oz (157.307 kg)  03/25/16 352 lb 6.4 oz (159.848 kg)  03/15/16 355 lb 3.2 oz (161.118 kg)     Gen: NAD, alert, cooperative with exam, NCAT EYES: EOMI, no scleral injection or icterus CV: NRRR, normal S1/S2, no murmur Resp: CTABL, no wheezes, normal WOB Abd: +BS, soft, NT. no guarding or organomegaly Ext: No pitting edema, warm Neuro: Alert and oriented, strength equal b/l UE and LE, coordination grossly normal MSK: normal muscle bulk Psych: normal affect, tearful at times     Assessment & Plan:    Amber Colon was seen today for diarrhea, fever, abdominal pain, nausea and dizziness.  Diagnoses and all orders for this visit:  Essential hypertension Low BP today Low BPs at home likely contributing to symptoms Will stop lisinopril Check BP at home, restart HCTZ when SBP over 120-130 in the morning -     hydrochlorothiazide (HYDRODIURIL) 25 MG tablet; Take 1 tablet (25 mg total) by mouth daily.  Depression Feels  safe at home Gave list of therapists in area Start below -     citalopram (CELEXA) 20 MG tablet; Take 1 tablet (20 mg total) by mouth daily.  Lightheadedness Will decrease BP meds  Frequent loose stools H/o IBS, this is similar to IBS symptoms, but also with lightheadedness, fatigue, weakness that may be coming from being on too much BP med for now. Will decrease meds, reassess.  Other orders      Follow up plan: Return in about 4 weeks (around 07/28/2016).  Rex Kras, MD [facility] 06/30/2016, 2:46 PM

## 2016-06-30 NOTE — Patient Instructions (Signed)
Let me know if BP regularly >140 on top or >90 on bottom

## 2016-07-02 ENCOUNTER — Encounter (HOSPITAL_COMMUNITY)
Admission: RE | Admit: 2016-07-02 | Discharge: 2016-07-02 | Disposition: A | Discharge status: Home / Self Care | Payer: Medicaid Other | Source: Ambulatory Visit | Attending: Otolaryngology | Admitting: Otolaryngology

## 2016-07-02 ENCOUNTER — Other Ambulatory Visit (HOSPITAL_COMMUNITY): Payer: Self-pay | Admitting: *Deleted

## 2016-07-02 ENCOUNTER — Encounter (HOSPITAL_COMMUNITY): Payer: Self-pay

## 2016-07-02 DIAGNOSIS — Z01812 Encounter for preprocedural laboratory examination: Secondary | ICD-10-CM | POA: Insufficient documentation

## 2016-07-02 HISTORY — DX: Anxiety disorder, unspecified: F41.9

## 2016-07-02 HISTORY — DX: Depression, unspecified: F32.A

## 2016-07-02 HISTORY — DX: Major depressive disorder, single episode, unspecified: F32.9

## 2016-07-02 LAB — CBC
HEMATOCRIT: 36.2 % (ref 36.0–46.0)
Hemoglobin: 11.9 g/dL — ABNORMAL LOW (ref 12.0–15.0)
MCH: 28.3 pg (ref 26.0–34.0)
MCHC: 32.9 g/dL (ref 30.0–36.0)
MCV: 86.2 fL (ref 78.0–100.0)
Platelets: 245 10*3/uL (ref 150–400)
RBC: 4.2 MIL/uL (ref 3.87–5.11)
RDW: 14.7 % (ref 11.5–15.5)
WBC: 7.4 10*3/uL (ref 4.0–10.5)

## 2016-07-02 LAB — BASIC METABOLIC PANEL
Anion gap: 7 (ref 5–15)
BUN: 10 mg/dL (ref 6–20)
CALCIUM: 9.7 mg/dL (ref 8.9–10.3)
CHLORIDE: 103 mmol/L (ref 101–111)
CO2: 26 mmol/L (ref 22–32)
CREATININE: 1.15 mg/dL — AB (ref 0.44–1.00)
Glucose, Bld: 104 mg/dL — ABNORMAL HIGH (ref 65–99)
Potassium: 3.9 mmol/L (ref 3.5–5.1)
SODIUM: 136 mmol/L (ref 135–145)

## 2016-07-02 LAB — HCG, SERUM, QUALITATIVE: PREG SERUM: NEGATIVE

## 2016-07-02 NOTE — Pre-Procedure Instructions (Signed)
    Amber Colon  07/02/2016      [facility] [address] - [place], [place] - [address] [place] [id number] Phone: [phone] Fax: [phone]    Your procedure is scheduled on 07-07-2016    Wednesday   [facility] Admitting at 6:30A.M.   Call this number if you have problems the morning of surgery:  [phone]   Remember:  Do not eat food or drink liquids after midnight.   Take these medicines the morning of surgery with A SIP OF WATER Tylenol if needed,citalopram(Celexa),Dicyclomine(Bentyl),Levothyroxine(Synthroid),omeprazole(Prilosec)               STOP ASPIRIN,ANTIINFLAMATORIES (IBUPROFEN,ALEVE,MOTRIN,ADVIL,GOODY'S POWDERS),HERBAL SUPPLEMENTS,FISH OIL,AND VITAMINS 5-7 DAYS PRIOR TO SURGERY   Do not wear jewelry, make-up or nail polish.  Do not wear lotions, powders, or perfumes.  You may  NOT wear deoderant.  Do not shave 48 hours prior to surgery.    Do not bring valuables to the hospital.  [facility] is not responsible for any belongings or valuables.  Contacts, dentures or bridgework may not be worn into surgery.  Leave your suitcase in the car.  After surgery it may be brought to your room.  For patients admitted to the hospital, discharge time will be determined by your treatment team.  Patients discharged the day of surgery will not be allowed to drive home.    Special instructions:  See Attached Instruction Sheet  On [employer]  Please read over the following fact sheets that you were given. MRSA Information and Surgical Site Infection Prevention

## 2016-07-07 ENCOUNTER — Encounter (HOSPITAL_COMMUNITY): Payer: Self-pay | Admitting: Anesthesiology

## 2016-07-07 ENCOUNTER — Ambulatory Visit (HOSPITAL_COMMUNITY): Payer: Medicaid Other | Admitting: Anesthesiology

## 2016-07-07 ENCOUNTER — Ambulatory Visit (HOSPITAL_COMMUNITY)
Admission: RE | Admit: 2016-07-07 | Discharge: 2016-07-07 | Disposition: A | Discharge status: Home / Self Care | Payer: Medicaid Other | Source: Ambulatory Visit | Attending: Otolaryngology | Admitting: Otolaryngology

## 2016-07-07 ENCOUNTER — Encounter (HOSPITAL_COMMUNITY)
Admission: RE | Disposition: A | Discharge status: Home / Self Care | Payer: Self-pay | Source: Ambulatory Visit | Attending: Otolaryngology

## 2016-07-07 DIAGNOSIS — K219 Gastro-esophageal reflux disease without esophagitis: Secondary | ICD-10-CM | POA: Diagnosis not present

## 2016-07-07 DIAGNOSIS — F418 Other specified anxiety disorders: Secondary | ICD-10-CM | POA: Insufficient documentation

## 2016-07-07 DIAGNOSIS — I10 Essential (primary) hypertension: Secondary | ICD-10-CM | POA: Insufficient documentation

## 2016-07-07 DIAGNOSIS — F17200 Nicotine dependence, unspecified, uncomplicated: Secondary | ICD-10-CM | POA: Insufficient documentation

## 2016-07-07 DIAGNOSIS — Z79899 Other long term (current) drug therapy: Secondary | ICD-10-CM | POA: Insufficient documentation

## 2016-07-07 DIAGNOSIS — H7191 Unspecified cholesteatoma, right ear: Secondary | ICD-10-CM | POA: Diagnosis present

## 2016-07-07 DIAGNOSIS — H9011 Conductive hearing loss, unilateral, right ear, with unrestricted hearing on the contralateral side: Secondary | ICD-10-CM | POA: Diagnosis not present

## 2016-07-07 DIAGNOSIS — H7121 Cholesteatoma of mastoid, right ear: Secondary | ICD-10-CM | POA: Diagnosis not present

## 2016-07-07 DIAGNOSIS — D649 Anemia, unspecified: Secondary | ICD-10-CM | POA: Diagnosis not present

## 2016-07-07 DIAGNOSIS — H902 Conductive hearing loss, unspecified: Secondary | ICD-10-CM | POA: Diagnosis not present

## 2016-07-07 DIAGNOSIS — E039 Hypothyroidism, unspecified: Secondary | ICD-10-CM | POA: Insufficient documentation

## 2016-07-07 DIAGNOSIS — Z791 Long term (current) use of non-steroidal anti-inflammatories (NSAID): Secondary | ICD-10-CM | POA: Diagnosis not present

## 2016-07-07 DIAGNOSIS — Z6841 Body Mass Index (BMI) 40.0 and over, adult: Secondary | ICD-10-CM | POA: Diagnosis not present

## 2016-07-07 HISTORY — PX: TYMPANOMASTOIDECTOMY: SHX34

## 2016-07-07 SURGERY — TYMPANOPLASTY, WITH MASTOIDECTOMY
Anesthesia: General | Site: Ear | Laterality: Right

## 2016-07-07 MED ORDER — CLINDAMYCIN HCL 300 MG PO CAPS: 300.0000 mg | ORAL_CAPSULE | Freq: Three times a day (TID) | ORAL | Status: DC

## 2016-07-07 MED ORDER — SUCCINYLCHOLINE CHLORIDE 20 MG/ML IJ SOLN
INTRAMUSCULAR | Status: DC | PRN
  Administered 2016-07-07: 120 mg via INTRAVENOUS

## 2016-07-07 MED ORDER — ONDANSETRON HCL 4 MG/2ML IJ SOLN
INTRAMUSCULAR | Status: DC | PRN
  Administered 2016-07-07: 4 mg via INTRAVENOUS

## 2016-07-07 MED ORDER — SUCCINYLCHOLINE CHLORIDE 200 MG/10ML IV SOSY
PREFILLED_SYRINGE | INTRAVENOUS | Status: AC
  Filled 2016-07-07: qty 10

## 2016-07-07 MED ORDER — PROPOFOL 10 MG/ML IV BOLUS
INTRAVENOUS | Status: DC | PRN
  Administered 2016-07-07: 100 mg via INTRAVENOUS
  Administered 2016-07-07 (×3): 30 mg via INTRAVENOUS
  Administered 2016-07-07: 50 mg via INTRAVENOUS
  Administered 2016-07-07: 200 mg via INTRAVENOUS
  Administered 2016-07-07: 30 mg via INTRAVENOUS

## 2016-07-07 MED ORDER — GLYCOPYRROLATE 0.2 MG/ML IJ SOLN
INTRAMUSCULAR | Status: DC | PRN
  Administered 2016-07-07: 0.2 mg via INTRAVENOUS

## 2016-07-07 MED ORDER — PROPOFOL 10 MG/ML IV BOLUS
INTRAVENOUS | Status: AC
  Filled 2016-07-07: qty 60

## 2016-07-07 MED ORDER — LACTATED RINGERS IV SOLN
INTRAVENOUS | Status: DC | PRN
  Administered 2016-07-07 (×2): via INTRAVENOUS

## 2016-07-07 MED ORDER — OXYCODONE HCL 5 MG/5ML PO SOLN: 5.0000 mg | Freq: Once | ORAL | Status: DC | PRN

## 2016-07-07 MED ORDER — MIDAZOLAM HCL 2 MG/2ML IJ SOLN
INTRAMUSCULAR | Status: AC
  Filled 2016-07-07: qty 2

## 2016-07-07 MED ORDER — LIDOCAINE HCL (CARDIAC) 20 MG/ML IV SOLN
INTRAVENOUS | Status: DC | PRN
  Administered 2016-07-07: 100 mg via INTRAVENOUS

## 2016-07-07 MED ORDER — DEXAMETHASONE SODIUM PHOSPHATE 4 MG/ML IJ SOLN
INTRAMUSCULAR | Status: DC | PRN
  Administered 2016-07-07: 10 mg via INTRAVENOUS

## 2016-07-07 MED ORDER — OXYCODONE HCL 5 MG PO TABS: 5.0000 mg | ORAL_TABLET | Freq: Once | ORAL | Status: DC | PRN

## 2016-07-07 MED ORDER — ESMOLOL HCL 100 MG/10ML IV SOLN
INTRAVENOUS | Status: DC | PRN
  Administered 2016-07-07: 20 mg via INTRAVENOUS

## 2016-07-07 MED ORDER — CEFAZOLIN SODIUM-DEXTROSE 2-4 GM/100ML-% IV SOLN
INTRAVENOUS | Status: AC
  Filled 2016-07-07: qty 100

## 2016-07-07 MED ORDER — LIDOCAINE-EPINEPHRINE 1 %-1:100000 IJ SOLN
INTRAMUSCULAR | Status: AC
  Filled 2016-07-07: qty 1

## 2016-07-07 MED ORDER — CIPROFLOXACIN-DEXAMETHASONE 0.3-0.1 % OT SUSP
OTIC | Status: AC
  Filled 2016-07-07: qty 7.5

## 2016-07-07 MED ORDER — HYDROMORPHONE HCL 1 MG/ML IJ SOLN
0.2500 mg | INTRAMUSCULAR | Status: DC | PRN
  Administered 2016-07-07 (×4): 0.5 mg via INTRAVENOUS

## 2016-07-07 MED ORDER — FENTANYL CITRATE (PF) 250 MCG/5ML IJ SOLN
INTRAMUSCULAR | Status: AC
  Filled 2016-07-07: qty 5

## 2016-07-07 MED ORDER — HYDROMORPHONE HCL 1 MG/ML IJ SOLN
INTRAMUSCULAR | Status: AC
  Administered 2016-07-07: 0.5 mg via INTRAVENOUS
  Filled 2016-07-07: qty 1

## 2016-07-07 MED ORDER — CLINDAMYCIN PHOSPHATE 900 MG/50ML IV SOLN
INTRAVENOUS | Status: AC
Start: 2016-07-07 — End: 2016-07-07
  Administered 2016-07-07: 900 mg via INTRAVENOUS
  Filled 2016-07-07: qty 50

## 2016-07-07 MED ORDER — ONDANSETRON HCL 4 MG/2ML IJ SOLN: 4.0000 mg | Freq: Four times a day (QID) | INTRAMUSCULAR | Status: DC | PRN

## 2016-07-07 MED ORDER — MIDAZOLAM HCL 5 MG/5ML IJ SOLN
INTRAMUSCULAR | Status: DC | PRN
  Administered 2016-07-07: 2 mg via INTRAVENOUS

## 2016-07-07 MED ORDER — DEXAMETHASONE SODIUM PHOSPHATE 10 MG/ML IJ SOLN
INTRAMUSCULAR | Status: AC
  Filled 2016-07-07: qty 1

## 2016-07-07 MED ORDER — EPINEPHRINE 30 MG/30ML IJ SOLN
INTRAMUSCULAR | Status: DC | PRN
  Administered 2016-07-07: 30 mL via TOPICAL

## 2016-07-07 MED ORDER — LIDOCAINE-EPINEPHRINE 1 %-1:100000 IJ SOLN
INTRAMUSCULAR | Status: DC | PRN
  Administered 2016-07-07: 3.5 mL

## 2016-07-07 MED ORDER — SODIUM CHLORIDE 0.9 % IR SOLN
Status: DC | PRN
  Administered 2016-07-07: 1000 mL

## 2016-07-07 MED ORDER — EPINEPHRINE HCL (NASAL) 0.1 % NA SOLN
NASAL | Status: AC
  Filled 2016-07-07: qty 30

## 2016-07-07 MED ORDER — 0.9 % SODIUM CHLORIDE (POUR BTL) OPTIME
TOPICAL | Status: DC | PRN
  Administered 2016-07-07: 1000 mL

## 2016-07-07 MED ORDER — CEFAZOLIN IN D5W 1 GM/50ML IV SOLN
INTRAVENOUS | Status: AC
  Filled 2016-07-07: qty 50

## 2016-07-07 MED ORDER — LIDOCAINE 2% (20 MG/ML) 5 ML SYRINGE
INTRAMUSCULAR | Status: AC
  Filled 2016-07-07: qty 5

## 2016-07-07 MED ORDER — FENTANYL CITRATE (PF) 100 MCG/2ML IJ SOLN
INTRAMUSCULAR | Status: DC | PRN
  Administered 2016-07-07 (×4): 50 ug via INTRAVENOUS

## 2016-07-07 MED ORDER — CIPROFLOXACIN-DEXAMETHASONE 0.3-0.1 % OT SUSP
OTIC | Status: DC | PRN
  Administered 2016-07-07: 4 [drp] via OTIC

## 2016-07-07 MED ORDER — OXYCODONE-ACETAMINOPHEN 5-325 MG PO TABS: 1.0000 | ORAL_TABLET | ORAL | Status: DC | PRN

## 2016-07-07 MED ORDER — HEMOSTATIC AGENTS (NO CHARGE) OPTIME
TOPICAL | Status: DC | PRN
  Administered 2016-07-07: 1 via TOPICAL

## 2016-07-07 MED ORDER — ONDANSETRON HCL 4 MG/2ML IJ SOLN
INTRAMUSCULAR | Status: AC
  Filled 2016-07-07: qty 2

## 2016-07-07 MED FILL — Epinephrine HCl Nasal Soln 0.1%: NASAL | Qty: 30 | Status: AC

## 2016-07-07 SURGICAL SUPPLY — 67 items
ADH SKN CLS APL DERMABOND .7 (GAUZE/BANDAGES/DRESSINGS) ×1
BALL CTTN LRG ABS STRL LF (GAUZE/BANDAGES/DRESSINGS) ×1
BLADE 10 SAFETY STRL DISP (BLADE) ×3 IMPLANT
BLADE SURG 10 STRL SS (BLADE) IMPLANT
BLADE SURG 15 STRL LF DISP TIS (BLADE) IMPLANT
BLADE SURG 15 STRL SS (BLADE)
BLADE SURG ROTATE 9660 (MISCELLANEOUS) ×3 IMPLANT
BUR DIAMOND COARSE 3.0 (BURR) ×2 IMPLANT
BUR RND OSTEON ELITE 6.0 (BURR) ×1 IMPLANT
BUR RND OSTEON ELITE 6.0MM (BURR) ×1
CANISTER SUCTION 2500CC (MISCELLANEOUS) ×3 IMPLANT
CONT SPEC 4OZ CLIKSEAL STRL BL (MISCELLANEOUS) ×2 IMPLANT
CORDS BIPOLAR (ELECTRODE) ×2 IMPLANT
COTTONBALL LRG STERILE PKG (GAUZE/BANDAGES/DRESSINGS) ×3 IMPLANT
COVER SURGICAL LIGHT HANDLE (MISCELLANEOUS) ×3 IMPLANT
DERMABOND ADVANCED (GAUZE/BANDAGES/DRESSINGS) ×2
DERMABOND ADVANCED .7 DNX12 (GAUZE/BANDAGES/DRESSINGS) IMPLANT
DRAPE MICROSCOPE LEICA 54X105 (DRAPE) ×3 IMPLANT
DRAPE NEUROLOGICAL W/INCISE (DRAPES) ×3 IMPLANT
DRAPE PROXIMA HALF (DRAPES) IMPLANT
DRAPE SURG 17X23 STRL (DRAPES) ×3 IMPLANT
DRESSING NASAL POPE 10X1.5X2.5 (GAUZE/BANDAGES/DRESSINGS) IMPLANT
DRSG GLASSCOCK MASTOID ADT (GAUZE/BANDAGES/DRESSINGS) ×2 IMPLANT
DRSG GLASSCOCK MASTOID PED (GAUZE/BANDAGES/DRESSINGS) IMPLANT
DRSG NASAL POPE 10X1.5X2.5 (GAUZE/BANDAGES/DRESSINGS) ×3
ELECT COATED BLADE 2.86 ST (ELECTRODE) ×3 IMPLANT
ELECT PAIRED SUBDERMAL (MISCELLANEOUS)
ELECT REM PT RETURN 9FT ADLT (ELECTROSURGICAL) ×3
ELECTRODE PAIRED SUBDERMAL (MISCELLANEOUS) IMPLANT
ELECTRODE REM PT RTRN 9FT ADLT (ELECTROSURGICAL) ×1 IMPLANT
GAUZE SPONGE 4X4 12PLY STRL (GAUZE/BANDAGES/DRESSINGS) ×3 IMPLANT
GAUZE SPONGE 4X4 16PLY XRAY LF (GAUZE/BANDAGES/DRESSINGS) ×2 IMPLANT
GLOVE BIOGEL PI IND STRL 7.0 (GLOVE) IMPLANT
GLOVE BIOGEL PI INDICATOR 7.0 (GLOVE) ×2
GLOVE ECLIPSE 7.5 STRL STRAW (GLOVE) ×3 IMPLANT
GLOVE SURG SS PI 7.0 STRL IVOR (GLOVE) ×4 IMPLANT
GOWN STRL REUS W/ TWL LRG LVL3 (GOWN DISPOSABLE) ×2 IMPLANT
GOWN STRL REUS W/TWL LRG LVL3 (GOWN DISPOSABLE) ×6
HEMOSTAT SURGICEL 2X14 (HEMOSTASIS) ×1 IMPLANT
KIT BASIN OR (CUSTOM PROCEDURE TRAY) ×3 IMPLANT
KIT ROOM TURNOVER OR (KITS) ×3 IMPLANT
NDL 18GX1X1/2 (RX/OR ONLY) (NEEDLE) IMPLANT
NDL HYPO 25GX1X1/2 BEV (NEEDLE) IMPLANT
NEEDLE 18GX1X1/2 (RX/OR ONLY) (NEEDLE) IMPLANT
NEEDLE HYPO 25GX1X1/2 BEV (NEEDLE) ×3 IMPLANT
NS IRRIG 1000ML POUR BTL (IV SOLUTION) ×3 IMPLANT
PAD ARMBOARD 7.5X6 YLW CONV (MISCELLANEOUS) ×6 IMPLANT
PATTIES SURGICAL .5 X.5 (GAUZE/BANDAGES/DRESSINGS) IMPLANT
PENCIL BUTTON HOLSTER BLD 10FT (ELECTRODE) ×3 IMPLANT
PUNCH BIOPSY (OTIC EAR SUPPLIES) IMPLANT
SOL PREP POV-IOD 4OZ 10% (MISCELLANEOUS) ×2 IMPLANT
SPECIMEN JAR SMALL (MISCELLANEOUS) IMPLANT
SPONGE SURGIFOAM ABS GEL 12-7 (HEMOSTASIS) ×3 IMPLANT
SUT BONE WAX W31G (SUTURE) IMPLANT
SUT PLAIN 5 0 P 3 18 (SUTURE) IMPLANT
SUT VIC AB 4-0 SH 27 (SUTURE) ×3
SUT VIC AB 4-0 SH 27XBRD (SUTURE) IMPLANT
SUT VICRYL RAPIDE 4/0 PS 2 (SUTURE) ×1 IMPLANT
SYR 3ML LL SCALE MARK (SYRINGE) ×3 IMPLANT
SYR TB 1ML LUER SLIP (SYRINGE) IMPLANT
TOWEL OR 17X24 6PK STRL BLUE (TOWEL DISPOSABLE) ×3 IMPLANT
TRAY ENT MC OR (CUSTOM PROCEDURE TRAY) ×3 IMPLANT
TRAY FOLEY CATH 14FRSI W/METER (CATHETERS) IMPLANT
TUBING EXTENTION W/L.L. (IV SETS) ×3 IMPLANT
TUBING IRRIGATION (MISCELLANEOUS) ×3 IMPLANT
VENT IRR SPI W TUB AD (MISCELLANEOUS) IMPLANT
WIPE INSTRUMENT VISIWIPE 73X73 (MISCELLANEOUS) ×3 IMPLANT

## 2016-07-07 NOTE — Transfer of Care (Signed)
Immediate Anesthesia Transfer of Care Note  Patient: Amber Colon  Procedure(s) Performed: Procedure(s): RIGHT TYMPANOMASTOIDECTOMY (Right)  Patient Location: PACU  Anesthesia Type:General  Level of Consciousness: awake and alert   Airway & Oxygen Therapy: Patient Spontanous Breathing and Patient connected to face mask oxygen  Post-op Assessment: Report given to RN and Post -op Vital signs reviewed and stable  Post vital signs: Reviewed and stable  Last Vitals:  Filed Vitals:   07/07/16 0732  BP: 122/82  Pulse: 71  Temp: 36.5 C  Resp: 20    Last Pain:  Filed Vitals:   07/07/16 0753  PainSc: 3          Complications: No apparent anesthesia complications

## 2016-07-07 NOTE — Op Note (Signed)
DATE OF PROCEDURE: 07/07/2016  OPERATIVE REPORT   SURGEON: Newman PiesSu Jaqwon Manfred, MD  PREOPERATIVE DIAGNOSES: 1. Right ear cholesteatoma 2. Right ear conductive hearing loss  POSTOPERATIVE DIAGNOSES: 1. Right ear cholesteatoma 2. Right ear conductive hearing loss 3. Partial erosion of right malleus and incus  PROCEDURE PERFORMED:  Right canal wall down modified radical tympanomastoidectomy   ANESTHESIA:  General endotracheal tube anesthesia.  COMPLICATIONS:  None.  ESTIMATED BLOOD LOSS:  100ml  INDICATION FOR PROCEDURE:  Amber Colon is a 35 y.o. female with a history of bilateral cholesteatoma. She previously underwent left tympanomastoidectomy surgery to treat left ear cholesteatoma. At her last visit, she was noted to have a right attic perforation with middle ear cholesteatoma. She was noted to have right ear conductive hearing loss. 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 Based on the above findings, the decision was made for patient to undergo the above-stated procedure. The risks, benefits, alternatives, and details of the procedure were discussed with the patient.  Questions were invited and answered.  Informed consent was obtained.  DESCRIPTION:  The patient was taken to the operating room and placed supine on the operating table.  General endotracheal tube anesthesia was administered by the anesthesiologist.  The patient was positioned and prepped and draped in a standard fashion for right ear surgery. Facial nerve monitoring electrodes were placed. The facial nerve monitoring system was functional throughout the case.  Under the operating microscope, the right ear canal was cleaned of all cerumen. An attic perforation was noted. Cholesteatoma was noted within the right epitympanum. A standard tympanomeatal flap was elevated in a standard fashion. A large amount of cholesteatoma was noted within the epitympanum. The head of her malleus and incus  were partially eroded. The stapes superstructure was intact. Cholesteatoma was also noted around the stapes. The cholesteatoma was removed. The remaining malleus and incus were also removed.   Due to her severely hypoplastic mastoid cavity, the decision was made for patient to undergo a canal wall down mastoidectomy procedure.  A standard postauricular incision was made. The soft tissue overlying the mastoid cortex was elevated. A standard cortical mastoidectomy was performed. It should be noted that the patient's mastoid bone was severely hypoplastic. No air cell was noted. The entire mastoid bone was a small solid block. The sigmoid sinus was approximately 6 mm posterior to the ear canal. A canal wall down radical mastoidectomy procedure was then performed. All remaining cholesteatoma was removed. A piece of temporalis fascia was also harvested. The fascia graft was used to reconstruct a neotympanum overlying the stapes superstructure.  Attention was then focused on performing the meatoplasty.  12:00 and 6:00 incisions were made. The posterior conchal cartilage was removed. The meatal flap was sutured to the posterior mastoid soft tissue. A large otowick was placed.   The care of the patient was turned over to the anesthesiologist.  The patient was awakened from anesthesia without difficulty.  She was extubated and transferred to the recovery room in good condition.  OPERATIVE FINDINGS: Right ear cholesteatoma, with partial erosion of the patient's malleus, and incus. 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  SPECIMEN: Right ear cholesteatoma.   FOLLOWUP CARE:  The patient will be discharged home once awake and alert. The patient will follow up in my office in approximately 1 week.

## 2016-07-07 NOTE — Discharge Instructions (Signed)
POSTOPERATIVE INSTRUCTIONS FOR PATIENTS HAVING MASTOIDECTOMY SURGERY ° °1. You may have nausea, vomiting, or a low grade fever for a few days after surgery. This is not unusual.  However, if the nausea and vomiting become severe or last more than one day, please call our office. Medication for nausea may be prescribed. You may take Tylenol every four hours for fever. If your fever should rise above 101 F, please contact our office. °2. Limit your activities for one week. This includes avoiding heavy lifting (over 20lbs), vigorous exercise, and contact sports. °3. Do not blow your nose for approximately one week.  Any accumulation in the nose should be drawn back into the throat and expectorated through the mouth to avoid infecting the ear. If it is necessary to sneeze, do so with your mouth open to decrease pressure to your ears. Do not hold your nose to avoid sneezing.  °4. You may wash your hair 2 days after the operation. Please protect the ear and any external incision from water. We recommend placing some plastic wrap over the ear and incision to help protect against water. It may be necessary to have someone help you during the first several washings.  °5. Try to keep the incision clean and dry. You should clean crust from the incisional area with diluted hydrogen peroxide. Any time you are going to clean your ear, please wash your hands thoroughly prior to starting. °6. Some dull postoperative ear pain is expected. Your physician may prescribe pain medicine to help relieve your discomfort. If your postoperative pain increases and your medication is not helping, please call the office before taking any other medication that we have not prescribed or recommended. °7. If any of the following should occur, contact Dr.Jeylin Woodmansee:   (Office: [phone])) °a. Persistent bleeding                                                             °b. Persistent fever °c. Purulent drainage (pus) from the ear or  incision °d. Increasing redness around the suture line °e. Persistent pain or dizziness °f. Facial weakness °8. Sometimes, with a larger incision behind the ear, the incision may open and drain. If it occurs, please contact our office. °9. If your physician prescribes an antibiotic, fill the prescription promptly and take all of the medicine as directed until the entire supply is gone. °10.  You may experience some popping and cracking sounds in the ear for up to several weeks. It may sound like you are “talking in a barrel” or a tunnel. This is normal and should not cause concern. °11. Because a nerve for taste passes through your ear, it is not unusual for your taste sensation to be altered for several weeks or months. °12. You may experience some numbness in your outer ear, earlobe, and the incision area. This is normal, and most of the numbness will be expected to fade over a period of time.                                                               °  13. Your eardrum may look “pink” or “red” for up to a month postoperatively. The red coloration is due to fluid in the middle ear. The change in color should not be confused with infection.  °14. It is important to return to our office for your postoperative appointment as scheduled. If for some reason you were not given a postoperative appointment, please call our office at [phone]. °

## 2016-07-07 NOTE — Anesthesia Preprocedure Evaluation (Addendum)
Anesthesia Evaluation  Patient identified by MRN, date of birth, ID band Patient awake    Reviewed: Allergy & Precautions, NPO status , Patient's Chart, lab work & pertinent test results  History of Anesthesia Complications Negative for: history of anesthetic complications  Airway Mallampati: II   Neck ROM: full    Dental no notable dental hx. (+) Teeth Intact, Dental Advisory Given   Pulmonary Current Smoker,    breath sounds clear to auscultation       Cardiovascular hypertension,  Rhythm:regular Rate:Normal     Neuro/Psych Anxiety Depression    GI/Hepatic GERD  ,  Endo/Other  Hypothyroidism Morbid obesity  Renal/GU      Musculoskeletal   Abdominal   Peds  Hematology  (+) anemia ,   Anesthesia Other Findings   Reproductive/Obstetrics                            Anesthesia Physical Anesthesia Plan  ASA: II  Anesthesia Plan: General   Post-op Pain Management:    Induction: Intravenous  Airway Management Planned: Oral ETT  Additional Equipment:   Intra-op Plan:   Post-operative Plan: Extubation in OR  Informed Consent: I have reviewed the patients History and Physical, chart, labs and discussed the procedure including the risks, benefits and alternatives for the proposed anesthesia with the patient or authorized representative who has indicated his/her understanding and acceptance.   Dental advisory given  Plan Discussed with: CRNA, Anesthesiologist and Surgeon  Anesthesia Plan Comments:        Anesthesia Quick Evaluation

## 2016-07-07 NOTE — H&P (Signed)
Cc: Right ear cholesteatoma  HPI: The patient is a 35 year old female who returns today for her follow-up evaluation.  The patient was previously noted to have bilateral cholesteatoma, significantly worse on the left side.  She underwent left canal wall down tympanomastoidectomy surgery 3 months ago. According to the patient, she has not noted any drainage from her ears.  She was recently noted to have a large amount of crusting within her left mastoid cavity.  She denies any significant otalgia or otorrhea.  She continues to have some hearing difficulty, especially on the left side.  She has no other complaint today. No other ENT, GI, or respiratory issue noted since the last visit.   Exam: General: Communicates without difficulty, well nourished, no acute distress. Head: Normocephalic, no evidence injury, no tenderness, facial buttresses intact without stepoff. Eyes: PERRL, EOMI. No scleral icterus, conjunctivae clear. Neuro: CN II exam reveals vision grossly intact. No nystagmus at any point of gaze. Ears: Auricles well formed without lesions.  A large amount of squamous debris is noted within the left mastoid cavity. After the debridement procedure, the left mastoid cavity is noted to be healthy without any residual cholesteatoma.  The patient continues to have cholesteatoma within the right epitympanum.  An attic retraction pocket is noted on the right side. Nose: External evaluation reveals normal support and skin without lesions. Dorsum is intact. Anterior rhinoscopy reveals healthy pink mucosa over anterior aspect of inferior turbinates and intact septum. No purulence noted. Oral cavity: Lips without lesions, oral mucosa moist, no masses or lesions seen. Pharynx: Clear, no erythema. Neck: Supple, full range of motion, no lymphadenopathy, no masses palpable. Salivary: Parotid and submandibular glands without mass. Neuro:  CN 2-12 grossly intact. Gait normal. Vestibular: No nystagmus at any point of  gaze.   AUDIOMETRIC TESTING:  Shows bilateral conductive hearing loss, significantly worse on the left. The speech reception threshold is 25dB AD and 35dB AS. The discrimination score is 100% AD and 92% AS. The tympanogram shows reduced TM mobility bilaterally.   Assessment 1.  A large amount of squamous debris is noted within the left mastoid cavity. After the debridement procedure, the left mastoid cavity is noted to be healthy without any residual cholesteatoma.   2.  The patient continues to have cholesteatoma within the right epitympanum.  An attic retraction pocket is noted on the right side.  This is consistent with residual right epitympanum cholesteatoma.   3.  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  This is stable from her preop audiogram.   Plan  1.  Otomicroscopy with debridement of the left mastoid cavity.   2.  The physical exam findings and the hearing test results are reviewed with the patient.  3.  The patient should observe bilateral dry ear precautions.  4.  We will proceed with right ear tympanomastoidectomy surgery to remove the remaining right ear cholesteatoma.  The risks, benefits, alternatives and details of the procedure are reviewed with the patient.  5.  The patient would like to proceed with the right ear procedure.

## 2016-07-07 NOTE — Anesthesia Postprocedure Evaluation (Signed)
Anesthesia Post Note  Patient: Amber Colon  Procedure(s) Performed: Procedure(s) (LRB): RIGHT TYMPANOMASTOIDECTOMY (Right)  Patient location during evaluation: PACU Anesthesia Type: General Level of consciousness: awake and alert and patient cooperative Pain management: pain level controlled Vital Signs Assessment: post-procedure vital signs reviewed and stable Respiratory status: spontaneous breathing and respiratory function stable Cardiovascular status: stable Anesthetic complications: no    Last Vitals:  Filed Vitals:   07/07/16 1159 07/07/16 1200  BP: 129/74   Pulse: 72 84  Temp:    Resp: 15 19    Last Pain:  Filed Vitals:   07/07/16 1205  PainSc: Asleep                 Nashya Garlington S

## 2016-07-07 NOTE — Anesthesia Procedure Notes (Signed)
Procedure Name: Intubation Date/Time: 07/07/2016 8:26 AM Performed by: Caren MacadamARTER, Trinity Haun W Pre-anesthesia Checklist: Patient identified, Emergency Drugs available, Suction available and Patient being monitored Patient Re-evaluated:Patient Re-evaluated prior to inductionOxygen Delivery Method: Circle system utilized Preoxygenation: Pre-oxygenation with 100% oxygen Intubation Type: IV induction Ventilation: Mask ventilation without difficulty Laryngoscope Size: Miller and 2 Grade View: Grade I Tube type: Oral Tube size: 7.0 mm Number of attempts: 1 Airway Equipment and Method: Stylet and Oral airway Placement Confirmation: ETT inserted through vocal cords under direct vision,  positive ETCO2 and breath sounds checked- equal and bilateral Secured at: 22 cm Tube secured with: Tape Dental Injury: Teeth and Oropharynx as per pre-operative assessment

## 2016-07-08 ENCOUNTER — Encounter (HOSPITAL_COMMUNITY): Payer: Self-pay | Admitting: Otolaryngology

## 2016-07-12 ENCOUNTER — Ambulatory Visit (INDEPENDENT_AMBULATORY_CARE_PROVIDER_SITE_OTHER): Payer: Medicaid Other | Admitting: Otolaryngology

## 2016-08-04 ENCOUNTER — Ambulatory Visit: Payer: Medicaid Other | Admitting: Pediatrics

## 2016-08-05 ENCOUNTER — Encounter: Payer: Self-pay | Admitting: Family

## 2016-08-12 ENCOUNTER — Ambulatory Visit (INDEPENDENT_AMBULATORY_CARE_PROVIDER_SITE_OTHER): Payer: Medicaid Other | Admitting: Pediatrics

## 2016-08-12 ENCOUNTER — Encounter: Payer: Self-pay | Admitting: Pediatrics

## 2016-08-12 VITALS — BP 125/90 | HR 77 | Temp 97.5°F | Ht 65.0 in | Wt 341.8 lb

## 2016-08-12 DIAGNOSIS — Z6841 Body Mass Index (BMI) 40.0 and over, adult: Secondary | ICD-10-CM

## 2016-08-12 DIAGNOSIS — F17200 Nicotine dependence, unspecified, uncomplicated: Secondary | ICD-10-CM

## 2016-08-12 DIAGNOSIS — Z720 Tobacco use: Secondary | ICD-10-CM

## 2016-08-12 DIAGNOSIS — F329 Major depressive disorder, single episode, unspecified: Secondary | ICD-10-CM

## 2016-08-12 DIAGNOSIS — R1010 Upper abdominal pain, unspecified: Secondary | ICD-10-CM | POA: Diagnosis not present

## 2016-08-12 DIAGNOSIS — G8929 Other chronic pain: Secondary | ICD-10-CM

## 2016-08-12 DIAGNOSIS — K219 Gastro-esophageal reflux disease without esophagitis: Secondary | ICD-10-CM

## 2016-08-12 DIAGNOSIS — R197 Diarrhea, unspecified: Secondary | ICD-10-CM

## 2016-08-12 DIAGNOSIS — E039 Hypothyroidism, unspecified: Secondary | ICD-10-CM | POA: Diagnosis not present

## 2016-08-12 DIAGNOSIS — F32A Depression, unspecified: Secondary | ICD-10-CM

## 2016-08-12 DIAGNOSIS — I10 Essential (primary) hypertension: Secondary | ICD-10-CM

## 2016-08-12 DIAGNOSIS — R1011 Right upper quadrant pain: Secondary | ICD-10-CM

## 2016-08-12 MED ORDER — CITALOPRAM HYDROBROMIDE 20 MG PO TABS: 20.0000 mg | ORAL_TABLET | Freq: Every day | ORAL | 3 refills | Status: DC

## 2016-08-12 MED ORDER — TRAZODONE HCL 50 MG PO TABS: 25.0000 mg | ORAL_TABLET | Freq: Every evening | ORAL | 2 refills | Status: DC | PRN

## 2016-08-12 NOTE — Progress Notes (Signed)
Subjective:   Patient ID: Amber Colon, female    DOB: July 04, 1981, 35 y.o.   MRN: [id number] CC: Depression; Hypertension; and Heartburn  HPI: Amber Colon is a 35 y.o. female presenting for Depression; Hypertension; and Heartburn  Feeling better with mood Hasnt picked up citalopram yet A couple times has had thoughts about not wanting to be here Now having trouble sleeping at times, wont get to bed until 9am, then sleeps until 2pm, feels like she doesn't get anything done Not seeing counselor yet Still stress re trying to get husband here  Taking motrin PM for sleep, doesn't help  Has cramps regularly R side Comes and goes Not associated with food Lasts about an hour Feels like menstrual cramp but not at time of period, stabbing and twisting pain Moving doesn't help Moving makes it worse  Regularly stooling, has regular loose stools related to IBS per pt Has diarrhea most days, urgency in the middle of the night every couple of weeks No blood in stools Not sure if diarrhea/symptoms worsen with certain foods  Taking omeprazole BID, before breakfast and before bed as that is when her symptoms are worst Not avoiding any foods   Depression screen [facility] 2/9 08/12/2016 06/30/2016 01/22/2016 06/18/2015  Decreased Interest 1 3 0 0  Down, Depressed, Hopeless 1 3 0 0  PHQ - 2 Score 2 6 0 0  Altered sleeping 3 3 - -  Tired, decreased energy 2 3 - -  Change in appetite 2 2 - -  Feeling bad or failure about yourself  1 3 - -  Trouble concentrating 1 2 - -  Moving slowly or fidgety/restless 1 1 - -  Suicidal thoughts 1 1 - -  PHQ-9 Score 13 21 - -  Difficult doing work/chores Somewhat difficult Somewhat difficult - -     Relevant past medical, surgical, family and social history reviewed. Allergies and medications reviewed and updated. History  Smoking Status  . Current Some Day Smoker  . Packs/day: 0.25  . Years: 2.00  Smokeless Tobacco  . Never Used   ROS: Per HPI    Objective:    BP 125/90 (BP Location: Right Wrist, Patient Position: Sitting, Cuff Size: Normal)   Pulse 77   Temp 97.5 F (36.4 C) (Oral)   Ht '5\' 5"'  (1.651 m)   Wt (!) 341 lb 12.8 oz (155 kg)   BMI 56.88 kg/m   Wt Readings from Last 3 Encounters:  08/12/16 (!) 341 lb 12.8 oz (155 kg)  07/07/16 (!) 345 lb (156.5 kg)  07/02/16 (!) 345 lb 11.2 oz (156.8 kg)    Gen: NAD, alert, cooperative with exam, NCAT EYES: EOMI, no conjunctival injection, or no icterus ENT:  TMs pearly gray b/l, OP without erythema LYMPH: no cervical LAD CV: NRRR, normal S1/S2, no murmur, distal pulses 2+ b/l Resp: CTABL, no wheezes, normal WOB Abd: +BS, soft, tender with palpation RUQ, epigastric area, obese. no guarding or organomegaly Ext: No edema, warm Neuro: Alert and oriented, strength equal b/l UE and LE, coordination grossly normal MSK: normal muscle bulk Psych: improved affect, no SI  Assessment & Plan:  Amber Colon was seen today for depression, hypertension and heartburn.  Diagnoses and all orders for this visit:  Essential hypertension Adequate control, will cont to follow Now on HCTZ alone  Depression Improving control Not yet established with counselor, pt to call to set up appt Feels safe at home Encouraged her to start SSRI, can take trazodone prn for  sleep -     citalopram (CELEXA) 20 MG tablet; Take 1 tablet (20 mg total) by mouth daily. -     traZODone (DESYREL) 50 MG tablet; Take 0.5-1 tablets (25-50 mg total) by mouth at bedtime as needed for sleep.  Hypothyroidism, unspecified hypothyroidism type Repeat labs today, cont meds -     TSH  Gastroesophageal reflux disease, esophagitis presence not specified Taking omeprazole BID, minimal improvement in symptoms Lying down at night symptoms worst Gave list of foods to avoid  Current smoker Not interested in quitting at this time Will cont to encourage  Morbid obesity with BMI of 50.0-59.9, adult ([place]) Stop sugary drinks  including sodas Cont other lifestyle changes  Diarrhea, unspecified type Keep food diary re diarrhea, GER symptoms -     Sedimentation rate -     CMP14+EGFR -     Tissue transglutaminase, IgA  Abdominal pain, chronic, right upper quadrant -     US Abdomen Limited RUQ; Future   Follow up plan: 8 weeks Assunta Found, MD [place]

## 2016-08-12 NOTE — Patient Instructions (Addendum)
Wake up at the same time every day No naps No TV starting 2 hours before your preferred bed time Take trazodone 1/2 tab to full tab as needed to help with sleep  Labs today Will get abdominal ultrasound  Start citalopram, take half pill for 8 days then full pill Call counselor on list to set up appointment   Food Choices for Gastroesophageal Reflux Disease, Adult When you have gastroesophageal reflux disease (GERD), the foods you eat and your eating habits are very important. Choosing the right foods can help ease your discomfort.  WHAT GUIDELINES DO I NEED TO FOLLOW?   Choose fruits, vegetables, whole grains, and low-fat dairy products.   Choose low-fat meat, fish, and poultry.  Limit fats such as oils, salad dressings, butter, nuts, and avocado.   Keep a food diary. This helps you identify foods that cause symptoms.   Avoid foods that cause symptoms. These may be different for everyone.   Eat small meals often instead of 3 large meals a day.   Eat your meals slowly, in a place where you are relaxed.   Limit fried foods.   Cook foods using methods other than frying.   Avoid drinking alcohol.   Avoid drinking large amounts of liquids with your meals.   Avoid bending over or lying down until 2-3 hours after eating.  WHAT FOODS ARE NOT RECOMMENDED?  These are some foods and drinks that may make your symptoms worse: Vegetables Tomatoes. Tomato juice. Tomato and spaghetti sauce. Chili peppers. Onion and garlic. Horseradish. Fruits Oranges, grapefruit, and lemon (fruit and juice). Meats High-fat meats, fish, and poultry. This includes hot dogs, ribs, ham, sausage, salami, and bacon. Dairy Whole milk and chocolate milk. Sour cream. Cream. Butter. Ice cream. Cream cheese.  Drinks Coffee and tea. Bubbly (carbonated) drinks or energy drinks. Condiments Hot sauce. Barbecue sauce.  Sweets/Desserts Chocolate and cocoa. Donuts. Peppermint and spearmint. Fats and  Oils High-fat foods. This includes [place] fries and potato chips. Other Vinegar. Strong spices. This includes black pepper, white pepper, red pepper, cayenne, curry powder, cloves, ginger, and chili powder. The items listed above may not be a complete list of foods and drinks to avoid. Contact your dietitian for more information.   This information is not intended to replace advice given to you by your health care provider. Make sure you discuss any questions you have with your health care provider.   Document Released: 06/06/2012 Document Revised: 12/27/2014 Document Reviewed: 10/10/2013 Elsevier Interactive Patient Education [employer]2016 [employer] Inc.

## 2016-08-15 LAB — TSH: TSH: 4.67 u[IU]/mL — AB (ref 0.450–4.500)

## 2016-08-15 LAB — CMP14+EGFR
A/G RATIO: 1.6 (ref 1.2–2.2)
ALK PHOS: 72 IU/L (ref 39–117)
ALT: 27 IU/L (ref 0–32)
AST: 29 IU/L (ref 0–40)
Albumin: 4.6 g/dL (ref 3.5–5.5)
BILIRUBIN TOTAL: 0.4 mg/dL (ref 0.0–1.2)
BUN/Creatinine Ratio: 11 (ref 9–23)
BUN: 11 mg/dL (ref 6–20)
CO2: 24 mmol/L (ref 18–29)
Calcium: 10 mg/dL (ref 8.7–10.2)
Chloride: 94 mmol/L — ABNORMAL LOW (ref 96–106)
Creatinine, Ser: 0.98 mg/dL (ref 0.57–1.00)
GFR calc Af Amer: 87 mL/min/{1.73_m2} (ref >59)
GFR, EST NON AFRICAN AMERICAN: 76 mL/min/{1.73_m2} (ref >59)
GLOBULIN, TOTAL: 2.9 g/dL (ref 1.5–4.5)
Glucose: 116 mg/dL — ABNORMAL HIGH (ref 65–99)
POTASSIUM: 3.3 mmol/L — AB (ref 3.5–5.2)
SODIUM: 139 mmol/L (ref 134–144)
Total Protein: 7.5 g/dL (ref 6.0–8.5)

## 2016-08-15 LAB — TISSUE TRANSGLUTAMINASE, IGA: Transglutaminase IgA: <2 U/mL (ref 0–3)

## 2016-08-15 LAB — SEDIMENTATION RATE: Sed Rate: 15 mm/hr (ref 0–32)

## 2016-08-17 ENCOUNTER — Other Ambulatory Visit: Payer: Self-pay | Admitting: Pediatrics

## 2016-08-17 DIAGNOSIS — E039 Hypothyroidism, unspecified: Secondary | ICD-10-CM

## 2016-08-17 MED ORDER — LEVOTHYROXINE SODIUM 75 MCG PO TABS: 75.0000 ug | ORAL_TABLET | Freq: Every day | ORAL | 4 refills | Status: DC

## 2016-08-20 NOTE — Progress Notes (Signed)
Scheduled and patient informed.

## 2016-08-24 ENCOUNTER — Ambulatory Visit (HOSPITAL_COMMUNITY)
Admission: RE | Admit: 2016-08-24 | Discharge: 2016-08-24 | Disposition: A | Discharge status: Home / Self Care | Payer: Medicaid Other | Source: Ambulatory Visit | Attending: Pediatrics | Admitting: Pediatrics

## 2016-08-24 DIAGNOSIS — G8929 Other chronic pain: Secondary | ICD-10-CM | POA: Insufficient documentation

## 2016-08-24 DIAGNOSIS — K8020 Calculus of gallbladder without cholecystitis without obstruction: Secondary | ICD-10-CM | POA: Diagnosis not present

## 2016-08-24 DIAGNOSIS — R932 Abnormal findings on diagnostic imaging of liver and biliary tract: Secondary | ICD-10-CM | POA: Diagnosis not present

## 2016-08-24 DIAGNOSIS — R1011 Right upper quadrant pain: Secondary | ICD-10-CM

## 2016-08-24 DIAGNOSIS — R1010 Upper abdominal pain, unspecified: Secondary | ICD-10-CM | POA: Diagnosis present

## 2016-08-30 ENCOUNTER — Other Ambulatory Visit: Payer: Self-pay | Admitting: Pediatrics

## 2016-08-30 DIAGNOSIS — K8020 Calculus of gallbladder without cholecystitis without obstruction: Secondary | ICD-10-CM

## 2016-08-31 ENCOUNTER — Ambulatory Visit: Payer: Self-pay | Admitting: General Surgery

## 2016-09-07 ENCOUNTER — Encounter (HOSPITAL_COMMUNITY): Payer: Self-pay | Admitting: *Deleted

## 2016-09-07 MED ORDER — CIPROFLOXACIN IN D5W 400 MG/200ML IV SOLN
400.0000 mg | INTRAVENOUS | Status: AC
  Administered 2016-09-08: 200 mg via INTRAVENOUS
  Filled 2016-09-07 (×2): qty 200

## 2016-09-08 ENCOUNTER — Ambulatory Visit (HOSPITAL_COMMUNITY)
Admission: RE | Admit: 2016-09-08 | Discharge: 2016-09-09 | Disposition: A | Discharge status: Home / Self Care | Payer: Medicaid Other | Source: Ambulatory Visit | Attending: General Surgery | Admitting: General Surgery

## 2016-09-08 ENCOUNTER — Encounter (HOSPITAL_COMMUNITY): Payer: Self-pay | Admitting: *Deleted

## 2016-09-08 ENCOUNTER — Ambulatory Visit (HOSPITAL_COMMUNITY): Payer: Medicaid Other

## 2016-09-08 ENCOUNTER — Ambulatory Visit (HOSPITAL_COMMUNITY): Payer: Medicaid Other | Admitting: Anesthesiology

## 2016-09-08 ENCOUNTER — Encounter (HOSPITAL_COMMUNITY)
Admission: RE | Disposition: A | Discharge status: Home / Self Care | Payer: Self-pay | Source: Ambulatory Visit | Attending: General Surgery

## 2016-09-08 DIAGNOSIS — K8010 Calculus of gallbladder with chronic cholecystitis without obstruction: Secondary | ICD-10-CM | POA: Diagnosis present

## 2016-09-08 DIAGNOSIS — Z880 Allergy status to penicillin: Secondary | ICD-10-CM | POA: Diagnosis not present

## 2016-09-08 DIAGNOSIS — I10 Essential (primary) hypertension: Secondary | ICD-10-CM | POA: Insufficient documentation

## 2016-09-08 DIAGNOSIS — Z6841 Body Mass Index (BMI) 40.0 and over, adult: Secondary | ICD-10-CM | POA: Diagnosis not present

## 2016-09-08 DIAGNOSIS — K219 Gastro-esophageal reflux disease without esophagitis: Secondary | ICD-10-CM | POA: Insufficient documentation

## 2016-09-08 DIAGNOSIS — F17200 Nicotine dependence, unspecified, uncomplicated: Secondary | ICD-10-CM | POA: Diagnosis not present

## 2016-09-08 DIAGNOSIS — K819 Cholecystitis, unspecified: Secondary | ICD-10-CM

## 2016-09-08 DIAGNOSIS — E039 Hypothyroidism, unspecified: Secondary | ICD-10-CM | POA: Insufficient documentation

## 2016-09-08 HISTORY — PX: CHOLECYSTECTOMY: SHX55

## 2016-09-08 HISTORY — DX: Personal history of other medical treatment: Z92.89

## 2016-09-08 LAB — CBC
HEMATOCRIT: 40.8 % (ref 36.0–46.0)
HEMOGLOBIN: 13.1 g/dL (ref 12.0–15.0)
MCH: 27.9 pg (ref 26.0–34.0)
MCHC: 32.1 g/dL (ref 30.0–36.0)
MCV: 87 fL (ref 78.0–100.0)
Platelets: 219 10*3/uL (ref 150–400)
RBC: 4.69 MIL/uL (ref 3.87–5.11)
RDW: 13.5 % (ref 11.5–15.5)
WBC: 6.7 10*3/uL (ref 4.0–10.5)

## 2016-09-08 LAB — BASIC METABOLIC PANEL
ANION GAP: 11 (ref 5–15)
BUN: 7 mg/dL (ref 6–20)
CO2: 26 mmol/L (ref 22–32)
Calcium: 9.4 mg/dL (ref 8.9–10.3)
Chloride: 100 mmol/L — ABNORMAL LOW (ref 101–111)
Creatinine, Ser: 1.03 mg/dL — ABNORMAL HIGH (ref 0.44–1.00)
GFR calc non Af Amer: >60 mL/min (ref >60)
GLUCOSE: 116 mg/dL — AB (ref 65–99)
POTASSIUM: 2.9 mmol/L — AB (ref 3.5–5.1)
SODIUM: 137 mmol/L (ref 135–145)

## 2016-09-08 LAB — HCG, SERUM, QUALITATIVE: Preg, Serum: NEGATIVE

## 2016-09-08 SURGERY — LAPAROSCOPIC CHOLECYSTECTOMY WITH INTRAOPERATIVE CHOLANGIOGRAM
Anesthesia: General | Site: Abdomen

## 2016-09-08 MED ORDER — PROPOFOL 10 MG/ML IV BOLUS
INTRAVENOUS | Status: AC
  Filled 2016-09-08: qty 20

## 2016-09-08 MED ORDER — KCL IN DEXTROSE-NACL 20-5-0.9 MEQ/L-%-% IV SOLN
INTRAVENOUS | Status: DC
  Administered 2016-09-08: 17:00:00 via INTRAVENOUS
  Filled 2016-09-08 (×2): qty 1000

## 2016-09-08 MED ORDER — PROPOFOL 10 MG/ML IV BOLUS
INTRAVENOUS | Status: DC | PRN
  Administered 2016-09-08: 30 mg via INTRAVENOUS
  Administered 2016-09-08: 200 mg via INTRAVENOUS

## 2016-09-08 MED ORDER — CEFAZOLIN SODIUM 1 G IJ SOLR
INTRAMUSCULAR | Status: DC | PRN
  Administered 2016-09-08: 3 g via INTRAMUSCULAR

## 2016-09-08 MED ORDER — HYDROCHLOROTHIAZIDE 25 MG PO TABS
25.0000 mg | ORAL_TABLET | Freq: Every day | ORAL | Status: DC
  Administered 2016-09-08 – 2016-09-09 (×2): 25 mg via ORAL
  Filled 2016-09-08 (×2): qty 1

## 2016-09-08 MED ORDER — ONDANSETRON HCL 4 MG/2ML IJ SOLN
4.0000 mg | Freq: Once | INTRAMUSCULAR | Status: AC | PRN
  Administered 2016-09-08: 4 mg via INTRAVENOUS

## 2016-09-08 MED ORDER — HEPARIN SODIUM (PORCINE) 5000 UNIT/ML IJ SOLN
5000.0000 [IU] | Freq: Three times a day (TID) | INTRAMUSCULAR | Status: DC
  Administered 2016-09-09: 5000 [IU] via SUBCUTANEOUS
  Filled 2016-09-08: qty 1

## 2016-09-08 MED ORDER — DICYCLOMINE HCL 20 MG PO TABS
20.0000 mg | ORAL_TABLET | Freq: Three times a day (TID) | ORAL | Status: DC
  Administered 2016-09-08 – 2016-09-09 (×4): 20 mg via ORAL
  Filled 2016-09-08 (×4): qty 1

## 2016-09-08 MED ORDER — MIDAZOLAM HCL 2 MG/2ML IJ SOLN
INTRAMUSCULAR | Status: AC
  Filled 2016-09-08: qty 2

## 2016-09-08 MED ORDER — FENTANYL CITRATE (PF) 100 MCG/2ML IJ SOLN
INTRAMUSCULAR | Status: AC
  Filled 2016-09-08: qty 2

## 2016-09-08 MED ORDER — MORPHINE SULFATE (PF) 2 MG/ML IV SOLN
1.0000 mg | INTRAVENOUS | Status: DC | PRN
  Administered 2016-09-09: 4 mg via INTRAVENOUS
  Filled 2016-09-08: qty 2

## 2016-09-08 MED ORDER — IOPAMIDOL (ISOVUE-300) INJECTION 61%
INTRAVENOUS | Status: AC
  Filled 2016-09-08: qty 50

## 2016-09-08 MED ORDER — PANTOPRAZOLE SODIUM 40 MG PO TBEC
40.0000 mg | DELAYED_RELEASE_TABLET | Freq: Every day | ORAL | Status: DC
  Administered 2016-09-09: 40 mg via ORAL
  Filled 2016-09-08: qty 1

## 2016-09-08 MED ORDER — CHLORHEXIDINE GLUCONATE CLOTH 2 % EX PADS: 6.0000 | MEDICATED_PAD | Freq: Once | CUTANEOUS | Status: DC

## 2016-09-08 MED ORDER — LACTATED RINGERS IV SOLN
INTRAVENOUS | Status: DC | PRN
  Administered 2016-09-08 (×3): via INTRAVENOUS

## 2016-09-08 MED ORDER — SODIUM CHLORIDE 0.9 % IR SOLN
Status: DC | PRN
  Administered 2016-09-08: 1000 mL

## 2016-09-08 MED ORDER — SUGAMMADEX SODIUM 200 MG/2ML IV SOLN
INTRAVENOUS | Status: AC
  Filled 2016-09-08: qty 2

## 2016-09-08 MED ORDER — ONDANSETRON HCL 4 MG/2ML IJ SOLN
INTRAMUSCULAR | Status: AC
  Administered 2016-09-08: 4 mg via INTRAVENOUS
  Filled 2016-09-08: qty 2

## 2016-09-08 MED ORDER — LACTATED RINGERS IV SOLN
Freq: Once | INTRAVENOUS | Status: AC
  Administered 2016-09-08: 09:00:00 via INTRAVENOUS

## 2016-09-08 MED ORDER — ROCURONIUM BROMIDE 100 MG/10ML IV SOLN
INTRAVENOUS | Status: DC | PRN
  Administered 2016-09-08: 10 mg via INTRAVENOUS
  Administered 2016-09-08: 50 mg via INTRAVENOUS

## 2016-09-08 MED ORDER — HYDROCODONE-ACETAMINOPHEN 5-325 MG PO TABS
1.0000 | ORAL_TABLET | ORAL | Status: DC | PRN
  Administered 2016-09-08 – 2016-09-09 (×4): 2 via ORAL
  Filled 2016-09-08 (×4): qty 2

## 2016-09-08 MED ORDER — TRAZODONE HCL 50 MG PO TABS
25.0000 mg | ORAL_TABLET | Freq: Every evening | ORAL | Status: DC | PRN
Start: 2016-09-08 — End: 2016-09-09

## 2016-09-08 MED ORDER — OXYCODONE HCL 5 MG PO TABS: 5.0000 mg | ORAL_TABLET | Freq: Once | ORAL | Status: DC | PRN

## 2016-09-08 MED ORDER — BUPIVACAINE-EPINEPHRINE (PF) 0.25% -1:200000 IJ SOLN
INTRAMUSCULAR | Status: AC
  Filled 2016-09-08: qty 30

## 2016-09-08 MED ORDER — LIDOCAINE HCL (CARDIAC) 20 MG/ML IV SOLN
INTRAVENOUS | Status: DC | PRN
  Administered 2016-09-08: 100 mg via INTRAVENOUS

## 2016-09-08 MED ORDER — SUGAMMADEX SODIUM 200 MG/2ML IV SOLN
INTRAVENOUS | Status: DC | PRN
  Administered 2016-09-08: 313 mg via INTRAVENOUS

## 2016-09-08 MED ORDER — DEXAMETHASONE SODIUM PHOSPHATE 10 MG/ML IJ SOLN
INTRAMUSCULAR | Status: AC
  Filled 2016-09-08: qty 1

## 2016-09-08 MED ORDER — ONDANSETRON HCL 4 MG/2ML IJ SOLN
INTRAMUSCULAR | Status: AC
  Filled 2016-09-08: qty 2

## 2016-09-08 MED ORDER — OXYCODONE HCL 5 MG/5ML PO SOLN: 5.0000 mg | Freq: Once | ORAL | Status: DC | PRN

## 2016-09-08 MED ORDER — IOPAMIDOL (ISOVUE-300) INJECTION 61%
INTRAVENOUS | Status: DC | PRN
  Administered 2016-09-08: 10 mL

## 2016-09-08 MED ORDER — FENTANYL CITRATE (PF) 100 MCG/2ML IJ SOLN
INTRAMUSCULAR | Status: AC
  Filled 2016-09-08: qty 4

## 2016-09-08 MED ORDER — HYDROMORPHONE HCL 1 MG/ML IJ SOLN
0.2500 mg | INTRAMUSCULAR | Status: DC | PRN
  Administered 2016-09-08: 0.5 mg via INTRAVENOUS

## 2016-09-08 MED ORDER — ONDANSETRON HCL 4 MG/2ML IJ SOLN: 4.0000 mg | Freq: Four times a day (QID) | INTRAMUSCULAR | Status: DC | PRN

## 2016-09-08 MED ORDER — 0.9 % SODIUM CHLORIDE (POUR BTL) OPTIME
TOPICAL | Status: DC | PRN
  Administered 2016-09-08: 1000 mL

## 2016-09-08 MED ORDER — LEVOTHYROXINE SODIUM 75 MCG PO TABS
75.0000 ug | ORAL_TABLET | Freq: Every day | ORAL | Status: DC
  Administered 2016-09-08 – 2016-09-09 (×2): 75 ug via ORAL
  Filled 2016-09-08 (×2): qty 1

## 2016-09-08 MED ORDER — FENTANYL CITRATE (PF) 100 MCG/2ML IJ SOLN
INTRAMUSCULAR | Status: DC | PRN
  Administered 2016-09-08: 100 ug via INTRAVENOUS
  Administered 2016-09-08 (×3): 25 ug via INTRAVENOUS
  Administered 2016-09-08 (×2): 50 ug via INTRAVENOUS
  Administered 2016-09-08: 25 ug via INTRAVENOUS

## 2016-09-08 MED ORDER — CITALOPRAM HYDROBROMIDE 20 MG PO TABS
20.0000 mg | ORAL_TABLET | Freq: Every day | ORAL | Status: DC
  Administered 2016-09-09: 20 mg via ORAL
  Filled 2016-09-08: qty 1

## 2016-09-08 MED ORDER — CEFAZOLIN SODIUM 1 G IJ SOLR
INTRAMUSCULAR | Status: AC
  Filled 2016-09-08: qty 30

## 2016-09-08 MED ORDER — DEXAMETHASONE SODIUM PHOSPHATE 10 MG/ML IJ SOLN
INTRAMUSCULAR | Status: DC | PRN
  Administered 2016-09-08: 10 mg via INTRAVENOUS

## 2016-09-08 MED ORDER — LIDOCAINE 2% (20 MG/ML) 5 ML SYRINGE
INTRAMUSCULAR | Status: AC
  Filled 2016-09-08: qty 5

## 2016-09-08 MED ORDER — ONDANSETRON HCL 4 MG/2ML IJ SOLN
INTRAMUSCULAR | Status: DC | PRN
  Administered 2016-09-08: 4 mg via INTRAVENOUS

## 2016-09-08 MED ORDER — MIDAZOLAM HCL 5 MG/5ML IJ SOLN
INTRAMUSCULAR | Status: DC | PRN
  Administered 2016-09-08: 2 mg via INTRAVENOUS

## 2016-09-08 MED ORDER — BUPIVACAINE-EPINEPHRINE 0.25% -1:200000 IJ SOLN
INTRAMUSCULAR | Status: DC | PRN
Start: 2016-09-08 — End: 2016-09-08
  Administered 2016-09-08: 20 mL

## 2016-09-08 MED ORDER — ONDANSETRON 4 MG PO TBDP: 4.0000 mg | ORAL_TABLET | Freq: Four times a day (QID) | ORAL | Status: DC | PRN

## 2016-09-08 MED ORDER — HYDROMORPHONE HCL 1 MG/ML IJ SOLN
INTRAMUSCULAR | Status: AC
  Administered 2016-09-08: 0.5 mg via INTRAVENOUS
  Filled 2016-09-08: qty 1

## 2016-09-08 SURGICAL SUPPLY — 36 items
APPLIER CLIP 5 13 M/L LIGAMAX5 (MISCELLANEOUS) ×3
APR CLP MED LRG 5 ANG JAW (MISCELLANEOUS) ×1
BAG SPEC RTRVL LRG 6X4 10 (ENDOMECHANICALS) ×1
BLADE SURG ROTATE 9660 (MISCELLANEOUS) IMPLANT
CANISTER SUCTION 2500CC (MISCELLANEOUS) ×3 IMPLANT
CATH REDDICK CHOLANGI 4FR 50CM (CATHETERS) ×3 IMPLANT
CHLORAPREP W/TINT 26ML (MISCELLANEOUS) ×3 IMPLANT
CLIP APPLIE 5 13 M/L LIGAMAX5 (MISCELLANEOUS) ×1 IMPLANT
COVER MAYO STAND STRL (DRAPES) ×3 IMPLANT
COVER SURGICAL LIGHT HANDLE (MISCELLANEOUS) ×3 IMPLANT
DRAPE C-ARM 42X72 X-RAY (DRAPES) ×3 IMPLANT
ELECT REM PT RETURN 9FT ADLT (ELECTROSURGICAL) ×3
ELECTRODE REM PT RTRN 9FT ADLT (ELECTROSURGICAL) ×1 IMPLANT
GLOVE BIO SURGEON STRL SZ7 (GLOVE) ×4 IMPLANT
GLOVE BIO SURGEON STRL SZ7.5 (GLOVE) ×5 IMPLANT
GLOVE INDICATOR 7.5 STRL GRN (GLOVE) ×2 IMPLANT
GOWN STRL REUS W/ TWL LRG LVL3 (GOWN DISPOSABLE) ×3 IMPLANT
GOWN STRL REUS W/TWL LRG LVL3 (GOWN DISPOSABLE) ×9
IV CATH 14GX2 1/4 (CATHETERS) ×3 IMPLANT
KIT BASIN OR (CUSTOM PROCEDURE TRAY) ×3 IMPLANT
KIT ROOM TURNOVER OR (KITS) ×3 IMPLANT
LIQUID BAND (GAUZE/BANDAGES/DRESSINGS) ×3 IMPLANT
NS IRRIG 1000ML POUR BTL (IV SOLUTION) ×3 IMPLANT
PAD ARMBOARD 7.5X6 YLW CONV (MISCELLANEOUS) ×3 IMPLANT
POUCH SPECIMEN RETRIEVAL 10MM (ENDOMECHANICALS) ×3 IMPLANT
SCISSORS LAP 5X35 DISP (ENDOMECHANICALS) ×3 IMPLANT
SET IRRIG TUBING LAPAROSCOPIC (IRRIGATION / IRRIGATOR) ×3 IMPLANT
SLEEVE ENDOPATH XCEL 5M (ENDOMECHANICALS) ×6 IMPLANT
SPECIMEN JAR SMALL (MISCELLANEOUS) ×3 IMPLANT
SUT MNCRL AB 4-0 PS2 18 (SUTURE) ×3 IMPLANT
TOWEL OR 17X24 6PK STRL BLUE (TOWEL DISPOSABLE) ×3 IMPLANT
TOWEL OR 17X26 10 PK STRL BLUE (TOWEL DISPOSABLE) ×3 IMPLANT
TRAY LAPAROSCOPIC MC (CUSTOM PROCEDURE TRAY) ×3 IMPLANT
TROCAR XCEL BLUNT TIP 100MML (ENDOMECHANICALS) ×3 IMPLANT
TROCAR XCEL NON-BLD 5MMX100MML (ENDOMECHANICALS) ×3 IMPLANT
TUBING INSUFFLATION (TUBING) ×3 IMPLANT

## 2016-09-08 NOTE — Anesthesia Postprocedure Evaluation (Signed)
Anesthesia Post Note  Patient: Amber Colon  Procedure(s) Performed: Procedure(s) (LRB): LAPAROSCOPIC CHOLECYSTECTOMY WITH INTRAOPERATIVE CHOLANGIOGRAM (N/A)  Patient location during evaluation: PACU Anesthesia Type: General Level of consciousness: awake, awake and alert and oriented Pain management: pain level controlled Vital Signs Assessment: post-procedure vital signs reviewed and stable Respiratory status: spontaneous breathing, nonlabored ventilation and respiratory function stable Cardiovascular status: blood pressure returned to baseline Postop Assessment: no headache Anesthetic complications: no    Last Vitals:  Vitals:   09/08/16 1315 09/08/16 1330  BP: (!) 144/81 133/88  Pulse: 72 70  Resp: (!) 24 20  Temp:      Last Pain:  Vitals:   09/08/16 1330  TempSrc:   PainSc: 3                  [name]

## 2016-09-08 NOTE — H&P (Signed)
Amber [facility]  [facility] Patient #: [id number] DOB: 03/31/1981 Married / Language: English / Race: White Female   History of Present Illness The patient is a 35 year old female who presents with abdominal pain. We are asked to see the patient in consultation by Dr. Rex Krasarol Colon to evaluate her for gallstones. The patient is a 35 year old white female who has been experiencing right-sided abdominal pain for the last months. The pain is been associated with nausea. She has had a couple episodes of vomiting. She states that the pain occurs all the time. She recently underwent an ultrasound which showed some stones in the gallbladder and some mild gallbladder wall thickening. Her liver functions were normal.   Other Problems  Anxiety Disorder Back Pain Depression Gastroesophageal Reflux Disease Hemorrhoids High blood pressure Thyroid Disease  Past Surgical History No pertinent past surgical history  Diagnostic Studies History  Colonoscopy never Mammogram never Pap Smear 1-5 years ago  Allergies  Amoxicillin *PENICILLINS* Diarrhea, Nausea, Vomiting.  Medication History  CeleXA (20MG  Tablet, Oral) Active. Bentyl (20MG  Tablet, Oral) Active. Ferrous Sulfate (325 (65 Fe)MG Tablet, Oral) Active. HydroCHLOROthiazide (25MG  Tablet, Oral) Active. Levothyroxine Sodium (75MCG Tablet, Oral) Active. Omeprazole (20MG  Tablet DR, Oral) Active. TraZODone HCl (50MG  Tablet, Oral) Active. Medications Reconciled  Social History Alcohol use Remotely quit alcohol use. Caffeine use Carbonated beverages, Coffee, Tea. No drug use Tobacco use Current every day smoker.  Family History  Arthritis Mother. Breast Cancer Family Members In General. Cervical Cancer Mother. Diabetes Mellitus Father, Mother. Heart Disease Father, Mother. Heart disease in female family member before age 35 Heart disease in female family member before age  35 Hypertension Father, Mother. Ovarian Cancer Mother. Respiratory Condition Father, Mother.  Pregnancy / Birth History Age at menarche 11 years. Gravida 4 Maternal age 35-20 Para 3 Regular periods    Review of Systems General Present- Chills, Fatigue and Night Sweats. Not Present- Appetite Loss, Fever, Weight Gain and Weight Loss. HEENT Present- Hearing Loss. Not Present- Earache, Hoarseness, Nose Bleed, Oral Ulcers, Ringing in the Ears, Seasonal Allergies, Sinus Pain, Sore Throat, Visual Disturbances, Wears glasses/contact lenses and Yellow Eyes. Respiratory Not Present- Bloody sputum, Chronic Cough, Difficulty Breathing, Snoring and Wheezing. Cardiovascular Present- Leg Cramps and Swelling of Extremities. Not Present- Chest Pain, Difficulty Breathing Lying Down, Palpitations, Rapid Heart Rate and Shortness of Breath. Gastrointestinal Present- Abdominal Pain, Bloating, Chronic diarrhea and Hemorrhoids. Not Present- Bloody Stool, Change in Bowel Habits, Constipation, Difficulty Swallowing, Excessive gas, Gets full quickly at meals, Indigestion, Nausea, Rectal Pain and Vomiting. Female Genitourinary Present- Pelvic Pain. Not Present- Frequency, Nocturia, Painful Urination and Urgency. Musculoskeletal Present- Back Pain, Joint Pain, Joint Stiffness, Muscle Pain, Muscle Weakness and Swelling of Extremities. Neurological Present- Headaches, Numbness and Weakness. Not Present- Decreased Memory, Fainting, Seizures, Tingling, Tremor and Trouble walking. Psychiatric Present- Anxiety, Depression and Frequent crying. Not Present- Bipolar, Change in Sleep Pattern and Fearful. Endocrine Not Present- Cold Intolerance, Excessive Hunger, Hair Changes, Heat Intolerance, Hot flashes and New Diabetes. Hematology Not Present- Blood Thinners, Easy Bruising, Excessive bleeding, Gland problems, HIV and Persistent Infections.  Vitals  Weight: 338 lb Height: 66in Body Surface Area: 2.5 m Body  Mass Index: 54.55 kg/m  Temp.: 97.63F  Pulse: 88 (Regular)  BP: 136/86 (Sitting, Left Arm, Standard)       Physical Exam  General Mental Status-Alert. General Appearance-Consistent with stated age. Hydration-Well hydrated. Voice-Normal.  Head and Neck Head-normocephalic, atraumatic with no lesions or palpable masses. Trachea-midline. Thyroid Gland Characteristics - normal  size and consistency.  Eye Eyeball - Bilateral-Extraocular movements intact. Sclera/Conjunctiva - Bilateral-No scleral icterus.  Chest and Lung Exam Chest and lung exam reveals -quiet, even and easy respiratory effort with no use of accessory muscles and on auscultation, normal breath sounds, no adventitious sounds and normal vocal resonance. Inspection Chest Wall - Normal. Back - normal.  Cardiovascular Cardiovascular examination reveals -normal heart sounds, regular rate and rhythm with no murmurs and normal pedal pulses bilaterally.  Abdomen Note: The abdomen is soft. There is moderate right-sided tenderness. There is no palpable mass. She is morbidly obese.   Neurologic Neurologic evaluation reveals -alert and oriented x 3 with no impairment of recent or remote memory. Mental Status-Normal.  Musculoskeletal Normal Exam - Left-Upper Extremity Strength Normal and Lower Extremity Strength Normal. Normal Exam - Right-Upper Extremity Strength Normal and Lower Extremity Strength Normal.  Lymphatic Head & Neck  General Head & Neck Lymphatics: Bilateral - Description - Normal. Axillary  General Axillary Region: Bilateral - Description - Normal. Tenderness - Non Tender. Femoral & Inguinal  Generalized Femoral & Inguinal Lymphatics: Bilateral - Description - Normal. Tenderness - Non Tender.    Assessment & Plan  CHOLELITHIASIS WITH CHOLECYSTITIS WITHOUT OBSTRUCTION (K80.10) CALCULUS OF GALLBLADDER WITH CHRONIC CHOLECYSTITIS WITHOUT OBSTRUCTION  (K80.10) Impression: The patient appears to have symptomatic gallstones with some mild thickening of the gallbladder wall. Because of the risk of further painful episodes and possible pancreatitis I think she would benefit from having her gallbladder removed. She would also like to have this done. I have discussed with her in detail the risks and benefits of the operation to remove the gallbladder as well as some of the technical aspects and she understands and wishes to proceed. Because she lives alone she will probably need to stay in the hospital overnight for observation. Current Plans Pt Education - Gallstones: discussed with patient and provided information.

## 2016-09-08 NOTE — Transfer of Care (Signed)
Immediate Anesthesia Transfer of Care Note  Patient: Amber Colon  Procedure(s) Performed: Procedure(s): LAPAROSCOPIC CHOLECYSTECTOMY WITH INTRAOPERATIVE CHOLANGIOGRAM (N/A)  Patient Location: PACU  Anesthesia Type:General  Level of Consciousness: awake and patient cooperative  Airway & Oxygen Therapy: Patient Spontanous Breathing and Patient connected to face mask oxygen  Post-op Assessment: Report given to RN and Post -op Vital signs reviewed and stable  Post vital signs: Reviewed and stable  Last Vitals:  Vitals:   09/08/16 0922  BP: 124/80  Pulse: 65  Resp: 20  Temp: 37.2 C    Last Pain:  Vitals:   09/08/16 0922  TempSrc: Oral  PainSc:       Patients Stated Pain Goal: 3 (09/08/16 0909)  Complications: No apparent anesthesia complications

## 2016-09-08 NOTE — Op Note (Signed)
09/08/2016  11:33 AM  PATIENT:  Amber Colon  35 y.o. female  PRE-OPERATIVE DIAGNOSIS:  Cholecystitis with cholelithiasis  POST-OPERATIVE DIAGNOSIS:  Cholecystitis with cholelithiasis  PROCEDURE:  Procedure(s): LAPAROSCOPIC CHOLECYSTECTOMY WITH INTRAOPERATIVE CHOLANGIOGRAM (N/A)  SURGEON:  Surgeon(s) and Role:    * Manus Rudd, MD - Assisting    * Chevis Pretty III, MD - Primary  PHYSICIAN ASSISTANT:   ASSISTANTS: Dr. Corliss Skains   ANESTHESIA:   general  EBL:  Total I/O In: 2100 [I.V.:2100] Out: 30 [Blood:30]  BLOOD ADMINISTERED:none  DRAINS: none   LOCAL MEDICATIONS USED:  MARCAINE     SPECIMEN:  Source of Specimen:  gallbladder  DISPOSITION OF SPECIMEN:  PATHOLOGY  COUNTS:  YES  TOURNIQUET:  * No tourniquets in log *  DICTATION: .Dragon Dictation   Procedure: After informed consent was obtained the patient was brought to the operating room and placed in the supine position on the operating room table. After adequate induction of general anesthesia the patient's abdomen was prepped with ChloraPrep allowed to dry and draped in usual sterile manner. An appropriate timeout was performed. The area below the umbilicus was infiltrated with quarter percent  Marcaine. A small incision was made with a 15 blade knife. The incision was carried down through the subcutaneous tissue bluntly with a hemostat and [employer] retractors. The linea alba was identified. The linea alba was incised with a 15 blade knife and each side was grasped with Coker clamps. The preperitoneal space was then probed with a hemostat until the peritoneum was opened and access was gained to the abdominal cavity. A 0 Vicryl pursestring stitch was placed in the fascia surrounding the opening. A Hassan cannula was then placed through the opening and anchored in place with the previously placed Vicryl purse string stitch. The abdomen was insufflated with carbon dioxide without difficulty. A laparoscope was inserted  through the [facility] cannula in the right upper quadrant was inspected. Next the epigastric region was infiltrated with % Marcaine. A small incision was made with a 15 blade knife. A 5 mm port was placed bluntly through this incision into the abdominal cavity under direct vision. Next 2 sites were chosen laterally on the right side of the abdomen for placement of 5 mm ports. Each of these areas was infiltrated with quarter percent Marcaine. Small stab incisions were made with a 15 blade knife. 5 mm ports were then placed bluntly through these incisions into the abdominal cavity under direct vision without difficulty. A blunt grasper was placed through the lateralmost 5 mm port and used to grasp the dome of the gallbladder and elevated anteriorly and superiorly. Another blunt grasper was placed through the other 5 mm port and used to retract the body and neck of the gallbladder. A dissector was placed through the epigastric port and using the electrocautery the peritoneal reflection at the gallbladder neck was opened. Blunt dissection was then carried out in this area until the gallbladder neck-cystic duct junction was readily identified and a good window was created. A single clip was placed on the gallbladder neck. A small  ductotomy was made just below the clip with laparoscopic scissors. A 14-gauge Angiocath was then placed through the anterior abdominal wall under direct vision. A Reddick cholangiogram catheter was then placed through the Angiocath and flushed. The catheter was then placed in the cystic duct and anchored in place with a clip. A cholangiogram was obtained that showed no filling defects good emptying into the duodenum an adequate length on the  cystic duct. The anchoring clip and catheters were then removed from the patient. 3 clips were placed proximally on the cystic duct and the duct was divided between the 2 sets of clips. Posterior to this the cystic artery was identified and again dissected  bluntly in a circumferential manner until a good window  was created. 2 clips were placed proximally and one distally on the artery and the artery was divided between the 2 sets of clips. Next a laparoscopic hook cautery device was used to separate the gallbladder from the liver bed. Prior to completely detaching the gallbladder from the liver bed the liver bed was inspected and several small bleeding points were coagulated with the electrocautery until the area was completely hemostatic. The gallbladder was then detached the rest of it from the liver bed without difficulty. A laparoscopic bag was inserted through the hassan port. The laparoscope was moved to the epigastric port. The gallbladder was placed within the bag and the bag was sealed.  The bag with the gallbladder was then removed with the [employer] cannula through the infraumbilical port without difficulty. The fascial defect was then closed with the previously placed Vicryl pursestring stitch as well as with another figure-of-eight 0 Vicryl stitch. The liver bed was inspected again and found to be hemostatic. The abdomen was irrigated with copious amounts of saline until the effluent was clear. The ports were then removed under direct vision without difficulty and were found to be hemostatic. The gas was allowed to escape. The skin incisions were all closed with interrupted 4-0 Monocryl subcuticular stitches. Dermabond dressings were applied. The patient tolerated the procedure well. At the end of the case all needle sponge and instrument counts were correct. The patient was then awakened and taken to recovery in stable condition  PLAN OF CARE: Admit for overnight observation  PATIENT DISPOSITION:  PACU - hemodynamically stable.   Delay start of Pharmacological VTE agent (>24hrs) due to surgical blood loss or risk of bleeding: no

## 2016-09-08 NOTE — Anesthesia Procedure Notes (Addendum)
Procedure Name: Intubation Date/Time: 09/08/2016 10:18 AM Performed by: Romie MinusOCK, Lossie Kalp K Pre-anesthesia Checklist: Patient identified, Emergency Drugs available, Suction available and Patient being monitored Patient Re-evaluated:Patient Re-evaluated prior to inductionOxygen Delivery Method: Circle System Utilized Preoxygenation: Pre-oxygenation with 100% oxygen Intubation Type: IV induction Ventilation: Mask ventilation without difficulty, Oral airway inserted - appropriate to patient size and Two handed mask ventilation required Laryngoscope Size: Miller and 2 Grade View: Grade I Tube type: Oral Tube size: 7.0 mm Number of attempts: 1 Airway Equipment and Method: Stylet and Oral airway Placement Confirmation: ETT inserted through vocal cords under direct vision,  positive ETCO2 and breath sounds checked- equal and bilateral Secured at: 20 cm Tube secured with: Tape Dental Injury: Teeth and Oropharynx as per pre-operative assessment

## 2016-09-08 NOTE — OR Nursing (Signed)
Ms. Tasia Catchingshmed transported to [id number] via bed and medical devices.  Pain level is tolerable and hand off report was given to Jabil CircuitJoan RN.

## 2016-09-08 NOTE — Anesthesia Preprocedure Evaluation (Addendum)
Anesthesia Evaluation  Patient identified by MRN, date of birth, ID band Patient awake    Reviewed: Allergy & Precautions, NPO status , Patient's Chart, lab work & pertinent test results  History of Anesthesia Complications Negative for: history of anesthetic complications  Airway Mallampati: II  TM Distance: >3 FB Neck ROM: Full    Dental  (+) Teeth Intact, Dental Advisory Given   Pulmonary Current Smoker,     + decreased breath sounds      Cardiovascular hypertension, Pt. on medications  Rhythm:Regular Rate:Normal     Neuro/Psych PSYCHIATRIC DISORDERS Anxiety Depression    GI/Hepatic Neg liver ROS, GERD  Medicated and Controlled,  Endo/Other  Hypothyroidism Morbid obesity  Renal/GU negative Renal ROS     Musculoskeletal   Abdominal   Peds  Hematology   Anesthesia Other Findings   Reproductive/Obstetrics                             Anesthesia Physical Anesthesia Plan  ASA: III  Anesthesia Plan: General   Post-op Pain Management:    Induction: Intravenous  Airway Management Planned: Oral ETT  Additional Equipment:   Intra-op Plan:   Post-operative Plan: Extubation in OR  Informed Consent: I have reviewed the patients History and Physical, chart, labs and discussed the procedure including the risks, benefits and alternatives for the proposed anesthesia with the patient or authorized representative who has indicated his/her understanding and acceptance.   Dental advisory given  Plan Discussed with: CRNA and Anesthesiologist  Anesthesia Plan Comments:         Anesthesia Quick Evaluation

## 2016-09-08 NOTE — Interval H&P Note (Signed)
History and Physical Interval Note:  09/08/2016 10:05 AM  Amber Colon  has presented today for surgery, with the diagnosis of Cholecystitis with cholelithiasis  The various methods of treatment have been discussed with the patient and family. After consideration of risks, benefits and other options for treatment, the patient has consented to  Procedure(s): LAPAROSCOPIC CHOLECYSTECTOMY WITH INTRAOPERATIVE CHOLANGIOGRAM (N/A) as a surgical intervention .  The patient's history has been reviewed, patient examined, no change in status, stable for surgery.  I have reviewed the patient's chart and labs.  Questions were answered to the patient's satisfaction.     TOTH III,PAUL S

## 2016-09-09 ENCOUNTER — Encounter (HOSPITAL_COMMUNITY): Payer: Self-pay | Admitting: General Surgery

## 2016-09-09 DIAGNOSIS — K8010 Calculus of gallbladder with chronic cholecystitis without obstruction: Secondary | ICD-10-CM | POA: Diagnosis not present

## 2016-09-09 MED ORDER — HYDROCODONE-ACETAMINOPHEN 5-325 MG PO TABS: 1.0000 | ORAL_TABLET | ORAL | 0 refills | Status: DC | PRN

## 2016-09-09 NOTE — Progress Notes (Signed)
Pt. D/c'd successfully. 

## 2016-09-09 NOTE — Progress Notes (Signed)
Pt. Has potassium level of 2.9 from 09/08/2016. Per MD she is okay to be discharged. D/c papers gone over with patient. Pain prescription given to pt. No questions/complaints from pt. IV taken out.

## 2016-09-09 NOTE — Progress Notes (Signed)
1 Day Post-Op  Subjective: Complains only of soreness at umbilicus and right shoulder.  Tolerating diet  Objective: Vital signs in last 24 hours: Temp:  [97.5 F (36.4 C)-98.9 F (37.2 C)] 98.1 F (36.7 C) (09/21 0134) Pulse Rate:  [55-82] 55 (09/21 0134) Resp:  [16-24] 18 (09/21 0134) BP: (110-144)/(63-88) 114/76 (09/21 0134) SpO2:  [96 %-100 %] 98 % (09/21 0134) Weight:  [155.5 kg (342 lb 13 oz)-156.5 kg (345 lb)] 155.5 kg (342 lb 13 oz) (09/20 1400)    Intake/Output from previous day: 09/20 0701 - 09/21 0700 In: 3844.5 [P.O.:1062; I.V.:2782.5] Out: 1930 [Urine:1900; Blood:30] Intake/Output this shift: No intake/output data recorded.  Resp: clear to auscultation bilaterally Cardio: regular rate and rhythm GI: soft, minimal tenderness. incisions ok  Lab Results:   Recent Labs  09/08/16 0900  WBC 6.7  HGB 13.1  HCT 40.8  PLT 219   BMET  Recent Labs  09/08/16 0900  NA 137  K 2.9*  CL 100*  CO2 26  GLUCOSE 116*  BUN 7  CREATININE 1.03*  CALCIUM 9.4   PT/INR No results for input(s): LABPROT, INR in the last 72 hours. ABG No results for input(s): PHART, HCO3 in the last 72 hours.  Invalid input(s): PCO2, PO2  Studies/Results: Dg Cholangiogram Operative  Result Date: 09/08/2016 CLINICAL DATA:  Cholelithiasis.  Laparoscopic cholecystectomy. EXAM: INTRAOPERATIVE CHOLANGIOGRAM TECHNIQUE: Cholangiographic images from the C-arm fluoroscopic device were submitted for interpretation post-operatively. Please see the procedural report for the amount of contrast and the fluoroscopy time utilized. COMPARISON:  Ultrasound 08/24/2016 FINDINGS: Opacification of the biliary system without filling defects or stones. Contrast drains into the duodenum. Biliary system is decompressed. IMPRESSION: Patent biliary system. Electronically Signed   By: Richarda OverlieAdam  Henn M.D.   On: 09/08/2016 11:18    Anti-infectives: Anti-infectives    Start     Dose/Rate Route Frequency Ordered Stop   09/08/16 0945  ciprofloxacin (CIPRO) IVPB 400 mg     400 mg 200 mL/hr over 60 Minutes Intravenous To [facility]Stay Surgical 09/07/16 0855 09/08/16 1025      Assessment/Plan: s/p Procedure(s): LAPAROSCOPIC CHOLECYSTECTOMY WITH INTRAOPERATIVE CHOLANGIOGRAM (N/A) Advance diet Discharge  LOS: 0 days    TOTH III,PAUL S 09/09/2016

## 2016-09-09 NOTE — Discharge Summary (Signed)
Physician Discharge Summary  Patient ID: Amber Colon MRN: [id number] DOB/AGE: [birth year] 34 y.o.  Admit date: 09/08/2016 Discharge date: 09/09/2016  Admission Diagnoses:  Discharge Diagnoses:  Active Problems:   Cholecystitis with cholelithiasis   Discharged Condition: good  Hospital Course: the patient underwent lap chole. She lives alone and therefore stayed overnight in hospital. She tolerated surgery well. On pod 1 she was ready for discharge home  Consults: None  Significant Diagnostic Studies: none  Treatments: surgery: as above  Discharge Exam: Blood pressure 114/76, pulse (!) 55, temperature 98.1 F (36.7 C), temperature source Oral, resp. rate 18, height 5\' 6"  (1.676 m), weight (!) 155.5 kg (342 lb 13 oz), last menstrual period 08/30/2016, SpO2 98 %. Resp: clear to auscultation bilaterally Cardio: regular rate and rhythm GI: soft, minimal tenderness  Disposition: 01-Home or Self Care  Discharge Instructions    Call MD for:  difficulty breathing, headache or visual disturbances    Complete by:  As directed    Call MD for:  extreme fatigue    Complete by:  As directed    Call MD for:  hives    Complete by:  As directed    Call MD for:  persistant dizziness or light-headedness    Complete by:  As directed    Call MD for:  persistant nausea and vomiting    Complete by:  As directed    Call MD for:  redness, tenderness, or signs of infection (pain, swelling, redness, odor or green/yellow discharge around incision site)    Complete by:  As directed    Call MD for:  severe uncontrolled pain    Complete by:  As directed    Call MD for:  temperature >100.4    Complete by:  As directed    Diet - low sodium heart healthy    Complete by:  As directed    Discharge instructions    Complete by:  As directed    May shower. Low fat diet. No heavy lifting   Increase activity slowly    Complete by:  As directed    No wound care    Complete by:  As directed         Medication List    TAKE these medications   acetaminophen 500 MG tablet Commonly known as:  TYLENOL Take 1,000 mg by mouth every 6 (six) hours as needed.   citalopram 20 MG tablet Commonly known as:  CELEXA Take 1 tablet (20 mg total) by mouth daily.   dicyclomine 20 MG tablet Commonly known as:  BENTYL Take 1 tablet (20 mg total) by mouth every 6 (six) hours.   ferrous sulfate 325 (65 FE) MG tablet Take 325 mg by mouth daily with breakfast.   hydrochlorothiazide 25 MG tablet Commonly known as:  HYDRODIURIL Take 1 tablet (25 mg total) by mouth daily.   HYDROcodone-acetaminophen 5-325 MG tablet Commonly known as:  NORCO/VICODIN Take 1-2 tablets by mouth every 4 (four) hours as needed for moderate pain.   levothyroxine 75 MCG tablet Commonly known as:  SYNTHROID, LEVOTHROID Take 1 tablet (75 mcg total) by mouth daily.   naproxen sodium 220 MG tablet Commonly known as:  ANAPROX Take 220 mg by mouth daily as needed.   omeprazole 20 MG capsule Commonly known as:  PRILOSEC Take 1 capsule (20 mg total) by mouth 2 (two) times daily before a meal.   traZODone 50 MG tablet Commonly known as:  DESYREL Take 0.5-1 tablets (25-50 mg total)  by mouth at bedtime as needed for sleep.      Follow-up Information    TOTH III,Natali Lavallee S, MD Follow up in 2 week(s).   Specialty:  General Surgery Contact information: [address] [place] [id number] [phone]           Signed: Robyne AskewOTH III,Trenika Hudson S 09/09/2016, 7:47 AM

## 2016-09-23 ENCOUNTER — Encounter: Payer: Self-pay | Admitting: Pediatrics

## 2016-09-23 ENCOUNTER — Ambulatory Visit (INDEPENDENT_AMBULATORY_CARE_PROVIDER_SITE_OTHER): Payer: Medicaid Other | Admitting: Pediatrics

## 2016-09-23 VITALS — BP 127/88 | HR 84 | Temp 97.7°F | Ht 66.0 in | Wt 339.0 lb

## 2016-09-23 DIAGNOSIS — F411 Generalized anxiety disorder: Secondary | ICD-10-CM | POA: Diagnosis not present

## 2016-09-23 DIAGNOSIS — R739 Hyperglycemia, unspecified: Secondary | ICD-10-CM | POA: Diagnosis not present

## 2016-09-23 DIAGNOSIS — E876 Hypokalemia: Secondary | ICD-10-CM | POA: Diagnosis not present

## 2016-09-23 DIAGNOSIS — I10 Essential (primary) hypertension: Secondary | ICD-10-CM | POA: Diagnosis not present

## 2016-09-23 DIAGNOSIS — Z9049 Acquired absence of other specified parts of digestive tract: Secondary | ICD-10-CM | POA: Diagnosis not present

## 2016-09-23 DIAGNOSIS — Z6841 Body Mass Index (BMI) 40.0 and over, adult: Secondary | ICD-10-CM | POA: Diagnosis not present

## 2016-09-23 DIAGNOSIS — Z23 Encounter for immunization: Secondary | ICD-10-CM | POA: Diagnosis not present

## 2016-09-23 LAB — BAYER DCA HB A1C WAIVED: HB A1C (BAYER DCA - WAIVED): 5.2 %

## 2016-09-23 MED ORDER — HYDROCODONE-ACETAMINOPHEN 5-325 MG PO TABS: 1.0000 | ORAL_TABLET | Freq: Two times a day (BID) | ORAL | 0 refills | Status: DC | PRN

## 2016-09-23 NOTE — Progress Notes (Signed)
  Subjective:   Patient ID: Amber Colon, female    DOB: 17-Oct-1981, 35 y.o.   MRN: [id number] CC: Follow-up (6 week)  HPI: Amber Colon is a 35 y.o. female presenting for Follow-up (6 week)  S/p cholescystectomy: taking 2 vicodin at a time hasnt taken in past 3 days Feeling better Does help with sharp pains Has 2 pills left  abd pain much better since surgery Has been having regular stools  hasnt started citalopram or trazodone Still sleeping poorly A couple of times has woken up coughing, not able to breathe, dreaming of drowning Does not snore  Relevant past medical, surgical, family and social history reviewed. Allergies and medications reviewed and updated. History  Smoking Status  . Current Some Day Smoker  . Packs/day: 0.25  . Years: 2.00  Smokeless Tobacco  . Never Used    Comment: 2-3 cigfs a day   ROS: Per HPI   Objective:    BP 127/88   Pulse 84   Temp 97.7 F (36.5 C) (Oral)   Ht '5\' 6"'$  (1.676 m)   Wt (!) 339 lb (153.8 kg)   LMP 08/30/2016   BMI 54.72 kg/m   Wt Readings from Last 3 Encounters:  09/23/16 (!) 339 lb (153.8 kg)  09/08/16 (!) 342 lb 13 oz (155.5 kg)  08/12/16 (!) 341 lb 12.8 oz (155 kg)   Gen: NAD, alert, cooperative with exam, NCAT EYES: EOMI, no conjunctival injection, or no icterus CV: NRRR, normal S1/S2, no murmur, distal pulses 2+ b/l Resp: CTABL, no wheezes, normal WOB Abd: +BS, soft, NT with gentle palpation, ND. no guarding or organomegaly Ext: No edema, warm Neuro: Alert and oriented, strength equal b/l UE and LE, coordination grossly normal MSK: normal muscle bulk Skin: scars from recent surgery healing well, several apprx 1 cm well-approximated lacerations over abdomen. Some yellow-purple bruising present around laceration below umbilicus Psych: mood is "good", normal affect  Assessment & Plan:  Amber Colon was seen today for follow-up med problems.  Diagnoses and all orders for this visit:  Essential hypertension  adequate control today Cont current meds  Hyperglycemia -     Bayer DCA Hb A1c Waived  Hypokalemia -     BMP8+EGFR  S/P cholecystectomy Gave one additional Rx for #10 tabs, take only as needed for lingering pain following surgery. -     HYDROcodone-acetaminophen (NORCO/VICODIN) 5-325 MG tablet; Take 1-2 tablets by mouth every 12 (twelve) hours as needed for moderate pain.  GAD (generalized anxiety disorder) Start citalopram  Morbid obesity with BMI of 50.0-59.9, adult [facility]) Planning on being more active Discussed lifestyle changes  Follow up plan: Return in about 3 months (around 12/24/2016) for 3 mo med f/u. Assunta Found, MD [place]

## 2016-09-24 LAB — BMP8+EGFR
BUN/Creatinine Ratio: 12 (ref 9–23)
BUN: 11 mg/dL (ref 6–20)
CALCIUM: 10 mg/dL (ref 8.7–10.2)
CO2: 27 mmol/L (ref 18–29)
CREATININE: 0.95 mg/dL (ref 0.57–1.00)
Chloride: 97 mmol/L (ref 96–106)
GFR calc Af Amer: 90 mL/min/{1.73_m2} (ref >59)
GFR calc non Af Amer: 78 mL/min/{1.73_m2} (ref >59)
GLUCOSE: 104 mg/dL — AB (ref 65–99)
POTASSIUM: 3.7 mmol/L (ref 3.5–5.2)
SODIUM: 142 mmol/L (ref 134–144)

## 2016-10-11 ENCOUNTER — Ambulatory Visit (INDEPENDENT_AMBULATORY_CARE_PROVIDER_SITE_OTHER): Payer: Medicaid Other | Admitting: Otolaryngology

## 2016-10-25 ENCOUNTER — Ambulatory Visit (INDEPENDENT_AMBULATORY_CARE_PROVIDER_SITE_OTHER): Payer: Medicaid Other | Admitting: Otolaryngology

## 2016-11-08 ENCOUNTER — Ambulatory Visit (INDEPENDENT_AMBULATORY_CARE_PROVIDER_SITE_OTHER): Payer: Medicaid Other | Admitting: Otolaryngology

## 2016-11-08 DIAGNOSIS — H95123 Granulation of postmastoidectomy cavity, bilateral ears: Secondary | ICD-10-CM

## 2016-11-17 ENCOUNTER — Other Ambulatory Visit: Payer: Self-pay | Admitting: Family

## 2016-11-17 DIAGNOSIS — K219 Gastro-esophageal reflux disease without esophagitis: Secondary | ICD-10-CM

## 2016-12-24 ENCOUNTER — Encounter: Payer: Self-pay | Admitting: Pediatrics

## 2016-12-24 ENCOUNTER — Ambulatory Visit (INDEPENDENT_AMBULATORY_CARE_PROVIDER_SITE_OTHER): Payer: Medicaid Other | Admitting: Pediatrics

## 2016-12-24 VITALS — BP 121/89 | HR 75 | Temp 97.6°F | Ht 66.0 in | Wt 343.0 lb

## 2016-12-24 DIAGNOSIS — I10 Essential (primary) hypertension: Secondary | ICD-10-CM | POA: Diagnosis not present

## 2016-12-24 DIAGNOSIS — R42 Dizziness and giddiness: Secondary | ICD-10-CM

## 2016-12-24 DIAGNOSIS — F329 Major depressive disorder, single episode, unspecified: Secondary | ICD-10-CM

## 2016-12-24 DIAGNOSIS — F32A Depression, unspecified: Secondary | ICD-10-CM | POA: Insufficient documentation

## 2016-12-24 DIAGNOSIS — Z9049 Acquired absence of other specified parts of digestive tract: Secondary | ICD-10-CM

## 2016-12-24 DIAGNOSIS — R197 Diarrhea, unspecified: Secondary | ICD-10-CM | POA: Diagnosis not present

## 2016-12-24 MED ORDER — COLESTIPOL HCL 1 G PO TABS: 2.0000 g | ORAL_TABLET | Freq: Two times a day (BID) | ORAL | 1 refills | Status: DC

## 2016-12-24 NOTE — Progress Notes (Signed)
Subjective:   Patient ID: Amber Colon, female    DOB: 08/18/1981, 36 y.o.   MRN: [id number] CC: Follow-up (3 months); Diarrhea (after gallbladder surgery in september); and Dizziness  HPI: Amber Colon is a 36 y.o. female presenting for Follow-up (3 months); Diarrhea (after gallbladder surgery in september); and Dizziness  Diarrhea happens most of the time after she eats Constipated until she eats then has diarrhea, sometimes ifrst part of stool hard then loose afterwards No accidents Occasional abd pain Had gallbladder removal two months ago Missed follow up appt No N/V No fevers  Has had two episodes of dizziness Happened randomly while sitting down, felt like she just got off a carnival ride, also felt like she was about passing  One time happened while sitting on toilet, having some abd pain The other time she was sitting on the sofa, lasted for a few seconds, had heard something pop in her   Mood has been up and down Just found out husband not coming back anytime soon, 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 Feels safe at home Snapping at kids often, irritable No thoughts of self harm Has not started celexa  Depression screen [facility] 2/9 12/24/2016 09/23/2016 08/12/2016 06/30/2016 01/22/2016  Decreased Interest 1 0 1 3 0  Down, Depressed, Hopeless 3 0 1 3 0  PHQ - 2 Score 4 0 2 6 0  Altered sleeping 3 0 3 3 -  Tired, decreased energy 3 - 2 3 -  Change in appetite 3 - 2 2 -  Feeling bad or failure about yourself  3 - 1 3 -  Trouble concentrating 2 - 1 2 -  Moving slowly or fidgety/restless 1 - 1 1 -  Suicidal thoughts 0 - 1 1 -  PHQ-9 Score 19 0 13 21 -  Difficult doing work/chores Very difficult - Somewhat difficult Somewhat difficult -     Relevant past medical, surgical, family and social history reviewed. Allergies and medications reviewed and updated. History  Smoking Status  . Current Some Day Smoker  . Packs/day: 0.25  . Years: 2.00  Smokeless Tobacco  . Never Used   Comment: 2-3 cigfs a day   ROS: Per HPI   Objective:    BP 121/89   Pulse 75   Temp 97.6 F (36.4 C) (Oral)   Ht 5\' 6"  (1.676 m)   Wt (!) 343 lb (155.6 kg)   BMI 55.36 kg/m   Wt Readings from Last 3 Encounters:  12/24/16 (!) 343 lb (155.6 kg)  09/23/16 (!) 339 lb (153.8 kg)  09/08/16 (!) 342 lb 13 oz (155.5 kg)    Gen: NAD, alert, cooperative with exam, NCAT EYES: EOMI, no conjunctival injection, or no icterus ENT: nl TM BL,  OP without erythema LYMPH: no cervical LAD CV: NRRR, normal S1/S2, no murmur, distal pulses 2+ b/l Resp: CTABL, no wheezes, normal WOB Abd: +BS, soft, mildly tender with palpation RUQ, epigastric area, no guarding, non-distended.  Ext: No edema, warm Neuro: Alert and oriented, strength equal b/l UE and LE, coordination grossly normal Psych: nl affect, mood "irritable" no thougths of self harm  Assessment & Plan:  [place] was seen today for follow-up, diarrhea and dizziness.  Diagnoses and all orders for this visit:  Postcholecystectomy diarrhea Start below If having constipation stop and let me know -     colestipol (COLESTID) 1 g tablet; Take 2 tablets (2 g total) by mouth 2 (two) times daily.  Essential hypertension Adequate control today Cont HCTZ  Depression, unspecified depression type Ongoing symptoms Start citalopram F/u in 2 weeks, sooner I fneeded Pt has not been on before but concerned will make mood worse Gave list of counselors, call to set up appt  Dizziness Two episodes, lasted for seconds, no obvious triggers Let me know if happens again  Follow up plan: Return in about 2 weeks (around 01/07/2017). Rex Kras, MD [place] [facility]

## 2016-12-24 NOTE — Patient Instructions (Addendum)
Take citalopram half tab for 2 weeks Come back and see me in two weeks  Start colestipol tabs, take 1 tab morning and night for a few days then can take 2 tabs morning and night to help with diarrhea  [email or web address]  Your provider wants you to schedule an appointment with a Psychologist/Psychiatrist. The following list of offices requires the patient to call and make their own appointment, as there is information they need that only you can provide. Please feel free to choose form the following providers:  [facility]   [phone] Crisis Recovery in [place] [phone]  [facility]  [phone]   [address] [place], [place]  (Scheduled through [facility]) Must call and do an interview for appointment. Sees Children / Accepts Medicaid  [facility]    [phone]  [address]    [place], [place]       [facility]  [phone] [address] [place], [place]  Evaluates for Autism but does not treat it Sees Children / Accepts Medicaid  [facility]    [phone] [address], [address]   [place], [place] Medication management, substance abuse, bipolar, grief, family, marriage, OCD, anxiety, PTSD Sees children / Accepts Medicaid  [facility]    [phone] [address] [place] Sees children / [facility]s [facility]  [facility]  [phone] [address] [place], [place]   Dr Estelle GrumblesAkinlayo     [phone] [address] [place], [place]  Sees ADD & ADHD for treatment [facility]  [facility]  [phone] [address] [place], [place] Evaluates for Autism Accepts Medicaid   [facility]   [phone] [address]   [place], [place] Uses animal therapy  Sees children as young as 36 years old Accepts Medicaid

## 2017-01-12 ENCOUNTER — Ambulatory Visit: Payer: Medicaid Other | Admitting: Pediatrics

## 2017-01-13 ENCOUNTER — Encounter: Payer: Self-pay | Admitting: Pediatrics

## 2017-01-19 ENCOUNTER — Other Ambulatory Visit: Payer: Self-pay | Admitting: Pediatrics

## 2017-01-19 DIAGNOSIS — E039 Hypothyroidism, unspecified: Secondary | ICD-10-CM

## 2017-01-26 ENCOUNTER — Ambulatory Visit: Payer: Medicaid Other | Admitting: Pediatrics

## 2017-01-27 ENCOUNTER — Encounter: Payer: Self-pay | Admitting: Pediatrics

## 2017-01-28 ENCOUNTER — Ambulatory Visit: Payer: Medicaid Other | Admitting: Pediatrics

## 2017-02-01 ENCOUNTER — Encounter: Payer: Self-pay | Admitting: Pediatrics

## 2017-02-02 ENCOUNTER — Ambulatory Visit (INDEPENDENT_AMBULATORY_CARE_PROVIDER_SITE_OTHER): Payer: Medicaid Other | Admitting: Pediatrics

## 2017-02-02 ENCOUNTER — Encounter: Payer: Self-pay | Admitting: Pediatrics

## 2017-02-02 VITALS — BP 132/89 | HR 91 | Temp 98.1°F | Ht 66.0 in | Wt 345.0 lb

## 2017-02-02 DIAGNOSIS — Z720 Tobacco use: Secondary | ICD-10-CM | POA: Diagnosis not present

## 2017-02-02 DIAGNOSIS — F329 Major depressive disorder, single episode, unspecified: Secondary | ICD-10-CM

## 2017-02-02 DIAGNOSIS — R0683 Snoring: Secondary | ICD-10-CM | POA: Diagnosis not present

## 2017-02-02 DIAGNOSIS — F32A Depression, unspecified: Secondary | ICD-10-CM

## 2017-02-02 MED ORDER — BUPROPION HCL ER (SR) 150 MG PO TB12: 150.0000 mg | ORAL_TABLET | Freq: Two times a day (BID) | ORAL | 2 refills | Status: DC

## 2017-02-02 MED ORDER — CITALOPRAM HYDROBROMIDE 20 MG PO TABS: 20.0000 mg | ORAL_TABLET | Freq: Every day | ORAL | 3 refills | Status: DC

## 2017-02-02 NOTE — Patient Instructions (Signed)
[email or web address]  Your provider wants you to schedule an appointment with a Psychologist/Psychiatrist. The following list of offices requires the patient to call and make their own appointment, as there is information they need that only you can provide. Please feel free to choose form the following providers:  [facility]   [phone] Crisis Recovery in [place] [phone]  [facility]  [phone]   [address] [place], [place]  (Scheduled through [facility]) Must call and do an interview for appointment. Sees Children / Accepts Medicaid  [facility]    [phone]  [address]    [place], [place]       [facility]    [phone] [address], [address]   [place], [place] Medication management, substance abuse, bipolar, grief, family, marriage, OCD, anxiety, PTSD Sees children / Accepts Medicaid  [facility]    [phone] [address] [place] Sees children / Accepts Medicaid  Dr Estelle GrumblesAkinlayo     [phone] [place], [place]  Sees ADD & ADHD for treatment [facility]s [facility]  [facility]  [phone] [address] [place], [place] Evaluates for Autism Accepts Medicaid  [facility]   [phone]   [place], [place] Uses animal therapy  Sees children as young as 272 years old [facility]s [facility]  [facility]     [phone]    [address]  [place], [place] [phone] Sees children Accepts Medicaid

## 2017-02-02 NOTE — Progress Notes (Addendum)
  Subjective:   Patient ID: Amber Colon, female    DOB: 04/18/1981, 36 y.o.   MRN: [id number] CC: Follow-up (2 week)  HPI: Amber Colon is a 36 y.o. female presenting for Follow-up (2 week)  Has been feeling ok Medicine makes her mood better, she does think is helping Sometimes feels more withdrawn in some ways, gets angry easier, snaps at family, increasing stress related to finances, home situation with husband overseas Fights with him at every conservation past two weeks No thoughts of self harm Smokes daily Not ready to quit, helps with stress relief Trying to lose weight Does snore every night Daughter with her in clinic says sometimes she does stop breathing She regulalry wakes herself up coughing/choking  Relevant past medical, surgical, family and social history reviewed. Allergies and medications reviewed and updated. History  Smoking Status  . Current Some Day Smoker  . Packs/day: 0.25  . Years: 2.00  Smokeless Tobacco  . Never Used    Comment: 2-3 cigfs a day   ROS: Per HPI   Objective:    BP 132/89   Pulse 91   Temp 98.1 F (36.7 C) (Oral)   Ht 5\' 6"  (1.676 m)   Wt (!) 345 lb (156.5 kg)   BMI 55.68 kg/m   Wt Readings from Last 3 Encounters:  02/02/17 (!) 345 lb (156.5 kg)  12/24/16 (!) 343 lb (155.6 kg)  09/23/16 (!) 339 lb (153.8 kg)    Gen: NAD, alert, cooperative with exam, NCAT EYES: EOMI, no conjunctival injection, or no icterus CV: NRRR, normal S1/S2, no murmur Resp: CTABL, no wheezes, normal WOB Abd: +BS, soft Neuro: Alert and oriented Psych: nl affect, smiling, mood "better"  Assessment & Plan:  Amber Colon was seen today for follow-up depression  Diagnoses and all orders for this visit:  Snoring Heavy snoring, pauses BMI 55 Has bene having hard time losing weight -     Ambulatory referral to Neurology  Depression, unspecified depression type Improved in some ways, worse in others Feels safe at home Cont celexa, start  wellbutrin Return precautions discussed List for counselors given Pt very open to counseling, list of counselors given -     citalopram (CELEXA) 20 MG tablet; Take 1 tablet (20 mg total) by mouth daily. -     buPROPion (WELLBUTRIN SR) 150 MG 12 hr tablet; Take 1 tablet (150 mg total) by mouth 2 (two) times daily.  Tobacco use Cont to encourage cessation  Follow up plan: Return in about 4 weeks (around 03/02/2017).  Sooner if needed Rex Krasarol Ortha Metts, MD [facility];l

## 2017-02-04 NOTE — Addendum Note (Signed)
Addended by: Johna SheriffVINCENT, Gloriann Riede L on: 02/04/2017 08:41 PM   Modules accepted: Level of Service

## 2017-02-10 ENCOUNTER — Ambulatory Visit (INDEPENDENT_AMBULATORY_CARE_PROVIDER_SITE_OTHER): Payer: Medicaid Other | Admitting: Otolaryngology

## 2017-02-14 ENCOUNTER — Telehealth: Payer: Self-pay | Admitting: Pediatrics

## 2017-02-14 DIAGNOSIS — F32A Depression, unspecified: Secondary | ICD-10-CM

## 2017-02-14 DIAGNOSIS — K219 Gastro-esophageal reflux disease without esophagitis: Secondary | ICD-10-CM

## 2017-02-14 DIAGNOSIS — F329 Major depressive disorder, single episode, unspecified: Secondary | ICD-10-CM

## 2017-02-14 DIAGNOSIS — E039 Hypothyroidism, unspecified: Secondary | ICD-10-CM

## 2017-02-14 MED ORDER — TRAZODONE HCL 50 MG PO TABS: 25.0000 mg | ORAL_TABLET | Freq: Every evening | ORAL | 2 refills | Status: DC | PRN

## 2017-02-14 MED ORDER — CITALOPRAM HYDROBROMIDE 20 MG PO TABS: 20.0000 mg | ORAL_TABLET | Freq: Every day | ORAL | 3 refills | Status: DC

## 2017-02-14 MED ORDER — BUPROPION HCL ER (SR) 150 MG PO TB12: 150.0000 mg | ORAL_TABLET | Freq: Two times a day (BID) | ORAL | 2 refills | Status: DC

## 2017-02-14 MED ORDER — OMEPRAZOLE 20 MG PO CPDR: DELAYED_RELEASE_CAPSULE | ORAL | 4 refills | Status: DC

## 2017-02-14 MED ORDER — LEVOTHYROXINE SODIUM 75 MCG PO TABS: 75.0000 ug | ORAL_TABLET | Freq: Every day | ORAL | 6 refills | Status: DC

## 2017-02-14 MED ORDER — HYDROCHLOROTHIAZIDE 25 MG PO TABS: 25.0000 mg | ORAL_TABLET | Freq: Every day | ORAL | 3 refills | Status: DC

## 2017-02-14 NOTE — Telephone Encounter (Signed)
lmtcb

## 2017-02-15 ENCOUNTER — Other Ambulatory Visit: Payer: Self-pay | Admitting: Pediatrics

## 2017-02-15 NOTE — Telephone Encounter (Signed)
Pt notified of RX Verbalizes understanding 

## 2017-02-16 ENCOUNTER — Telehealth: Payer: Self-pay | Admitting: Pediatrics

## 2017-02-16 DIAGNOSIS — R197 Diarrhea, unspecified: Secondary | ICD-10-CM

## 2017-02-16 DIAGNOSIS — Z9049 Acquired absence of other specified parts of digestive tract: Principal | ICD-10-CM

## 2017-02-16 MED ORDER — COLESTIPOL HCL 1 G PO TABS: 2.0000 g | ORAL_TABLET | Freq: Two times a day (BID) | ORAL | 1 refills | Status: DC

## 2017-02-16 NOTE — Telephone Encounter (Signed)
What is the name of the medication? colcolestipol (COLESTID) 1 g tabletestipol (COLESTID) 1 g tablet  Have you contacted your pharmacy to request a refill? Pt was seen a few days ago wrong rx was called in   Which pharmacy [facility] you [facility] sent [facility]    Patient notified that their request is being sent to the clinical staff for review and that they should receive a call once it is complete. If they do not receive a call within 24 hours they can check with their pharmacy or our office.

## 2017-02-17 ENCOUNTER — Ambulatory Visit (INDEPENDENT_AMBULATORY_CARE_PROVIDER_SITE_OTHER): Payer: Medicaid Other | Admitting: Otolaryngology

## 2017-02-17 DIAGNOSIS — H95122 Granulation of postmastoidectomy cavity, left ear: Secondary | ICD-10-CM | POA: Diagnosis not present

## 2017-02-17 DIAGNOSIS — H95121 Granulation of postmastoidectomy cavity, right ear: Secondary | ICD-10-CM

## 2017-02-17 DIAGNOSIS — H906 Mixed conductive and sensorineural hearing loss, bilateral: Secondary | ICD-10-CM | POA: Diagnosis not present

## 2017-02-24 ENCOUNTER — Ambulatory Visit (INDEPENDENT_AMBULATORY_CARE_PROVIDER_SITE_OTHER): Payer: Medicaid Other | Admitting: Family Medicine

## 2017-02-24 ENCOUNTER — Encounter: Payer: Self-pay | Admitting: Family Medicine

## 2017-02-24 VITALS — BP 130/84 | HR 88 | Temp 97.5°F | Ht 66.0 in | Wt 339.5 lb

## 2017-02-24 DIAGNOSIS — J209 Acute bronchitis, unspecified: Secondary | ICD-10-CM | POA: Diagnosis not present

## 2017-02-24 DIAGNOSIS — Z716 Tobacco abuse counseling: Secondary | ICD-10-CM | POA: Diagnosis not present

## 2017-02-24 DIAGNOSIS — Z720 Tobacco use: Secondary | ICD-10-CM | POA: Diagnosis not present

## 2017-02-24 MED ORDER — BUPROPION HCL ER (SR) 150 MG PO TB12: 150.0000 mg | ORAL_TABLET | Freq: Two times a day (BID) | ORAL | 2 refills | Status: DC

## 2017-02-24 MED ORDER — AZITHROMYCIN 250 MG PO TABS: ORAL_TABLET | ORAL | 0 refills | Status: DC

## 2017-02-24 NOTE — Progress Notes (Signed)
BP 130/84   Pulse 88   Temp 97.5 F (36.4 C) (Oral)   Ht 5\' 6"  (1.676 m)   Wt (!) 339 lb 8 oz (154 kg)   LMP 02/10/2017 (Approximate)   BMI 54.80 kg/m    Subjective:    Patient ID: Amber Colon, female    DOB: 1981/10/07, 36 y.o.   MRN: [id number]  HPI: Amber Colon is a 36 y.o. female presenting on 02/24/2017 for Chest congestion, cough (began 2 days ago, using Mucinex, Robitussin)   HPI Cough and chest congestion Patient has been having cough and chest congestion that been going on for the past 2 days. She's been using Mucinex and Robitussin. She does not feel like they're helping much. She denies any fevers or chills or shortness of breath or wheezing. She does have some sore throat and postnasal drainage and congestion going on with that. She denies any shortness of breath or wheezing. She denies any chest pain.  Relevant past medical, surgical, family and social history reviewed and updated as indicated. Interim medical history since our last visit reviewed. Allergies and medications reviewed and updated.  Review of Systems  Constitutional: Negative for chills and fever.  HENT: Positive for congestion, postnasal drip, rhinorrhea, sinus pressure and sore throat. Negative for ear discharge, ear pain and sneezing.   Eyes: Negative for pain, redness and visual disturbance.  Respiratory: Positive for cough. Negative for chest tightness, shortness of breath and wheezing.   Cardiovascular: Negative for chest pain and leg swelling.  Genitourinary: Negative for difficulty urinating and dysuria.  Musculoskeletal: Negative for back pain and gait problem.  Skin: Negative for rash.  Neurological: Negative for light-headedness and headaches.  Psychiatric/Behavioral: Negative for agitation and behavioral problems.  All other systems reviewed and are negative.   Per HPI unless specifically indicated above     Objective:    BP 130/84   Pulse 88   Temp 97.5 F (36.4 C) (Oral)    Ht 5\' 6"  (1.676 m)   Wt (!) 339 lb 8 oz (154 kg)   LMP 02/10/2017 (Approximate)   BMI 54.80 kg/m   Wt Readings from Last 3 Encounters:  02/24/17 (!) 339 lb 8 oz (154 kg)  02/02/17 (!) 345 lb (156.5 kg)  12/24/16 (!) 343 lb (155.6 kg)    Physical Exam  Constitutional: She is oriented to person, place, and time. She appears well-developed and well-nourished. No distress.  HENT:  Right Ear: Tympanic membrane, external ear and ear canal normal.  Left Ear: Tympanic membrane, external ear and ear canal normal.  Nose: Mucosal edema and rhinorrhea present. No epistaxis. Right sinus exhibits no maxillary sinus tenderness and no frontal sinus tenderness. Left sinus exhibits no maxillary sinus tenderness and no frontal sinus tenderness.  Mouth/Throat: Uvula is midline and mucous membranes are normal. Posterior oropharyngeal edema and posterior oropharyngeal erythema present. No oropharyngeal exudate or tonsillar abscesses.  Eyes: Conjunctivae and EOM are normal.  Neck: Neck supple.  Cardiovascular: Normal rate, regular rhythm, normal heart sounds and intact distal pulses.   No murmur heard. Pulmonary/Chest: Effort normal and breath sounds normal. No respiratory distress. She has no wheezes. She has no rales.  Musculoskeletal: Normal range of motion. She exhibits no edema or tenderness.  Lymphadenopathy:    She has no cervical adenopathy.  Neurological: She is alert and oriented to person, place, and time. Coordination normal.  Skin: Skin is warm and dry. No rash noted. She is not diaphoretic.  Psychiatric: She has  a normal mood and affect. Her behavior is normal.  Nursing note and vitals reviewed.     Assessment & Plan:   Problem List Items Addressed This Visit    None    Visit Diagnoses    Acute bronchitis, unspecified organism    -  Primary   Relevant Medications   azithromycin (ZITHROMAX) 250 MG tablet   Encounter for smoking cessation counseling       Relevant Medications    buPROPion (WELLBUTRIN SR) 150 MG 12 hr tablet       Follow up plan: Return if symptoms worsen or fail to improve.  Counseling provided for all of the vaccine components No orders of the defined types were placed in this encounter.   Arville CareJoshua Nekeisha Aure, MD [facility] 02/24/2017, 1:54 PM

## 2017-03-02 ENCOUNTER — Ambulatory Visit: Payer: Medicaid Other | Admitting: Pediatrics

## 2017-03-03 ENCOUNTER — Telehealth: Payer: Self-pay

## 2017-03-03 ENCOUNTER — Institutional Professional Consult (permissible substitution): Payer: Medicaid Other | Admitting: Neurology

## 2017-03-03 NOTE — Telephone Encounter (Signed)
Patient did not show to appt today  

## 2017-03-08 ENCOUNTER — Ambulatory Visit (INDEPENDENT_AMBULATORY_CARE_PROVIDER_SITE_OTHER): Payer: Medicaid Other | Admitting: Family Medicine

## 2017-03-08 ENCOUNTER — Encounter: Payer: Self-pay | Admitting: Family Medicine

## 2017-03-08 VITALS — BP 115/78 | HR 82 | Temp 98.0°F | Ht 66.0 in | Wt 340.2 lb

## 2017-03-08 DIAGNOSIS — M26629 Arthralgia of temporomandibular joint, unspecified side: Secondary | ICD-10-CM | POA: Diagnosis not present

## 2017-03-08 MED ORDER — IBUPROFEN 600 MG PO TABS: 600.0000 mg | ORAL_TABLET | Freq: Four times a day (QID) | ORAL | 0 refills | Status: DC | PRN

## 2017-03-08 NOTE — Progress Notes (Signed)
   HPI  Patient presents today here to discuss left sided facial pain.  Patient states her symptoms started last night. She has had difficulty with her ear previously with previous ear surgery. Her ears were just checked by ENT about 2 weeks ago.  She states that she had left ear pain yesterday, she's had pain with chewing today. She has mostly left facial pain anterior to the left ear with radiation up to the side of her head and down to her left jaw.  She has also noticed a new click in her left TMJ  Her hearing is reduced but stable for her  PMH: Smoking status noted ROS: Per HPI  Objective: BP 115/78   Pulse 82   Temp 98 F (36.7 C) (Oral)   Ht 5\' 6"  (1.676 m)   Wt (!) 340 lb 3.2 oz (154.3 kg)   LMP 02/10/2017 (Approximate)   BMI 54.91 kg/m  Gen: NAD, alert, cooperative with exam HEENT: NCAT, clicking left TMJ with opening mouth, tenderness to palpation of the pterygoids and over the left TMJ, her left TM and right TM are abnormal, however they're not erythematous, they appear scarred with some bulging tissue above the ossicles, oropharynx clear, no facial tenderness to palpation over the sinuses, nares clear CV: RRR, good S1/S2, no murmur Resp: CTABL, no wheezes, non-labored Ext: No edema, warm Neuro: Alert and oriented, No gross deficits  Assessment and plan:  # TMJ syndrome, left side Treat with NSAIDs, stretching, and heat as needed. Schedule NSAIDs 3 days. Her TMs are abnormal, however I do not see that there is any signs of infection. Patient with anxiety and depression as well, however no SI. She will follow-up with her PCP to discuss this further.     Meds ordered this encounter  Medications  . ibuprofen (ADVIL,MOTRIN) 600 MG tablet    Sig: Take 1 tablet (600 mg total) by mouth every 6 (six) hours as needed.    Dispense:  20 tablet    Refill:  0    Murtis SinkSam Gregary Blackard, MD [facility] 03/08/2017, 11:35 AM

## 2017-03-08 NOTE — Patient Instructions (Signed)
Great to meet you!  I believe your pain today is caused by dysfunction and muscle spasms around your temporomandibular joint (TMJ). Ibuprofen, stretching, and heat applied to the side of the face can be very helpful.  If you try a warm bottle of water or a heating pad do not leave it on for more than 15 minutes. This could cause more inflammation.  Temporomandibular Joint Syndrome Temporomandibular joint (TMJ) syndrome is a condition that affects the joints between your jaw and your skull. The TMJs are located near your ears and allow your jaw to open and close. These joints and the nearby muscles are involved in all movements of the jaw. People with TMJ syndrome have pain in the area of these joints and muscles. Chewing, biting, or other movements of the jaw can be difficult or painful. TMJ syndrome can be caused by various things. In many cases, the condition is mild and goes away within a few weeks. For some people, the condition can become a long-term problem. What are the causes? Possible causes of TMJ syndrome include:  Grinding your teeth or clenching your jaw. Some people do this when they are under stress.  Arthritis.  Injury to the jaw.  Head or neck injury.  Teeth or dentures that are not aligned well. In some cases, the cause of TMJ syndrome may not be known. What are the signs or symptoms? The most common symptom is an aching pain on the side of the head in the area of the TMJ. Other symptoms may include:  Pain when moving your jaw, such as when chewing or biting.  Being unable to open your jaw all the way.  Making a clicking sound when you open your mouth.  Headache.  Earache.  Neck or shoulder pain. How is this diagnosed? Diagnosis can usually be made based on your symptoms, your medical history, and a physical exam. Your health care provider may check the range of motion of your jaw. Imaging tests, such as X-rays or an MRI, are sometimes done. You may need to see  your dentist to determine if your teeth and jaw are lined up correctly. How is this treated? TMJ syndrome often goes away on its own. If treatment is needed, the options may include:  Eating soft foods and applying ice or heat.  Medicines to relieve pain or inflammation.  Medicines to relax the muscles.  A splint, bite plate, or mouthpiece to prevent teeth grinding or jaw clenching.  Relaxation techniques or counseling to help reduce stress.  Transcutaneous electrical nerve stimulation (TENS). This helps to relieve pain by applying an electrical current through the skin.  Acupuncture. This is sometimes helpful to relieve pain.  Jaw surgery. This is rarely needed. Follow these instructions at home:  Take medicines only as directed by your health care provider.  Eat a soft diet if you are having trouble chewing.  Apply ice to the painful area.  Put ice in a plastic bag.  Place a towel between your skin and the bag.  Leave the ice on for 20 minutes, 2-3 times a day.  Apply a warm compress to the painful area as directed.  Massage your jaw area and perform any jaw stretching exercises as recommended by your health care provider.  If you were given a mouthpiece or bite plate, wear it as directed.  Avoid foods that require a lot of chewing. Do not chew gum.  Keep all follow-up visits as directed by your health care provider. This  is important. Contact a health care provider if:  You are having trouble eating.  You have new or worsening symptoms. Get help right away if:  Your jaw locks open or closed. This information is not intended to replace advice given to you by your health care provider. Make sure you discuss any questions you have with your health care provider. Document Released: 08/31/2001 Document Revised: 08/05/2016 Document Reviewed: 07/11/2014 Elsevier Interactive Patient Education  2017 [employer].

## 2017-03-09 ENCOUNTER — Encounter: Payer: Self-pay | Admitting: Neurology

## 2017-03-11 ENCOUNTER — Ambulatory Visit: Payer: Medicaid Other | Admitting: Pediatrics

## 2017-03-17 ENCOUNTER — Ambulatory Visit (INDEPENDENT_AMBULATORY_CARE_PROVIDER_SITE_OTHER): Payer: Medicaid Other | Admitting: Pediatrics

## 2017-03-17 ENCOUNTER — Encounter: Payer: Self-pay | Admitting: Pediatrics

## 2017-03-17 VITALS — BP 131/95 | HR 83 | Temp 97.8°F | Ht 66.0 in | Wt 336.8 lb

## 2017-03-17 DIAGNOSIS — Z6841 Body Mass Index (BMI) 40.0 and over, adult: Secondary | ICD-10-CM

## 2017-03-17 DIAGNOSIS — Z716 Tobacco abuse counseling: Secondary | ICD-10-CM | POA: Diagnosis not present

## 2017-03-17 DIAGNOSIS — F329 Major depressive disorder, single episode, unspecified: Secondary | ICD-10-CM

## 2017-03-17 DIAGNOSIS — R197 Diarrhea, unspecified: Secondary | ICD-10-CM | POA: Diagnosis not present

## 2017-03-17 DIAGNOSIS — F32A Depression, unspecified: Secondary | ICD-10-CM

## 2017-03-17 DIAGNOSIS — Z9049 Acquired absence of other specified parts of digestive tract: Secondary | ICD-10-CM

## 2017-03-17 DIAGNOSIS — K9189 Other postprocedural complications and disorders of digestive system: Secondary | ICD-10-CM

## 2017-03-17 DIAGNOSIS — Z720 Tobacco use: Secondary | ICD-10-CM

## 2017-03-17 MED ORDER — BUPROPION HCL ER (SR) 150 MG PO TB12: 150.0000 mg | ORAL_TABLET | Freq: Two times a day (BID) | ORAL | 2 refills | Status: DC

## 2017-03-17 MED ORDER — CITALOPRAM HYDROBROMIDE 10 MG PO TABS: 20.0000 mg | ORAL_TABLET | Freq: Every day | ORAL | 2 refills | Status: DC

## 2017-03-17 NOTE — Patient Instructions (Signed)
Start 10mg  of citalopram Can increase to 20mg  after a week  Start buproprion/wellbutrin for smoking cessation around 04/03/2017
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
## 2017-04-21 ENCOUNTER — Ambulatory Visit (INDEPENDENT_AMBULATORY_CARE_PROVIDER_SITE_OTHER): Payer: Medicaid Other | Admitting: Otolaryngology

## 2017-04-28 ENCOUNTER — Ambulatory Visit (INDEPENDENT_AMBULATORY_CARE_PROVIDER_SITE_OTHER): Payer: Medicaid Other | Admitting: Otolaryngology

## 2017-05-12 ENCOUNTER — Encounter: Payer: Self-pay | Admitting: Pediatrics

## 2017-05-12 ENCOUNTER — Ambulatory Visit (INDEPENDENT_AMBULATORY_CARE_PROVIDER_SITE_OTHER): Payer: Medicaid Other | Admitting: Otolaryngology

## 2017-05-12 ENCOUNTER — Ambulatory Visit (INDEPENDENT_AMBULATORY_CARE_PROVIDER_SITE_OTHER): Payer: Medicaid Other | Admitting: Pediatrics

## 2017-05-12 VITALS — BP 133/88 | HR 78 | Temp 98.6°F | Ht 66.0 in | Wt 336.2 lb

## 2017-05-12 DIAGNOSIS — F411 Generalized anxiety disorder: Secondary | ICD-10-CM | POA: Diagnosis not present

## 2017-05-12 DIAGNOSIS — M545 Low back pain, unspecified: Secondary | ICD-10-CM

## 2017-05-12 DIAGNOSIS — Z6841 Body Mass Index (BMI) 40.0 and over, adult: Secondary | ICD-10-CM

## 2017-05-12 DIAGNOSIS — H95123 Granulation of postmastoidectomy cavity, bilateral ears: Secondary | ICD-10-CM | POA: Diagnosis not present

## 2017-05-12 DIAGNOSIS — I10 Essential (primary) hypertension: Secondary | ICD-10-CM

## 2017-05-12 DIAGNOSIS — F17200 Nicotine dependence, unspecified, uncomplicated: Secondary | ICD-10-CM

## 2017-05-12 MED ORDER — IBUPROFEN 600 MG PO TABS: 600.0000 mg | ORAL_TABLET | Freq: Four times a day (QID) | ORAL | 0 refills | Status: DC | PRN

## 2017-05-12 MED ORDER — CYCLOBENZAPRINE HCL 10 MG PO TABS: 10.0000 mg | ORAL_TABLET | Freq: Three times a day (TID) | ORAL | 0 refills | Status: DC | PRN

## 2017-05-12 NOTE — Patient Instructions (Addendum)
Back Injury Prevention Back injuries can be very painful. They can also be difficult to heal. After having one back injury, you are more likely to injure your back again. It is important to learn how to avoid injuring or re-injuring your back. The following tips can help you to prevent a back injury. What should I know about physical fitness?  Exercise for 30 minutes per day on most days of the week or as told by your doctor. Make sure to:  Do aerobic exercises, such as walking, jogging, biking, or swimming.  Do exercises that increase balance and strength, such as tai chi and yoga.  Do stretching exercises. This helps with flexibility.  Try to develop strong belly (abdominal) muscles. Your belly muscles help to support your back.  Stay at a healthy weight. This helps to decrease your risk of a back injury. What should I know about my diet?  Talk with your doctor about your overall diet. Take supplements and vitamins only as told by your doctor.  Talk with your doctor about how much calcium and vitamin D you need each day. These nutrients help to prevent weakening of the bones (osteoporosis).  Include good sources of calcium in your diet, such as:  Dairy products.  Green leafy vegetables.  Products that have had calcium added to them (fortified).  Include good sources of vitamin D in your diet, such as:  Milk.  Foods that have had vitamin D added to them. What should I know about my posture?  Sit up straight and stand up straight. Avoid leaning forward when you sit or hunching over when you stand.  Choose chairs that have good low-back (lumbar) support.  If you work at a desk, sit close to it so you do not need to lean over. Keep your chin tucked in. Keep your neck drawn back. Keep your elbows bent so your arms look like the letter "L" (right angle).  Sit high and close to the steering wheel when you drive. Add a low-back support to your car seat, if needed.  Avoid sitting  or standing in one position for very long. Take breaks to get up, stretch, and walk around at least one time every hour. Take breaks every hour if you are driving for long periods of time.  Sleep on your side with your knees slightly bent, or sleep on your back with a pillow under your knees. Do not lie on the front of your body to sleep. What should I know about lifting, twisting, and reaching? Lifting and Heavy Lifting    Avoid heavy lifting, especially lifting over and over again. If you must do heavy lifting:  Stretch before lifting.  Work slowly.  Rest between lifts.  Use a tool such as a cart or a dolly to move objects if one is available.  Make several small trips instead of carrying one heavy load.  Ask for help when you need it, especially when moving big objects.  Follow these steps when lifting:  Stand with your feet shoulder-width apart.  Get as close to the object as you can. Do not pick up a heavy object that is far from your body.  Use handles or lifting straps if they are available.  Bend at your knees. Squat down, but keep your heels off the floor.  Keep your shoulders back. Keep your chin tucked in. Keep your back straight.  Lift the object slowly while you tighten the muscles in your legs, belly, and butt. Keep  the object as close to the center of your body as possible.  Follow these steps when putting down a heavy load:  Stand with your feet shoulder-width apart.  Lower the object slowly while you tighten the muscles in your legs, belly, and butt. Keep the object as close to the center of your body as possible.  Keep your shoulders back. Keep your chin tucked in. Keep your back straight.  Bend at your knees. Squat down, but keep your heels off the floor.  Use handles or lifting straps if they are available. Twisting and Reaching   Avoid lifting heavy objects above your waist.  Do not twist at your waist while you are lifting or carrying a load. If  you need to turn, move your feet.  Do not bend over without bending at your knees.  Avoid reaching over your head, across a table, or for an object on a high surface. What are some other tips?  Avoid wet floors and icy ground. Keep sidewalks clear of ice to prevent falls.  Do not sleep on a mattress that is too soft or too hard.  Keep items that you use often within easy reach.  Put heavier objects on shelves at waist level, and put lighter objects on lower or higher shelves.  Find ways to lower your stress, such as:  Exercise.  Massage.  Relaxation techniques.  Talk with your doctor if you feel anxious or depressed. These conditions can make back pain worse.  Wear flat heel shoes with cushioned soles.  Avoid making quick (sudden) movements.  Use both shoulder straps when carrying a backpack.  Do not use any tobacco products, including cigarettes, chewing tobacco, or electronic cigarettes. If you need help quitting, ask your doctor. This information is not intended to replace advice given to you by your health care provider. Make sure you discuss any questions you have with your health care provider. Document Released: 05/24/2008 Document Revised: 05/13/2016 Document Reviewed: 12/10/2014 Elsevier Interactive Patient Education  2017 [employer].  Back Exercises If you have pain in your back, do these exercises 2-3 times each day or as told by your doctor. When the pain goes away, do the exercises once each day, but repeat the steps more times for each exercise (do more repetitions). If you do not have pain in your back, do these exercises once each day or as told by your doctor. Exercises Single Knee to Chest   Do these steps 3-5 times in a row for each leg: 1. Lie on your back on a firm bed or the floor with your legs stretched out. 2. Bring one knee to your chest. 3. Hold your knee to your chest by grabbing your knee or thigh. 4. Pull on your knee until you feel a  gentle stretch in your lower back. 5. Keep doing the stretch for 10-30 seconds. 6. Slowly let go of your leg and straighten it. Pelvic Tilt   Do these steps 5-10 times in a row: 1. Lie on your back on a firm bed or the floor with your legs stretched out. 2. Bend your knees so they point up to the ceiling. Your feet should be flat on the floor. 3. Tighten your lower belly (abdomen) muscles to press your lower back against the floor. This will make your tailbone point up to the ceiling instead of pointing down to your feet or the floor. 4. Stay in this position for 5-10 seconds while you gently tighten your muscles and  breathe evenly. Cat-Cow   Do these steps until your lower back bends more easily: 1. Get on your hands and knees on a firm surface. Keep your hands under your shoulders, and keep your knees under your hips. You may put padding under your knees. 2. Let your head hang down, and make your tailbone point down to the floor so your lower back is round like the back of a cat. 3. Stay in this position for 5 seconds. 4. Slowly lift your head and make your tailbone point up to the ceiling so your back hangs low (sags) like the back of a cow. 5. Stay in this position for 5 seconds. Press-Ups   Do these steps 5-10 times in a row: 1. Lie on your belly (face-down) on the floor. 2. Place your hands near your head, about shoulder-width apart. 3. While you keep your back relaxed and keep your hips on the floor, slowly straighten your arms to raise the top half of your body and lift your shoulders. Do not use your back muscles. To make yourself more comfortable, you may change where you place your hands. 4. Stay in this position for 5 seconds. 5. Slowly return to lying flat on the floor. Bridges   Do these steps 10 times in a row: 1. Lie on your back on a firm surface. 2. Bend your knees so they point up to the ceiling. Your feet should be flat on the floor. 3. Tighten your butt muscles and  lift your butt off of the floor until your waist is almost as high as your knees. If you do not feel the muscles working in your butt and the back of your thighs, slide your feet 1-2 inches farther away from your butt. 4. Stay in this position for 3-5 seconds. 5. Slowly lower your butt to the floor, and let your butt muscles relax. If this exercise is too easy, try doing it with your arms crossed over your chest. Back Lifts  Do these steps 5-10 times in a row: 1. Lie on your belly (face-down) with your arms at your sides, and rest your forehead on the floor. 2. Tighten the muscles in your legs and your butt. 3. Slowly lift your chest off of the floor while you keep your hips on the floor. Keep the back of your head in line with the curve in your back. Look at the floor while you do this. 4. Stay in this position for 3-5 seconds. 5. Slowly lower your chest and your face to the floor. Contact a doctor if:  Your back pain gets a lot worse when you do an exercise.  Your back pain does not lessen 2 hours after you exercise. If you have any of these problems, stop doing the exercises. Do not do them again unless your doctor says it is okay. Get help right away if:  You have sudden, very bad back pain. If this happens, stop doing the exercises. Do not do them again unless your doctor says it is okay. This information is not intended to replace advice given to you by your health care provider. Make sure you discuss any questions you have with your health care provider. Document Released: 01/08/2011 Document Revised: 05/13/2016 Document Reviewed: 01/30/2015 Elsevier Interactive Patient Education  2017 [employer].

## 2017-05-12 NOTE — Progress Notes (Signed)
  Subjective:   Patient ID: Amber Colon, female    DOB: 12/31/1980, 36 y.o.   MRN: [id number] CC: Follow-up (8 week) and Back Pain (Lower)  HPI: Amber Colon is a 36 y.o. female presenting for Follow-up (8 week) and Back Pain (Lower)  Depression:  Mood improved Citalopram has been giving her headaches, when she misses a day  Doesn't have headaches  stopped two weeks ago  Tobacco use: hasnt started wellbutrin yet Ongoing stress from upcming move  HTN: doesn't check at home No headaches since stopping the citalopram  Abd feeling fine Thinks hurts more when she eats greasy foods, fast foods, not bothered her recently  Having low back pain for last few weeks Chiropractor she thinks helped some, using biofreeze Muscles feel tight  Been much more active recently fixing house Moving into new home next week Sleeping ok  Relevant past medical, surgical, family and social history reviewed. Allergies and medications reviewed and updated. History  Smoking Status  . Current Some Day Smoker  . Packs/day: 0.25  . Years: 2.00  Smokeless Tobacco  . Never Used    Comment: 2-3 cigfs a day   ROS: Per HPI   Objective:    BP 133/88   Pulse 78   Temp 98.6 F (37 C) (Oral)   Ht 5\' 6"  (1.676 m)   Wt (!) 336 lb 3.2 oz (152.5 kg)   BMI 54.26 kg/m   Wt Readings from Last 3 Encounters:  05/12/17 (!) 336 lb 3.2 oz (152.5 kg)  03/17/17 (!) 336 lb 12.8 oz (152.8 kg)  03/08/17 (!) 340 lb 3.2 oz (154.3 kg)    Gen: NAD, alert, cooperative with exam, NCAT EYES: EOMI, no conjunctival injection, or no icterus ENT:  TMs pearly gray b/l, OP without erythema LYMPH: no cervical LAD CV: NRRR, normal S1/S2, no murmur Resp: CTABL, no wheezes, normal WOB Abd: +BS, soft, NT, obese Ext: No edema, warm Neuro: Alert and oriented MSK: no point tenderness over spine, ttp b/l paraspinal muscles mid back  Assessment & Plan:  Amber Colon was seen today for follow-up and back pain.  Diagnoses and all  orders for this visit:  Essential hypertension Slightly elevated today, recheck next visit Cont HCTZ  Depression Stable, HA with celexa, will start wellbutrin Trying to quit smoking as well No thoughts of self harm  Current smoker wellbutrin as above, cont to encourage cessation  Morbid obesity with BMI of 50.0-59.9, adult (HCC) Cont lifestyle changes, walking more with daughter, avoiding sugary beverages  Acute bilateral low back pain without sciatica Rest, ice, NSAIDs, gave gentle back exercises If not improving let me know -     ibuprofen (ADVIL,MOTRIN) 600 MG tablet; Take 1 tablet (600 mg total) by mouth every 6 (six) hours as needed. -     cyclobenzaprine (FLEXERIL) 10 MG tablet; Take 1 tablet (10 mg total) by mouth 3 (three) times daily as needed for muscle spasms.   Follow up plan: Return in about 3 months (around 08/12/2017). Amber Krasarol Aurelie Dicenzo, MD [facility]

## 2017-06-07 ENCOUNTER — Other Ambulatory Visit: Payer: Self-pay | Admitting: Pediatrics

## 2017-06-09 ENCOUNTER — Other Ambulatory Visit: Payer: Self-pay | Admitting: *Deleted

## 2017-06-15 ENCOUNTER — Other Ambulatory Visit: Payer: Self-pay | Admitting: Pediatrics

## 2017-07-20 ENCOUNTER — Ambulatory Visit: Payer: Medicaid Other | Admitting: Family

## 2017-07-20 ENCOUNTER — Encounter: Payer: Self-pay | Admitting: Family Medicine

## 2017-07-20 ENCOUNTER — Ambulatory Visit (INDEPENDENT_AMBULATORY_CARE_PROVIDER_SITE_OTHER): Payer: Medicaid Other | Admitting: Family Medicine

## 2017-07-20 VITALS — BP 140/103 | HR 91 | Temp 97.4°F

## 2017-07-20 DIAGNOSIS — Z202 Contact with and (suspected) exposure to infections with a predominantly sexual mode of transmission: Secondary | ICD-10-CM

## 2017-07-20 NOTE — Progress Notes (Signed)
36 year old female who desires to be checked for sexually transmitted diseases. She is asymptomatic but has had discharge chronically. She had sexual intercourse yesterday but there is no history of known exposure to infection. She seems anxious today and I think is more here related to that anxiety, certainly, with no symptoms. We spent some time talking about STDs. I explained that they all have some incubation. And without known exposure, that is sexual partner saying that he or she had disease that I would wait at least 1 or 2 weeks before any testing. Also, if we tested her one day after sexual encounter, negative tests would prove of little value. She seemed to understand and accept this reasoning. I suspect she will return in one week, whether or not symptoms develop. I did not charge her for office visit today I did mention that blood pressure is elevated today and should be followed up.

## 2017-07-28 ENCOUNTER — Encounter: Payer: Self-pay | Admitting: Pediatrics

## 2017-07-28 ENCOUNTER — Ambulatory Visit (INDEPENDENT_AMBULATORY_CARE_PROVIDER_SITE_OTHER): Payer: Medicaid Other | Admitting: Pediatrics

## 2017-07-28 ENCOUNTER — Telehealth: Payer: Self-pay | Admitting: *Deleted

## 2017-07-28 VITALS — BP 115/81 | HR 92 | Temp 99.0°F | Ht 66.0 in | Wt 331.0 lb

## 2017-07-28 DIAGNOSIS — K219 Gastro-esophageal reflux disease without esophagitis: Secondary | ICD-10-CM

## 2017-07-28 DIAGNOSIS — I10 Essential (primary) hypertension: Secondary | ICD-10-CM | POA: Diagnosis not present

## 2017-07-28 DIAGNOSIS — Z9049 Acquired absence of other specified parts of digestive tract: Secondary | ICD-10-CM | POA: Diagnosis not present

## 2017-07-28 DIAGNOSIS — R197 Diarrhea, unspecified: Secondary | ICD-10-CM | POA: Diagnosis not present

## 2017-07-28 DIAGNOSIS — Z113 Encounter for screening for infections with a predominantly sexual mode of transmission: Secondary | ICD-10-CM

## 2017-07-28 DIAGNOSIS — K9189 Other postprocedural complications and disorders of digestive system: Secondary | ICD-10-CM

## 2017-07-28 DIAGNOSIS — E039 Hypothyroidism, unspecified: Secondary | ICD-10-CM

## 2017-07-28 LAB — WET PREP FOR TRICH, YEAST, CLUE
Clue Cell Exam: NEGATIVE
Trichomonas Exam: NEGATIVE
Yeast Exam: NEGATIVE

## 2017-07-28 MED ORDER — LEVOTHYROXINE SODIUM 75 MCG PO TABS: 75.0000 ug | ORAL_TABLET | Freq: Every day | ORAL | 1 refills | Status: DC

## 2017-07-28 MED ORDER — HYDROCHLOROTHIAZIDE 25 MG PO TABS: 25.0000 mg | ORAL_TABLET | Freq: Every day | ORAL | 1 refills | Status: DC

## 2017-07-28 MED ORDER — OMEPRAZOLE 20 MG PO CPDR: DELAYED_RELEASE_CAPSULE | ORAL | 1 refills | Status: DC

## 2017-07-28 MED ORDER — COLESTIPOL HCL 1 G PO TABS: 2.0000 g | ORAL_TABLET | Freq: Two times a day (BID) | ORAL | 1 refills | Status: DC

## 2017-07-28 NOTE — Telephone Encounter (Signed)
errror

## 2017-07-28 NOTE — Progress Notes (Signed)
  Subjective:   Patient ID: Amber Colon, female    DOB: [birth year], 36 y.o.   MRN: [id number] CC: STD check  HPI: Amber Colon is a 36 y.o. female presenting for STD check  About a week ago had intercourse with new partner, did not use condoms H/o tube tie for birth control No vaginal discharge, itching, irritation  Anxiety is better, mood is better Divorcing her husband Has met a new person Feels like weight has been lifted Kids supportive  Taking wellbutrin, helping some Smoking decreasing some  Not needing wellbutrin Diarrhea continues ot be controlled with colestid  No HA, no CP, no SOB, taking HCTZ daily  Taking thyroid medicine daily  Relevant past medical, surgical, family and social history reviewed. Allergies and medications reviewed and updated. History  Smoking Status  . Current Some Day Smoker  . Packs/day: 0.25  . Years: 2.00  Smokeless Tobacco  . Never Used    Comment: 2-3 cigfs a day   ROS: Per HPI   Objective:    BP 115/81   Pulse 92   Temp 99 F (37.2 C) (Oral)   Ht '5\' 6"'$  (1.676 m)   Wt (!) 331 lb (150.1 kg)   BMI 53.42 kg/m   Wt Readings from Last 3 Encounters:  07/28/17 (!) 331 lb (150.1 kg)  05/12/17 (!) 336 lb 3.2 oz (152.5 kg)  03/17/17 (!) 336 lb 12.8 oz (152.8 kg)    Gen: NAD, alert, cooperative with exam, NCAT EYES: EOMI, no conjunctival injection, or no icterus CV: NRRR, normal S1/S2, no murmur, distal pulses 2+ b/l Resp: CTABL, no wheezes, normal WOB Abd: +BS, soft, NTND. no guarding or organomegaly Ext: No edema, warm Neuro: Alert and oriented, strength equal b/l UE and LE, coordination grossly normal MSK: normal muscle bulk Psych: normal affect, mood is "better", no thoughts of self harm  Assessment & Plan:  Amber Colon was seen today for std check and med follow up  Diagnoses and all orders for this visit:  Essential hypertension Well controlled, cont current med -     hydrochlorothiazide (HYDRODIURIL) 25 MG  tablet; Take 1 tablet (25 mg total) by mouth daily. -     BMP8+EGFR  Postcholecystectomy diarrhea Symptoms stable on below, cont -     colestipol (COLESTID) 1 g tablet; Take 2 tablets (2 g total) by mouth 2 (two) times daily.  Hypothyroidism, unspecified type Stable, cont -     levothyroxine (SYNTHROID, LEVOTHROID) 75 MCG tablet; Take 1 tablet (75 mcg total) by mouth daily. -     TSH  Gastroesophageal reflux disease, esophagitis presence not specified Stable, cont below prn -     omeprazole (PRILOSEC) 20 MG capsule; TAKE ONE CAPSULE BY MOUTH once DAILY BEFORE  A  MEAL  Routine screening for STI (sexually transmitted infection) -     HIV antibody -     RPR -     WET PREP FOR TRICH, YEAST, CLUE -     GC/Chlamydia Probe Amp   Follow up plan: Return in about 6 months (around 01/28/2018). Assunta Found, MD [place]

## 2017-07-29 LAB — BMP8+EGFR
BUN / CREAT RATIO: 10 (ref 9–23)
BUN: 12 mg/dL (ref 6–20)
CHLORIDE: 94 mmol/L — AB (ref 96–106)
CO2: 24 mmol/L (ref 20–29)
Calcium: 9.9 mg/dL (ref 8.7–10.2)
Creatinine, Ser: 1.22 mg/dL — ABNORMAL HIGH (ref 0.57–1.00)
GFR calc Af Amer: 66 mL/min/{1.73_m2} (ref >59)
GFR calc non Af Amer: 58 mL/min/{1.73_m2} — ABNORMAL LOW (ref >59)
GLUCOSE: 99 mg/dL (ref 65–99)
Potassium: 3.5 mmol/L (ref 3.5–5.2)
Sodium: 136 mmol/L (ref 134–144)

## 2017-07-29 LAB — RPR: RPR Ser Ql: NONREACTIVE

## 2017-07-29 LAB — GC/CHLAMYDIA PROBE AMP
Chlamydia trachomatis, NAA: NEGATIVE
Neisseria gonorrhoeae by PCR: NEGATIVE

## 2017-07-29 LAB — TSH: TSH: 3.92 u[IU]/mL (ref 0.450–4.500)

## 2017-07-29 LAB — HIV ANTIBODY (ROUTINE TESTING W REFLEX): HIV SCREEN 4TH GENERATION: NONREACTIVE

## 2017-08-04 ENCOUNTER — Emergency Department (HOSPITAL_COMMUNITY): Payer: Medicaid Other

## 2017-08-04 ENCOUNTER — Telehealth: Payer: Self-pay | Admitting: *Deleted

## 2017-08-04 ENCOUNTER — Emergency Department (HOSPITAL_COMMUNITY)
Admission: EM | Admit: 2017-08-04 | Discharge: 2017-08-04 | Disposition: A | Discharge status: Home / Self Care | Payer: Medicaid Other | Attending: Emergency Medicine | Admitting: Emergency Medicine

## 2017-08-04 ENCOUNTER — Encounter (HOSPITAL_COMMUNITY): Payer: Self-pay | Admitting: Emergency Medicine

## 2017-08-04 DIAGNOSIS — F419 Anxiety disorder, unspecified: Secondary | ICD-10-CM | POA: Diagnosis not present

## 2017-08-04 DIAGNOSIS — F17210 Nicotine dependence, cigarettes, uncomplicated: Secondary | ICD-10-CM | POA: Diagnosis not present

## 2017-08-04 DIAGNOSIS — E039 Hypothyroidism, unspecified: Secondary | ICD-10-CM | POA: Insufficient documentation

## 2017-08-04 DIAGNOSIS — I10 Essential (primary) hypertension: Secondary | ICD-10-CM | POA: Insufficient documentation

## 2017-08-04 DIAGNOSIS — R079 Chest pain, unspecified: Secondary | ICD-10-CM | POA: Diagnosis not present

## 2017-08-04 DIAGNOSIS — R072 Precordial pain: Secondary | ICD-10-CM | POA: Diagnosis not present

## 2017-08-04 DIAGNOSIS — R002 Palpitations: Secondary | ICD-10-CM | POA: Diagnosis not present

## 2017-08-04 DIAGNOSIS — E876 Hypokalemia: Secondary | ICD-10-CM | POA: Diagnosis not present

## 2017-08-04 DIAGNOSIS — Z79899 Other long term (current) drug therapy: Secondary | ICD-10-CM | POA: Diagnosis not present

## 2017-08-04 LAB — BASIC METABOLIC PANEL
Anion gap: 11 (ref 5–15)
BUN: 10 mg/dL (ref 6–20)
CHLORIDE: 96 mmol/L — AB (ref 101–111)
CO2: 29 mmol/L (ref 22–32)
CREATININE: 1.08 mg/dL — AB (ref 0.44–1.00)
Calcium: 9.5 mg/dL (ref 8.9–10.3)
Glucose, Bld: 111 mg/dL — ABNORMAL HIGH (ref 65–99)
Potassium: 2.8 mmol/L — ABNORMAL LOW (ref 3.5–5.1)
SODIUM: 136 mmol/L (ref 135–145)

## 2017-08-04 LAB — CBC WITH DIFFERENTIAL/PLATELET
Basophils Absolute: 0.1 10*3/uL (ref 0.0–0.1)
Basophils Relative: 1 %
EOS ABS: 0.2 10*3/uL (ref 0.0–0.7)
EOS PCT: 3 %
HCT: 38.3 % (ref 36.0–46.0)
HEMOGLOBIN: 12.2 g/dL (ref 12.0–15.0)
LYMPHS ABS: 1.9 10*3/uL (ref 0.7–4.0)
Lymphocytes Relative: 29 %
MCH: 25.8 pg — AB (ref 26.0–34.0)
MCHC: 31.9 g/dL (ref 30.0–36.0)
MCV: 81 fL (ref 78.0–100.0)
Monocytes Absolute: 0.6 10*3/uL (ref 0.1–1.0)
Monocytes Relative: 9 %
NEUTROS PCT: 58 %
Neutro Abs: 3.8 10*3/uL (ref 1.7–7.7)
Platelets: 200 10*3/uL (ref 150–400)
RBC: 4.73 MIL/uL (ref 3.87–5.11)
RDW: 15.4 % (ref 11.5–15.5)
WBC: 6.6 10*3/uL (ref 4.0–10.5)

## 2017-08-04 MED ORDER — POTASSIUM CHLORIDE ER 10 MEQ PO TBCR: 10.0000 meq | EXTENDED_RELEASE_TABLET | Freq: Two times a day (BID) | ORAL | 0 refills | Status: DC

## 2017-08-04 NOTE — ED Provider Notes (Signed)
[facility]-EMER[facility]ENCY DEPT Provider Note   CSN: [id number] Arrival date & time: 08/04/17  1024     History   Chief Complaint Chief Complaint  Patient presents with  [id number]. Chest Pain    HPI Amber Colon is a 36 y.o. female.  HPI Patient presents with chest pain or palpitations. Began last night. States she was seen by EMS and told she was having a panic attack. Cannot go to the hospital at that time. States that he came back. States she called back and sedated would have a longer wait to get her there. States she waited until this morning had pain again. States it felt as if there was palpitations with it. Felt anxious. States her mouth and hands clamped up. No history of coronary artery disease. Has a history of  Past Medical History:  Diagnosis Date  . Anemia    not taking her iron pills either  . Anxiety   . Depression   . GERD (gastroesophageal reflux disease)   . Headache    "occasional"  . History of blood transfusion    blood after miscarrige  . Hypertension    doesn't take her bp pills  . Hypothyroidism    again, takes no med  . IBS (irritable bowel syndrome)     Patient Active Problem List   Diagnosis Date Noted  . 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 Depression 12/24/2016  . S/P cholecystectomy 09/23/2016  . Current smoker 02/27/2016  . GAD (generalized anxiety disorder) 01/22/2016  . GERD (gastroesophageal reflux disease) 01/22/2016  . Morbid obesity with BMI of 50.0-59.9, adult (HCC) 01/22/2016  . Metabolic syndrome 01/22/2016  . Hypothyroidism 06/18/2015  . Anemia, iron deficiency 06/18/2015  . Essential hypertension 06/18/2015    Past Surgical History:  Procedure Laterality Date  . CHOLECYSTECTOMY N/A 09/08/2016   Procedure: LAPAROSCOPIC CHOLECYSTECTOMY WITH INTRAOPERATIVE CHOLANGIOGRAM;  Surgeon: Chevis PrettyPaul Toth III, MD;  Location: [facility] OR;  Service: General;  Laterality: N/A;  . DILATION AND CURETTAGE OF UTERUS    . TUBAL LIGATION    .  TYMPANOMASTOIDECTOMY Left 12/24/2015   Procedure: LEFT MODIFIED  RADICAL TYMPANOMASTOIDECTOMY;  Surgeon: Newman PiesSu Teoh, MD;  Location: [facility] OR;  Service: ENT;  Laterality: Left;  . TYMPANOMASTOIDECTOMY Right 07/07/2016   Procedure: RIGHT TYMPANOMASTOIDECTOMY;  Surgeon: Newman PiesSu Teoh, MD;  Location: [facility] OR;  Service: ENT;  Laterality: Right;    OB History    No data available       Home Medications    Prior to Admission medications   Medication Sig Start Date End Date Taking? Authorizing Provider  colestipol (COLESTID) 1 g tablet Take 2 tablets (2 g total) by mouth 2 (two) times daily. 07/28/17   Johna SheriffVincent, Carol L, MD  hydrochlorothiazide (HYDRODIURIL) 25 MG tablet Take 1 tablet (25 mg total) by mouth daily. 07/28/17   Johna SheriffVincent, Carol L, MD  ibuprofen (ADVIL,MOTRIN) 600 MG tablet Take 1 tablet (600 mg total) by mouth every 6 (six) hours as needed. 05/12/17   Johna SheriffVincent, Carol L, MD  levothyroxine (SYNTHROID, LEVOTHROID) 75 MCG tablet Take 1 tablet (75 mcg total) by mouth daily. 07/28/17   Johna SheriffVincent, Carol L, MD  omeprazole (PRILOSEC) 20 MG capsule TAKE ONE CAPSULE BY MOUTH once DAILY BEFORE  A  MEAL 07/28/17   Johna SheriffVincent, Carol L, MD  potassium chloride (K-DUR) 10 MEQ tablet Take 1 tablet (10 mEq total) by mouth 2 (two) times daily. 08/04/17   Benjiman CorePickering, Terrian Ridlon, MD  traZODone (DESYREL) 50 MG tablet Take 0.5-1 tablets (25-50 mg total) by  mouth at bedtime as needed for sleep. 02/14/17   Johna Sheriff, MD    Family History Family History  Problem Relation Age of Onset  . Diabetes Mother   . Heart disease Mother   . Cancer Mother        ovarian  . Diabetes Father   . COPD Father   . Heart disease Father     Social History Social History  Substance Use Topics  . Smoking status: Current Some Day Smoker    Packs/day: 0.25    Years: 2.00    Types: Cigarettes  . Smokeless tobacco: Never Used     Comment: 2-3 cigfs a day  . Alcohol use No     Allergies   Amoxicillin   Review of Systems Review of Systems    Constitutional: Negative for appetite change and unexpected weight change.  HENT: Negative for congestion.   Respiratory: Positive for shortness of breath.   Cardiovascular: Positive for chest pain and palpitations.  Gastrointestinal: Negative for abdominal pain.  Genitourinary: Negative for hematuria and menstrual problem.  Musculoskeletal: Negative for back pain.  Neurological: Negative for syncope.     Physical Exam Updated Vital Signs BP 122/74   Pulse 63   Temp 98.2 F (36.8 C) (Oral)   Resp 14   LMP 07/28/2017   SpO2 99%   Physical Exam  Constitutional: She appears well-developed.  HENT:  Head: Atraumatic.  Patient has a "fever blister" on her upper lip with toothpaste on it.   Neck: Neck supple.  Cardiovascular: Normal rate.   Pulmonary/Chest: Effort normal.  Abdominal: Soft. There is no tenderness.  Neurological: She is alert.  Skin: Skin is warm.     ED Treatments / Results  Labs (all labs ordered are listed, but only abnormal results are displayed) Labs Reviewed  BASIC METABOLIC PANEL - Abnormal; Notable for the following:       Result Value   Potassium 2.8 (*)    Chloride 96 (*)    Glucose, Bld 111 (*)    Creatinine, Ser 1.08 (*)    All other components within normal limits  CBC WITH DIFFERENTIAL/PLATELET - Abnormal; Notable for the following:    MCH 25.8 (*)    All other components within normal limits    EKG  EKG Interpretation None       Radiology Dg Chest 2 View  Result Date: 08/04/2017 CLINICAL DATA:  Chest pain, left arm pain EXAM: CHEST  2 VIEW COMPARISON:  11/01/2012 FINDINGS: Heart and mediastinal contours are within normal limits. No focal opacities or effusions. No acute bony abnormality. IMPRESSION: No active cardiopulmonary disease. Electronically Signed   By: Charlett Nose M.D.   On: 08/04/2017 11:02    Procedures Procedures (including critical care time)   Medica itions Ordered in ED Medications - No data to  display   Initial Impression / Assessment and Plan / ED Course  I have reviewed the triage vital signs and the nursing notes.  Pertinent labs & imaging results that were available during my care of the patient were reviewed by me and considered in my medical decision making (see chart for details).     Patient with chest pain and palpitations. EKG and labs reassuring. Mild hypokalemia. Doubt PE or MI. D/c home.  Final Clinical Impressions(s) / ED Diagnoses   Final diagnoses:  Chest pain  Palpitations  Hypokalemia    New Prescriptions Discharge Medication List as of 08/04/2017  1:12 PM    START taking  these medications   Details  potassium chloride (K-DUR) 10 MEQ tablet Take 1 tablet (10 mEq total) by mouth 2 (two) times daily., Starting Thu 08/04/2017, Print         Benjiman Core, MD 08/04/17 [phone]

## 2017-08-04 NOTE — ED Triage Notes (Signed)
Pt states chest pain in the center of chest, left arm pain and stomach pain started last night, pt called ems and was told she was having an anxiety attack. Pt states chest pain came back this morning after waking up.

## 2017-08-04 NOTE — Telephone Encounter (Signed)
Aware. Will f/u ED note.

## 2017-08-04 NOTE — Telephone Encounter (Signed)
pt called stating she called EMS twice yesterday due to panic attacks and they told her to see her PCP for anxiety meds. Pt is having chest pain, tingling in her arms and mouth and just doesn't feel right and is SOB. Pt was driving to hospital as we were talking but did schedule a f/u appt with you 8/24 to discuss restarting her Wellbutrin which she had been doing well on.

## 2017-08-12 ENCOUNTER — Encounter: Payer: Self-pay | Admitting: Pediatrics

## 2017-08-12 ENCOUNTER — Ambulatory Visit (INDEPENDENT_AMBULATORY_CARE_PROVIDER_SITE_OTHER): Payer: Medicaid Other | Admitting: Pediatrics

## 2017-08-12 ENCOUNTER — Ambulatory Visit: Payer: Medicaid Other | Admitting: Pediatrics

## 2017-08-12 VITALS — BP 134/86 | HR 100 | Temp 98.9°F | Ht 66.0 in | Wt 328.0 lb

## 2017-08-12 DIAGNOSIS — F32A Depression, unspecified: Secondary | ICD-10-CM

## 2017-08-12 DIAGNOSIS — F329 Major depressive disorder, single episode, unspecified: Secondary | ICD-10-CM | POA: Diagnosis not present

## 2017-08-12 DIAGNOSIS — E876 Hypokalemia: Secondary | ICD-10-CM | POA: Diagnosis not present

## 2017-08-12 MED ORDER — BUPROPION HCL ER (SR) 150 MG PO TB12: 150.0000 mg | ORAL_TABLET | Freq: Two times a day (BID) | ORAL | 1 refills | Status: DC

## 2017-08-12 NOTE — Progress Notes (Signed)
  Subjective:   Patient ID: Amber Colon, female    DOB: [birth year], 36 y.o.   MRN: [id number] CC: Anxiety and Hypokakemia  HPI: Amber Colon is a 36 y.o. female presenting for Anxiety and Hypokakemia Had 3 panic attacks back to back last week, went to ED for eval Panic has been better since being in the ED Trying to do breathing exercises Wants to restart wellbutrin Felt better when taking regularly  Mood continues to be better than in the past  Has been eating until full then stopping, not snacking as much Walking regularly Pt pleased with weight loss  Relevant past medical, surgical, family and social history reviewed. Allergies and medications reviewed and updated. History  Smoking Status  . Current Some Day Smoker  . Packs/day: 0.25  . Years: 2.00  . Types: Cigarettes  Smokeless Tobacco  . Never Used    Comment: 2-3 cigfs a day   ROS: Per HPI   Objective:    BP 134/86   Pulse 100   Temp 98.9 F (37.2 C) (Oral)   Ht '5\' 6"'$  (1.676 m)   Wt (!) 328 lb (148.8 kg)   LMP 07/28/2017   BMI 52.94 kg/m   Wt Readings from Last 3 Encounters:  08/12/17 (!) 328 lb (148.8 kg)  07/28/17 (!) 331 lb (150.1 kg)  05/12/17 (!) 336 lb 3.2 oz (152.5 kg)    Gen: NAD, alert, cooperative with exam, NCAT EYES: EOMI, no conjunctival injection, or no icterus ENT: OP without erythema CV: NRRR, normal S1/S2, no murmur, distal pulses 2+ b/l Resp: CTABL, no wheezes, normal WOB Abd: +BS, soft, NTND. no guarding or organomegaly Ext: No edema, warm Neuro: Alert and oriented MSK: normal muscle bulk  Assessment & Plan:  Eshani was seen today for anxiety and hypokakemia.  Diagnoses and all orders for this visit:  Depression, unspecified depression type Start below Let me know if symptoms cont -     buPROPion (WELLBUTRIN SR) 150 MG 12 hr tablet; Take 1 tablet (150 mg total) by mouth 2 (two) times daily.  Hypokalemia Recheck below Done with K replacement from ED -      BMP8+EGFR   Follow up plan: Return in about 3 months (around 11/12/2017). Assunta Found, MD [place]

## 2017-08-12 NOTE — Patient Instructions (Addendum)
[facility]  [address] [place], [place] [address] (ph) [phone]  [address], [address] [place],  [address] (ph) [phone]  

## 2017-08-13 LAB — BMP8+EGFR
BUN / CREAT RATIO: 7 — AB (ref 9–23)
BUN: 7 mg/dL (ref 6–20)
CO2: 25 mmol/L (ref 20–29)
CREATININE: 1.04 mg/dL — AB (ref 0.57–1.00)
Calcium: 10 mg/dL (ref 8.7–10.2)
Chloride: 97 mmol/L (ref 96–106)
GFR calc Af Amer: 80 mL/min/{1.73_m2} (ref >59)
GFR, EST NON AFRICAN AMERICAN: 70 mL/min/{1.73_m2} (ref >59)
Glucose: 97 mg/dL (ref 65–99)
Potassium: 4 mmol/L (ref 3.5–5.2)
SODIUM: 139 mmol/L (ref 134–144)

## 2017-08-24 ENCOUNTER — Encounter (HOSPITAL_COMMUNITY): Payer: Self-pay | Admitting: Emergency Medicine

## 2017-08-24 ENCOUNTER — Emergency Department (HOSPITAL_COMMUNITY)
Admission: EM | Admit: 2017-08-24 | Discharge: 2017-08-24 | Disposition: A | Discharge status: Home / Self Care | Payer: Medicaid Other | Attending: Emergency Medicine | Admitting: Emergency Medicine

## 2017-08-24 ENCOUNTER — Emergency Department (HOSPITAL_COMMUNITY): Payer: Medicaid Other

## 2017-08-24 DIAGNOSIS — R002 Palpitations: Secondary | ICD-10-CM | POA: Insufficient documentation

## 2017-08-24 DIAGNOSIS — R0602 Shortness of breath: Secondary | ICD-10-CM | POA: Insufficient documentation

## 2017-08-24 DIAGNOSIS — Z79899 Other long term (current) drug therapy: Secondary | ICD-10-CM | POA: Diagnosis not present

## 2017-08-24 DIAGNOSIS — I10 Essential (primary) hypertension: Secondary | ICD-10-CM | POA: Insufficient documentation

## 2017-08-24 DIAGNOSIS — N390 Urinary tract infection, site not specified: Secondary | ICD-10-CM | POA: Diagnosis not present

## 2017-08-24 DIAGNOSIS — F419 Anxiety disorder, unspecified: Secondary | ICD-10-CM | POA: Diagnosis not present

## 2017-08-24 DIAGNOSIS — E039 Hypothyroidism, unspecified: Secondary | ICD-10-CM | POA: Diagnosis not present

## 2017-08-24 DIAGNOSIS — F17210 Nicotine dependence, cigarettes, uncomplicated: Secondary | ICD-10-CM | POA: Insufficient documentation

## 2017-08-24 DIAGNOSIS — R079 Chest pain, unspecified: Secondary | ICD-10-CM | POA: Diagnosis present

## 2017-08-24 HISTORY — DX: Panic disorder (episodic paroxysmal anxiety): F41.0

## 2017-08-24 LAB — BASIC METABOLIC PANEL
ANION GAP: 10 (ref 5–15)
BUN: 9 mg/dL (ref 6–20)
CO2: 27 mmol/L (ref 22–32)
Calcium: 9.5 mg/dL (ref 8.9–10.3)
Chloride: 100 mmol/L — ABNORMAL LOW (ref 101–111)
Creatinine, Ser: 1.07 mg/dL — ABNORMAL HIGH (ref 0.44–1.00)
GLUCOSE: 110 mg/dL — AB (ref 65–99)
POTASSIUM: 3 mmol/L — AB (ref 3.5–5.1)
Sodium: 137 mmol/L (ref 135–145)

## 2017-08-24 LAB — URINALYSIS, ROUTINE W REFLEX MICROSCOPIC
BILIRUBIN URINE: NEGATIVE
GLUCOSE, UA: NEGATIVE mg/dL
KETONES UR: NEGATIVE mg/dL
NITRITE: NEGATIVE
PH: 5 (ref 5.0–8.0)
PROTEIN: NEGATIVE mg/dL
Specific Gravity, Urine: 1.019 (ref 1.005–1.030)

## 2017-08-24 LAB — PREGNANCY, URINE: Preg Test, Ur: NEGATIVE

## 2017-08-24 LAB — CBC WITH DIFFERENTIAL/PLATELET
BASOS PCT: 1 %
Basophils Absolute: 0.1 10*3/uL (ref 0.0–0.1)
Eosinophils Absolute: 0.3 10*3/uL (ref 0.0–0.7)
Eosinophils Relative: 3 %
HEMATOCRIT: 38.8 % (ref 36.0–46.0)
HEMOGLOBIN: 12.6 g/dL (ref 12.0–15.0)
Lymphocytes Relative: 20 %
Lymphs Abs: 2 10*3/uL (ref 0.7–4.0)
MCH: 26.3 pg (ref 26.0–34.0)
MCHC: 32.5 g/dL (ref 30.0–36.0)
MCV: 81 fL (ref 78.0–100.0)
MONOS PCT: 7 %
Monocytes Absolute: 0.7 10*3/uL (ref 0.1–1.0)
NEUTROS ABS: 6.9 10*3/uL (ref 1.7–7.7)
NEUTROS PCT: 69 %
Platelets: 218 10*3/uL (ref 150–400)
RBC: 4.79 MIL/uL (ref 3.87–5.11)
RDW: 15.4 % (ref 11.5–15.5)
WBC: 10 10*3/uL (ref 4.0–10.5)

## 2017-08-24 LAB — TROPONIN I: Troponin I: <0.03 ng/mL (ref <0.03)

## 2017-08-24 MED ORDER — CEPHALEXIN 500 MG PO CAPS: 500.0000 mg | ORAL_CAPSULE | Freq: Four times a day (QID) | ORAL | 0 refills | Status: DC

## 2017-08-24 MED ORDER — POTASSIUM CHLORIDE CRYS ER 20 MEQ PO TBCR
40.0000 meq | EXTENDED_RELEASE_TABLET | Freq: Once | ORAL | Status: AC
  Administered 2017-08-24: 40 meq via ORAL
  Filled 2017-08-24: qty 2

## 2017-08-24 MED ORDER — CEPHALEXIN 500 MG PO CAPS
500.0000 mg | ORAL_CAPSULE | Freq: Once | ORAL | Status: AC
  Administered 2017-08-24: 500 mg via ORAL
  Filled 2017-08-24: qty 1

## 2017-08-24 MED ORDER — HYDROXYZINE HCL 25 MG PO TABS
25.0000 mg | ORAL_TABLET | Freq: Once | ORAL | Status: AC
  Administered 2017-08-24: 25 mg via ORAL
  Filled 2017-08-24: qty 1

## 2017-08-24 MED ORDER — HYDROXYZINE HCL 25 MG PO TABS: 25.0000 mg | ORAL_TABLET | Freq: Four times a day (QID) | ORAL | 0 refills | Status: DC | PRN

## 2017-08-24 NOTE — Discharge Instructions (Signed)
Take the prescriptions as directed.  Call your regular medical doctor and your mental health provider tomorrow to schedule a follow up appointment within the next week.  Return to the Emergency Department immediately sooner if worsening.

## 2017-08-24 NOTE — ED Triage Notes (Signed)
Pt has been on bupropion 150 mg twice a day starting Monday. Pt started having SOB, chest pain, tingling in left arm at 1630 today.  VSS.

## 2017-08-24 NOTE — ED Provider Notes (Signed)
[facility] Provider Note   CSN: [id number] Arrival date & time: 08/24/17  1806     History   Chief Complaint Chief Complaint  Patient presents with  . Allergic Reaction  . Anxiety    HPI Amber Colon is a 36 y.o. female.   Allergic Reaction    Pt was seen at 1820.  Per pt, c/o gradual onset and persistence of constant generalized chest "pain" since yesterday. Pt describes the CP as constant, "pressure," and has been associated with palpitations and SOB. Pt states her PMD started her on Wellbutrin 2 days ago for anxiety/panic attacks. Pt describes these symptoms as consistent with her previous panic attacks. Denies cough, no abd pain, no N/V/D, no back pain, no wheezing/stridor, no rash, no SI/SA, no HI, no hallucinations.   Past Medical History:  Diagnosis Date  . Anemia    not taking her iron pills either  . Anxiety   . Depression   . GERD (gastroesophageal reflux disease)   . Headache    "occasional"  . History of blood transfusion    blood after miscarrige  . Hypertension    doesn't take her bp pills  . Hypothyroidism    again, takes no med  . Panic attack     Patient Active Problem List   Diagnosis Date Noted  . 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 Depression 12/24/2016  . S/P cholecystectomy 09/23/2016  . Current smoker 02/27/2016  . GAD (generalized anxiety disorder) 01/22/2016  . GERD (gastroesophageal reflux disease) 01/22/2016  . Morbid obesity with BMI of 50.0-59.9, adult (HCC) 01/22/2016  . Metabolic syndrome 01/22/2016  . Hypothyroidism 06/18/2015  . Anemia, iron deficiency 06/18/2015  . Essential hypertension 06/18/2015    Past Surgical History:  Procedure Laterality Date  . CHOLECYSTECTOMY N/A 09/08/2016   Procedure: LAPAROSCOPIC CHOLECYSTECTOMY WITH INTRAOPERATIVE CHOLANGIOGRAM;  Surgeon: Chevis Pretty III, MD;  Location: [facility] OR;  Service: General;  Laterality: N/A;  . DILATION AND CURETTAGE OF UTERUS    . TUBAL LIGATION    .  TYMPANOMASTOIDECTOMY Left 12/24/2015   Procedure: LEFT MODIFIED  RADICAL TYMPANOMASTOIDECTOMY;  Surgeon: Newman Pies, MD;  Location: [facility] OR;  Service: ENT;  Laterality: Left;  . TYMPANOMASTOIDECTOMY Right 07/07/2016   Procedure: RIGHT TYMPANOMASTOIDECTOMY;  Surgeon: Newman Pies, MD;  Location: [facility] OR;  Service: ENT;  Laterality: Right;    OB History    Gravida Para Term Preterm AB Living   4         3   SAB TAB Ectopic Multiple Live Births                   Home Medications    Prior to Admission medications   Medication Sig Start Date End Date Taking? Authorizing Provider  buPROPion (WELLBUTRIN SR) 150 MG 12 hr tablet Take 1 tablet (150 mg total) by mouth 2 (two) times daily. 08/12/17   Johna Sheriff, MD  colestipol (COLESTID) 1 g tablet Take 2 tablets (2 g total) by mouth 2 (two) times daily. 07/28/17   Johna Sheriff, MD  hydrochlorothiazide (HYDRODIURIL) 25 MG tablet Take 1 tablet (25 mg total) by mouth daily. 07/28/17   Johna Sheriff, MD  ibuprofen (ADVIL,MOTRIN) 600 MG tablet Take 1 tablet (600 mg total) by mouth every 6 (six) hours as needed. 05/12/17   Johna Sheriff, MD  levothyroxine (SYNTHROID, LEVOTHROID) 75 MCG tablet Take 1 tablet (75 mcg total) by mouth daily. 07/28/17   Johna Sheriff, MD  omeprazole (PRILOSEC)  20 MG capsule TAKE ONE CAPSULE BY MOUTH once DAILY BEFORE  A  MEAL 07/28/17   Johna SheriffVincent, Carol L, MD  potassium chloride (K-DUR) 10 MEQ tablet Take 1 tablet (10 mEq total) by mouth 2 (two) times daily. 08/04/17   Benjiman CorePickering, Nathan, MD  traZODone (DESYREL) 50 MG tablet Take 0.5-1 tablets (25-50 mg total) by mouth at bedtime as needed for sleep. 02/14/17   Johna SheriffVincent, Carol L, MD    Family History Family History  Problem Relation Age of Onset  . Diabetes Mother   . Heart disease Mother   . Cancer Mother        ovarian  . Diabetes Father   . COPD Father   . Heart disease Father     Social History Social History  Substance Use Topics  . Smoking status: Current Some Day  Smoker    Packs/day: 0.25    Years: 2.00    Types: Cigarettes  . Smokeless tobacco: Never Used     Comment: 2-3 cigfs a day  . Alcohol use No     Allergies   Amoxicillin   Review of Systems Review of Systems ROS: Statement: All systems negative except as marked or noted in the HPI; Constitutional: Negative for fever and chills. ; ; Eyes: Negative for eye pain, redness and discharge. ; ; ENMT: Negative for ear pain, hoarseness, nasal congestion, sinus pressure and sore throat. ; ; Cardiovascular: +CP, SOB, palpitations. Negative for diaphoresis, dyspnea and peripheral edema. ; ; Respiratory: Negative for cough, wheezing and stridor. ; ; Gastrointestinal: Negative for nausea, vomiting, diarrhea, abdominal pain, blood in stool, hematemesis, jaundice and rectal bleeding. . ; ; Genitourinary: Negative for dysuria, flank pain and hematuria. ; ; Musculoskeletal: Negative for back pain and neck pain. Negative for swelling and trauma.; ; Skin: Negative for pruritus, rash, abrasions, blisters, bruising and skin lesion.; ; Neuro: Negative for headache, lightheadedness and neck stiffness. Negative for weakness, altered level of consciousness, altered mental status, extremity weakness, paresthesias, involuntary movement, seizure and syncope.; Psych:  +anxiety, panic attacks. No SI, no SA, no HI, no hallucinations.      Physical Exam Updated Vital Signs BP (!) 159/91 (BP Location: Right Arm)   Pulse 78   Temp 98.3 F (36.8 C) (Oral)   Resp (!) 21   Ht 5\' 6"  (1.676 m)   Wt (!) 147.4 kg (325 lb)   LMP 07/28/2017   SpO2 99%   BMI 52.46 kg/m   Physical Exam 1825: Physical examination:  Nursing notes reviewed; Vital signs and O2 SAT reviewed;  Constitutional: Well developed, Well nourished, Well hydrated, In no acute distress; Head:  Normocephalic, atraumatic; Eyes: EOMI, PERRL, No scleral icterus; ENMT:  Mucous membranes moist. +cold sore left lower lip. Pharynx without lesions. No tonsillar  exudates. No intra-oral edema. No submandibular or sublingual edema. No hoarse voice, no drooling, no stridor. No trismus.; Neck: Supple, Full range of motion, No lymphadenopathy; Cardiovascular: Regular rate and rhythm, No gallop; Respiratory: Breath sounds clear & equal bilaterally, No wheezes.  Speaking full sentences with ease, Normal respiratory effort/excursion; Chest: Nontender, Movement normal; Abdomen: Soft, Nontender, Nondistended, Normal bowel sounds; Genitourinary: No CVA tenderness; Extremities: Pulses normal, No tenderness, No edema, No calf edema or asymmetry.; Neuro: AA&Ox3, Major CN grossly intact.  Speech clear. No gross focal motor or sensory deficits in extremities.; Skin: Color normal, Warm, Dry.; Psych:  Anxious.    ED Treatments / Results  Labs (all labs ordered are listed, but only abnormal results are displayed)  EKG  EKG Interpretation  Date/Time:  Wednesday August 24 2017 18:07:34 EDT Ventricular Rate:  79 PR Interval:    QRS Duration: 106 QT Interval:  379 QTC Calculation: 435 R Axis:   1 Text Interpretation:  Sinus rhythm Consider inferior infarct , age undetermined Baseline wander When compared with ECG of 08/04/2017 No significant change was found Confirmed by Samuel Jester [phone]) on 08/24/2017 6:30:44 PM       Radiology   Procedures Procedures (including critical care time)  Medications Ordered in ED Medications  hydrOXYzine (ATARAX/VISTARIL) tablet 25 mg (not administered)     Initial Impression / Assessment and Plan / ED Course  I have reviewed the triage vital signs and the nursing notes.  Pertinent labs & imaging results that were available during my care of the patient were reviewed by me and considered in my medical decision making (see chart for details).  MDM Reviewed: previous chart, nursing note and vitals Reviewed previous: labs and ECG Interpretation: labs, ECG and x-ray    Results for orders placed or performed during  the hospital encounter of 08/24/17  Basic metabolic panel  Result Value Ref Range   Sodium 137 135 - 145 mmol/L   Potassium 3.0 (L) 3.5 - 5.1 mmol/L   Chloride 100 (L) 101 - 111 mmol/L   CO2 27 22 - 32 mmol/L   Glucose, Bld 110 (H) 65 - 99 mg/dL   BUN 9 6 - 20 mg/dL   Creatinine, Ser 2.13 (H) 0.44 - 1.00 mg/dL   Calcium 9.5 8.9 - 08.6 mg/dL   GFR calc non Af Amer >60 >60 mL/min   GFR calc Af Amer >60 >60 mL/min   Anion gap 10 5 - 15  Troponin I  Result Value Ref Range   Troponin I <0.03 <0.03 ng/mL  CBC with Differential  Result Value Ref Range   WBC 10.0 4.0 - 10.5 K/uL   RBC 4.79 3.87 - 5.11 MIL/uL   Hemoglobin 12.6 12.0 - 15.0 g/dL   HCT 57.8 46.9 - 62.9 %   MCV 81.0 78.0 - 100.0 fL   MCH 26.3 26.0 - 34.0 pg   MCHC 32.5 30.0 - 36.0 g/dL   RDW 52.8 41.3 - 24.4 %   Platelets 218 150 - 400 K/uL   Neutrophils Relative % 69 %   Neutro Abs 6.9 1.7 - 7.7 K/uL   Lymphocytes Relative 20 %   Lymphs Abs 2.0 0.7 - 4.0 K/uL   Monocytes Relative 7 %   Monocytes Absolute 0.7 0.1 - 1.0 K/uL   Eosinophils Relative 3 %   Eosinophils Absolute 0.3 0.0 - 0.7 K/uL   Basophils Relative 1 %   Basophils Absolute 0.1 0.0 - 0.1 K/uL  Urinalysis, Routine w reflex microscopic  Result Value Ref Range   Color, Urine YELLOW YELLOW   APPearance CLOUDY (A) CLEAR   Specific Gravity, Urine 1.019 1.005 - 1.030   pH 5.0 5.0 - 8.0   Glucose, UA NEGATIVE NEGATIVE mg/dL   Hgb urine dipstick MODERATE (A) NEGATIVE   Bilirubin Urine NEGATIVE NEGATIVE   Ketones, ur NEGATIVE NEGATIVE mg/dL   Protein, ur NEGATIVE NEGATIVE mg/dL   Nitrite NEGATIVE NEGATIVE   Leukocytes, UA LARGE (A) NEGATIVE   RBC / HPF 6-30 0 - 5 RBC/hpf   WBC, UA 6-30 0 - 5 WBC/hpf   Bacteria, UA RARE (A) NONE SEEN   Squamous Epithelial / LPF 6-30 (A) NONE SEEN   Mucus PRESENT    Non Squamous Epithelial  0-5 (A) NONE SEEN  Pregnancy, urine  Result Value Ref Range   Preg Test, Ur NEGATIVE NEGATIVE   Dg Chest 2 View  Result Date:  08/24/2017 CLINICAL DATA:  Chest pain shortness of breath EXAM: CHEST  2 VIEW COMPARISON:  08/04/2017 FINDINGS: Borderline to mild cardiomegaly. No acute infiltrate or edema. No pneumothorax. IMPRESSION: Borderline cardiomegaly.  Negative for edema or infiltrate. Electronically Signed   By: Jasmine Pang M.D.   On: 08/24/2017 19:30    2045:  Doubt PE as cause for symptoms with low risk Wells.  Doubt ACS as cause for symptoms with normal troponin and unchanged EKG from previous after 2 days of constant symptoms. Potassium repleted PO. Atarax given for anxiety with improvement. Pt now states it "might feel a little weird" when she urinates; will tx for cystitis. Pt states she is ready to go home now. Doubt allergic reaction to med, as pt does not have rash/hives/wheezing/etc. Tx anxiety symptomatically at this time, f/u PMD and mental health provider. Dx and testing d/w pt and family.  Questions answered.  Verb understanding, agreeable to d/c home with outpt f/u.    Final Clinical Impressions(s) / ED Diagnoses   Final diagnoses:  None    New Prescriptions New Prescriptions   No medications on file      Samuel Jester, DO 08/29/17 1514

## 2017-08-29 ENCOUNTER — Ambulatory Visit: Payer: Self-pay | Admitting: Pediatrics

## 2017-08-29 ENCOUNTER — Ambulatory Visit (INDEPENDENT_AMBULATORY_CARE_PROVIDER_SITE_OTHER): Payer: Medicaid Other | Admitting: Otolaryngology

## 2017-08-29 DIAGNOSIS — H95123 Granulation of postmastoidectomy cavity, bilateral ears: Secondary | ICD-10-CM | POA: Diagnosis not present

## 2017-08-30 ENCOUNTER — Encounter: Payer: Self-pay | Admitting: Pediatrics

## 2017-09-05 ENCOUNTER — Encounter: Payer: Self-pay | Admitting: Pediatrics

## 2017-09-09 ENCOUNTER — Ambulatory Visit (INDEPENDENT_AMBULATORY_CARE_PROVIDER_SITE_OTHER): Payer: Medicaid Other | Admitting: Pediatrics

## 2017-09-09 ENCOUNTER — Encounter: Payer: Self-pay | Admitting: Pediatrics

## 2017-09-09 VITALS — BP 122/83 | HR 82 | Temp 98.7°F | Ht 66.0 in | Wt 324.0 lb

## 2017-09-09 DIAGNOSIS — F410 Panic disorder [episodic paroxysmal anxiety] without agoraphobia: Secondary | ICD-10-CM

## 2017-09-09 DIAGNOSIS — E876 Hypokalemia: Secondary | ICD-10-CM | POA: Diagnosis not present

## 2017-09-09 DIAGNOSIS — I10 Essential (primary) hypertension: Secondary | ICD-10-CM | POA: Diagnosis not present

## 2017-09-09 MED ORDER — SERTRALINE HCL 50 MG PO TABS: 50.0000 mg | ORAL_TABLET | Freq: Every day | ORAL | 3 refills | Status: DC

## 2017-09-09 MED ORDER — AMLODIPINE BESYLATE 5 MG PO TABS: 5.0000 mg | ORAL_TABLET | Freq: Every day | ORAL | 3 refills | Status: DC

## 2017-09-09 NOTE — Progress Notes (Signed)
  Subjective:   Patient ID: Amber Colon, female    DOB: 1981/07/26, 36 y.o.   MRN: [id number] CC: Hospitalization Follow-up and Anxiety  HPI: Amber Colon is a 36 y.o. female presenting for Hospitalization Follow-up and Anxiety  Was having panic attacks about every two weeks Since restarting wellbutrin no improvement, was told to stop at a recent ED visit, thought it was making it worse She thinks her mood was better on the citalopram but did have headaches after taking it Wants to restart No thoughts of self harm Getting along well with kids at home  Treated for UTI while in ED On keflex for a few more days No symptoms now No fevers Overall feeling well  Found in ED to again have low K On HCTZ  Tubes tied for birth control, regular periods  Relevant past medical, surgical, family and social history reviewed. Allergies and medications reviewed and updated. History  Smoking Status  . Current Some Day Smoker  . Packs/day: 0.25  . Years: 2.00  . Types: Cigarettes  Smokeless Tobacco  . Never Used    Comment: 2-3 cigfs a day   ROS: Per HPI   Objective:    BP 122/83   Pulse 82   Temp 98.7 F (37.1 C) (Oral)   Ht  (1.676 m)   Wt (!) 324 lb (147 kg)   BMI 52.29 kg/m   Wt Readings from Last 3 Encounters:  09/09/17 (!) 324 lb (147 kg)  08/24/17 (!) 325 lb (147.4 kg)  08/12/17 (!) 328 lb (148.8 kg)    Gen: NAD, alert, cooperative with exam, NCAT EYES: EOMI, no conjunctival injection, or no icterus ENT:  OP without erythema LYMPH: no cervical LAD CV: NRRR, normal S1/S2, no murmur, distal pulses 2+ b/l Resp: CTABL, no wheezes, normal WOB Abd: +BS, soft, NTND.  Ext: No edema, warm Neuro: Alert and oriented MSK: normal muscle bulk Psych: well groomed, normal affect, no thoughts of self harm  Assessment & Plan:  Amber Colon was seen today for hospitalization follow-up and anxiety.  Diagnoses and all orders for this visit:  Panic attacks Ongoing  symptoms Will start below Pt to call for counseling appt, if any trouble let me know -     sertraline (ZOLOFT) 50 MG tablet; Take 1 tablet (50 mg total) by mouth daily.  Essential hypertension Well controlled, persistently low K on HCTZ, stop HCTZ -     amLODipine (NORVASC) 5 MG tablet; Take 1 tablet (5 mg total) by mouth daily.  Hypokalemia Stop HCTZ, starting amlodipine instead for BP control  Follow up plan: Return in about 4 weeks (around 10/07/2017). Rex Kras, MD [place] [facility];l

## 2017-09-09 NOTE — Patient Instructions (Addendum)
Take half tab sertraline for 8 days, then take full tab  Can try finding counselor on this website:  Www.psychologytoday.com  Your provider wants you to schedule an appointment with a Psychologist/Psychiatrist. The following list of offices requires the patient to call and make their own appointment, as there is information they need that only you can provide. Please feel free to choose form the following providers:  [facility]   [phone] Crisis Recovery in [place] [phone]  [facility]  [phone] [place], [place]  (Scheduled through [facility]) Must call and do an interview for appointment. Sees Children / Accepts Medicaid  [facility]    [phone]  [address]    [place], [place]       [facility]  [phone] [address] [place], [place]  Evaluates for Autism but does not treat it Sees Children / Accepts Medicaid  [facility]    [phone] [address]   [place], [place] Medication management, substance abuse, bipolar, grief, family, marriage, OCD, anxiety, PTSD Sees children / Accepts Medicaid  [facility]    [phone] [address] [place], [place] Sees children / Accepts [facility]  [facility]  [phone] [address] [place], [place]   Dr Estelle Grumbles     [phone] [address] [place], [place]  Sees ADD & ADHD for treatment Accepts Medicaid  [facility]  [phone] [address] [place], [place] Evaluates for Autism Accepts [facility]  [facility]  [phone] [address] [place], [place]  Does Adult ADD evaluations Does not accept Medicaid  [facility]   [phone] [address] [place], [place] Uses animal therapy  Sees children as young as 62 years old Accepts [facility]     [phone]    [address]  [place], [place] [address] Sees children Accepts Medicaid

## 2017-09-20 ENCOUNTER — Ambulatory Visit (INDEPENDENT_AMBULATORY_CARE_PROVIDER_SITE_OTHER): Payer: Medicaid Other

## 2017-09-20 ENCOUNTER — Encounter: Payer: Self-pay | Admitting: Family Medicine

## 2017-09-20 ENCOUNTER — Ambulatory Visit (INDEPENDENT_AMBULATORY_CARE_PROVIDER_SITE_OTHER): Payer: Medicaid Other | Admitting: Family Medicine

## 2017-09-20 VITALS — BP 132/97 | HR 95 | Temp 98.3°F | Ht 66.0 in | Wt 324.0 lb

## 2017-09-20 DIAGNOSIS — R079 Chest pain, unspecified: Secondary | ICD-10-CM

## 2017-09-20 DIAGNOSIS — Z6841 Body Mass Index (BMI) 40.0 and over, adult: Secondary | ICD-10-CM

## 2017-09-20 DIAGNOSIS — F17200 Nicotine dependence, unspecified, uncomplicated: Secondary | ICD-10-CM

## 2017-09-20 DIAGNOSIS — Z8249 Family history of ischemic heart disease and other diseases of the circulatory system: Secondary | ICD-10-CM | POA: Diagnosis not present

## 2017-09-20 DIAGNOSIS — I10 Essential (primary) hypertension: Secondary | ICD-10-CM | POA: Diagnosis not present

## 2017-09-20 DIAGNOSIS — Z23 Encounter for immunization: Secondary | ICD-10-CM

## 2017-09-20 DIAGNOSIS — M79671 Pain in right foot: Secondary | ICD-10-CM

## 2017-09-20 MED ORDER — GI COCKTAIL ~~LOC~~
30.0000 mL | Freq: Once | ORAL | Status: AC
  Administered 2017-09-20: 30 mL via ORAL

## 2017-09-20 MED ORDER — NICOTINE 7 MG/24HR TD PT24: 7.0000 mg | MEDICATED_PATCH | Freq: Every day | TRANSDERMAL | 0 refills | Status: DC

## 2017-09-20 MED ORDER — LABETALOL HCL 100 MG PO TABS: 100.0000 mg | ORAL_TABLET | Freq: Two times a day (BID) | ORAL | 0 refills | Status: DC

## 2017-09-20 NOTE — Assessment & Plan Note (Signed)
Patient is in the active phase of smoking cessation. She reports having failed nicotine gum and an oral medication in the past. She would like to try nicotine patches, which I prescribed at 7 mg daily. We reviewed use of the nicotine patches. She will follow up with her primary care doctor for further needs.

## 2017-09-20 NOTE — Assessment & Plan Note (Signed)
Patient experiencing increased lower extremity swelling with Norvasc. It is unclear if this is secondary to Norvasc versus secondary to limitation of diuretic. Her physical exam did not reveal significant lower extremity swelling. She has quite a bit of adiposity on exam. Rather than give her a diuretic to combat the possible side effects of current anti-hypertensive agent, we discussed consideration for switching to an alternative. We reviewed ACE inhibitor versus beta blockers versus hydralazine. Patient wished to switch medications. Her medication was changed to labetalol 100 mg twice daily. She will follow-up in the next 1-2 weeks for recheck of blood pressure and titration of medication.

## 2017-09-20 NOTE — Assessment & Plan Note (Signed)
Patient interested in bariatric surgery. She also has what looks to be on chest x-ray a hiatal hernia. We reviewed referral to GI versus general surgery for consideration of surgical repair. Referral was placed today to Dr. Sheliah Hatch in [place] for discussion of bariatric surgery and possible hiatal hernia repair.

## 2017-09-20 NOTE — Progress Notes (Signed)
Subjective: CC:Amber edema PCP: Johna Sheriff, MD JWJ:XBJYNWG L Colon is a 36 y.o. female presenting to clinic today for:  1. Amber edema Patient reports gradual return of lower extremity edema that she experienced prior to starting HCTZ. She notes that looks extremity edema started after she started taking Norvasc. She has been elevating her lower extremities when resting with some improvement. She denies pain in her, increased calf girth, redness, tachycardia, orthopnea. She reports that shortness of breath with exertion is baseline. She is having some generalized chest pain that started this morning after drinking a pumpkin latte.  2. Chest pain She notes that chest pain started this morning after drinking a pumpkin latte. She reports that she had actually come off of caffeine completely once she was diagnosed with anxiety. She drank a Dr. Reino Kent last night and had a coffee which is atypical for her. She notes associated bloating. No diaphoresis, nausea, vomiting, visual disturbance, headache. She takes omeprazole 20 mg daily. Past family history significant for early cardiac disease in her mother and her father both in their 60s. Both required stents in their 40s. She is never seen a cardiologist.  3. Right foot pain Patient reports onset of right lateral foot pain that radiates to her heel a few days ago. She reports swelling as above but no focal swelling in the foot. No redness. No preceding injury. She often wears flat shoes/belly flat. No numbness or tingling to the feet. She has not tried anything for the pain.  4. Tobacco use disorder Patient reports she smokes approximately one pack every 4 days since 2008. She denies cough. She reports baseline dyspnea on exertion but no increased shortness of breath while seated. No hemoptysis. She has tried nicotine gum and an unnamed oral medication which she did not tolerate well. She has never tried nicotine patches. She does not wake up in the  middle the night to smoke. She is wanting to come off of tobacco.  5. Morbid obesity Patient notes that she's had a discussion with her PCP regarding bariatric surgery. She is very interested in this and would like to discuss this further with the surgeon. Comorbidities include hypertension.  Allergies  Allergen Reactions  . Amoxicillin Diarrhea and Nausea And Vomiting   Past Medical History:  Diagnosis Date  . Anemia    not taking her iron pills either  . Anxiety   . Depression   . GERD (gastroesophageal reflux disease)   . Headache    "occasional"  . History of blood transfusion    blood after miscarrige  . Hypertension    doesn't take her bp pills  . Hypothyroidism    again, takes no med  . Panic attack    Family History  Problem Relation Age of Onset  . Diabetes Mother   . Heart disease Mother   . Cancer Mother        ovarian  . Diabetes Father   . COPD Father   . Heart disease Father    Social Hx: daily smoker.Current medications reviewed.   ROS: Per HPI  Objective: Office vital signs reviewed. BP (!) 132/97   Pulse 95   Temp 98.3 F (36.8 C) (Oral)   Ht  (1.676 m)   Wt (!) 324 lb (147 kg)   BMI 52.29 kg/m   Physical Examination:  General: Awake, alert, morbidly obese, No acute distress Cardio: regular rate and rhythm, S1S2 heard, no murmurs appreciated Pulm: clear to auscultation bilaterally, no wheezes,  rhonchi or rales; normal work of breathing on room air GI: obese, soft, +epigastric TTP, non-distended, bowel sounds present x4, no hepatomegaly, no splenomegaly, no masses Extremities: warm, well perfused, trace pedal edema, cyanosis or clubbing; +2 pulses bilaterally; negative Homan's.  Calf girth equal. No increased warmth.  Right foot: Tenderness to palpation along the dorsolateral aspect of the midfoot. There is no palpable bony abnormalities. There is no erythema, ecchymosis or effusions palpated. She has full active range of motion of  bilateral feet MSK: slightly antalgic gait and normal station Skin: dry; intact; no rashes or lesions Neuro: lower extremity light touch sensation grossly intact  Assessment/ Plan: 36 y.o. female   Essential hypertension Patient experiencing increased lower extremity swelling with Norvasc. It is unclear if this is secondary to Norvasc versus secondary to limitation of diuretic. Her physical exam did not reveal significant lower extremity swelling. She has quite a bit of adiposity on exam. Rather than give her a diuretic to combat the possible side effects of current anti-hypertensive agent, we discussed consideration for switching to an alternative. We reviewed ACE inhibitor versus beta blockers versus hydralazine. Patient wished to switch medications. Her medication was changed to labetalol 100 mg twice daily. She will follow-up in the next 1-2 weeks for recheck of blood pressure and titration of medication.  Current smoker Patient is in the active phase of smoking cessation. She reports having failed nicotine gum and an oral medication in the past. She would like to try nicotine patches, which I prescribed at 7 mg daily. We reviewed use of the nicotine patches. She will follow up with her primary care doctor for further needs.  Morbid obesity with BMI of 50.0-59.9, adult [facility]) Patient interested in bariatric surgery. She also has what looks to be on chest x-ray a hiatal hernia. We reviewed referral to GI versus general surgery for consideration of surgical repair. Referral was placed today to Dr. Sheliah Hatch in [place] for discussion of bariatric surgery and possible hiatal hernia repair.  Family history of early CAD  Patient is morbidly obese with BMI of 52.29. She also has hypertension. Family history significant for early cardiac disease.Referral to cardiology for stress testing in wrist stratification.  Chest pain, unspecified type Chest pain resolved with GI cocktail administered here in  office. EKG actually appears improved compared to previous EKG. Inverted T waves have essentially resolved with the exception of V1. No evidence of ischemia on EKG. Personal review of Chest x-ray with borderline enlarged heart, which has been appreciated on previous x-rays and appears stable. No focal findings on chest x-ray. Because of her significant family history of cardiac disease occurring in both parents in their 3s, I have also referred her to cardiology for stress testing and further assessment. Strict return precautions and reasons for emergent evaluation in the emergency department were reviewed with patient. She was good understanding will follow up with her primary care provider as needed. - EKG 12-Lead - DG Chest 2 View; Future - Ambulatory referral to Cardiology - gi cocktail (Maalox,Lidocaine,Donnatal); Take 30 mLs by mouth once.  Right foot pain Her physical exam was significant for focal tenderness over the dorsolateral aspect of the midfoot but otherwise was unremarkable. imaging was obtained. No acute findings on x-ray of foot. I recommended supportive footwear, Tylenol as needed, ice, topical analgesics, and weight loss. If no improvement with conservative therapy, would consider referral to orthopedics. - DG Foot Complete Right; Future   Orders Placed This Encounter  Procedures  . DG Chest  2 View    Standing Status:   Future    Number of Occurrences:   1    Standing Expiration Date:   11/20/2018    Order Specific Question:   Reason for Exam (SYMPTOM  OR DIAGNOSIS REQUIRED)    Answer:   chest pain    Order Specific Question:   Is patient pregnant?    Answer:   No    Order Specific Question:   Preferred imaging location?    Answer:   Internal    Order Specific Question:   Radiology Contrast Protocol - do NOT remove file path    Answer:   \\[email or web address]s.pdf  . DG Foot Complete Right    Standing Status:   Future    Number of  Occurrences:   1    Standing Expiration Date:   11/20/2018    Order Specific Question:   Reason for Exam (SYMPTOM  OR DIAGNOSIS REQUIRED)    Answer:   right lateral foot pain, morbid obesity    Order Specific Question:   Is patient pregnant?    Answer:   No    Order Specific Question:   Preferred imaging location?    Answer:   Internal    Order Specific Question:   Radiology Contrast Protocol - do NOT remove file path    Answer:   \\[email or web address]s.pdf  . Ambulatory referral to Cardiology    Referral Priority:   Routine    Referral Type:   Consultation    Referral Reason:   Specialty Services Required    Requested Specialty:   Cardiology    Number of Visits Requested:   1  . EKG 12-Lead   Meds ordered this encounter  Medications  . labetalol (NORMODYNE) 100 MG tablet    Sig: Take 1 tablet (100 mg total) by mouth 2 (two) times daily.    Dispense:  60 tablet    Refill:  0  . gi cocktail (Maalox,Lidocaine,Donnatal)  . nicotine (NICODERM CQ - DOSED IN MG/24 HR) 7 mg/24hr patch    Sig: Place 1 patch (7 mg total) onto the skin daily.    Dispense:  28 patch    Refill:  0     Amber Karan Hulen Skains, DO [facility] [phone]

## 2017-09-20 NOTE — Patient Instructions (Addendum)
Your EKG looked improved compared to your previous EKG. You had a chest x-ray done today. You will be contacted with this result. I have placed a referral to cardiology for stress testing. This is important considering that you have high blood pressure, have a family history of early heart disease, are smoker, and are overweight. The risk stratify you and can give you further recommendations.  For your right foot pain, I do recommend that you wear supportive shoes (tennis shoes). Weight loss will also help with reduction and foot and knee pain. You may take Tylenol as needed for pain. I will contact you with the results of your x-ray.  Additionally, I would like you to stop her Norvasc. This medication sometimes can lead to swelling in the legs. I want you to start the labetalol. You take this twice a day. Make sure to avoid salt, including hidden salts and prepackaged foods and sodas. Keep your legs elevated above heart level when not active. However, I do encourage increased physical activity and supportive footwear, as this can help. Follow-up in the next 2 weeks for recheck of blood pressure with Dr. Oswaldo Done.   Peripheral Edema Peripheral edema is swelling that is caused by a buildup of fluid. Peripheral edema most often affects the lower legs, ankles, and feet. It can also develop in the arms, hands, and face. The area of the body that has peripheral edema will look swollen. It may also feel heavy or warm. Your clothes may start to feel tight. Pressing on the area may make a temporary dent in your skin. You may not be able to move your arm or leg as much as usual. There are many causes of peripheral edema. It can be a complication of other diseases, such as congestive heart failure, kidney disease, or a problem with your blood circulation. It also can be a side effect of certain medicines. It often happens to women during pregnancy. Sometimes, the cause is not known. Treating the underlying condition  is often the only treatment for peripheral edema. Follow these instructions at home: Pay attention to any changes in your symptoms. Take these actions to help with your discomfort:  Raise (elevate) your legs while you are sitting or lying down.  Move around often to prevent stiffness and to lessen swelling. Do not sit or stand for long periods of time.  Wear support stockings as told by your health care provider.  Follow instructions from your health care provider about limiting salt (sodium) in your diet. Sometimes eating less salt can reduce swelling.  Take over-the-counter and prescription medicines only as told by your health care provider. Your health care provider may prescribe medicine to help your body get rid of excess water (diuretic).  Keep all follow-up visits as told by your health care provider. This is important.  Contact a health care provider if:  You have a fever.  Your edema starts suddenly or is getting worse, especially if you are pregnant or have a medical condition.  You have swelling in only one leg.  You have increased swelling and pain in your legs. Get help right away if:  You develop shortness of breath, especially when you are lying down.  You have pain in your chest or abdomen.  You feel weak.  You faint. This information is not intended to replace advice given to you by your health care provider. Make sure you discuss any questions you have with your health care provider. Document Released: 01/13/2005 Document Revised:  05/10/2016 Document Reviewed: 06/18/2015 Elsevier Interactive Patient Education  [employer].

## 2017-09-20 NOTE — Assessment & Plan Note (Signed)
Patient is morbidly obese with BMI of 52.29. She also has hypertension. Family history significant for early cardiac disease.Referral to cardiology for stress testing in wrist stratification.

## 2017-10-05 ENCOUNTER — Ambulatory Visit (INDEPENDENT_AMBULATORY_CARE_PROVIDER_SITE_OTHER): Payer: Medicaid Other | Admitting: Cardiovascular Disease

## 2017-10-05 ENCOUNTER — Encounter: Payer: Self-pay | Admitting: *Deleted

## 2017-10-05 ENCOUNTER — Encounter: Payer: Self-pay | Admitting: Cardiovascular Disease

## 2017-10-05 VITALS — BP 130/90 | HR 70 | Ht 66.0 in | Wt 327.0 lb

## 2017-10-05 DIAGNOSIS — R079 Chest pain, unspecified: Secondary | ICD-10-CM | POA: Diagnosis not present

## 2017-10-05 DIAGNOSIS — I10 Essential (primary) hypertension: Secondary | ICD-10-CM

## 2017-10-05 DIAGNOSIS — K219 Gastro-esophageal reflux disease without esophagitis: Secondary | ICD-10-CM

## 2017-10-05 DIAGNOSIS — G4730 Sleep apnea, unspecified: Secondary | ICD-10-CM

## 2017-10-05 NOTE — Progress Notes (Signed)
CARDIOLOGY CONSULT NOTE  Patient ID: Amber Colon MRN: [id number] DOB/AGE: 36/02/1981 35 y.o.  Admit date: (Not on file) Primary Physician: Johna SheriffVincent, Carol L, MD Referring Physician: Dr. Nadine CountsGottschalk  Reason for Consultation: Chest pain  HPI: Amber Colon is a 36 y.o. female who is being seen today for the evaluation of chest pain at the request of Delynn FlavinGottschalk, Ashly M, DO.   I have personally reviewed all documentation, labs, radiographic and cardiovascular studies, and independently interpreted all ECG's.  She told her PCP she developed chest pain on 09/20/17 after drinking a pumpkin latte. She also had Dr Reino KentPepper and a coffee the night before.  She has also had lower extremity edema which developed after taking amlodipine.  She is morbidly obese and is considering bariatric surgery. She is a smoker.  Her antihypertensive therapy was switched to labetalol on 09/20/17 by her PCP.  It appears her chest pain resolved after a GI cocktail administered at the PCPs office.  Labs 08/24/17: Normal troponin, hypokalemia with potassium of 3, BUN 9, creatinine 1.07, normal CBC.  Chest x-ray showed no active cardiopulmonary disease on 09/20/17.  She tells me she has had episodic chest pain for several months. It can occur both with and without exertion. It is located in the upper left chest and described as sharp and eventually becomes dull. Episodes last for 1 minute. It is associated with shortness of breath, lightheadedness, and dizziness. She occasionally has palpitations. She denies syncope.  ECG on 09/20/17 which I personally interpreted demonstrated sinus rhythm with incomplete right bundle-branch block, QRS duration 104 ms.  She says she wakes up in the middle night with bad dreams with the feeling of suffocation and coughing like she can't breathe.  She has been smoking 3-4 cigarettes daily for the past 9 years. She was recently prescribed nicotine patches. She smokes when she  is stressed.    Family history: Mother and father both had coronary artery stents in their 5040s. They are both chain smokers.   Allergies  Allergen Reactions  . Amoxicillin Diarrhea and Nausea And Vomiting    Current Outpatient Prescriptions  Medication Sig Dispense Refill  . colestipol (COLESTID) 1 g tablet Take 2 tablets (2 g total) by mouth 2 (two) times daily. 120 tablet 1  . hydrOXYzine (ATARAX/VISTARIL) 25 MG tablet Take 1 tablet (25 mg total) by mouth every 6 (six) hours as needed for anxiety or nausea. 12 tablet 0  . labetalol (NORMODYNE) 100 MG tablet Take 1 tablet (100 mg total) by mouth 2 (two) times daily. 60 tablet 0  . levothyroxine (SYNTHROID, LEVOTHROID) 75 MCG tablet Take 1 tablet (75 mcg total) by mouth daily. 90 tablet 1  . nicotine (NICODERM CQ - DOSED IN MG/24 HR) 7 mg/24hr patch Place 1 patch (7 mg total) onto the skin daily. 28 patch 0  . omeprazole (PRILOSEC) 20 MG capsule TAKE ONE CAPSULE BY MOUTH once DAILY BEFORE  A  MEAL 90 capsule 1  . sertraline (ZOLOFT) 50 MG tablet Take 1 tablet (50 mg total) by mouth daily. 30 tablet 3   No current facility-administered medications for this visit.     Past Medical History:  Diagnosis Date  . Anemia    not taking her iron pills either  . Anxiety   . Depression   . GERD (gastroesophageal reflux disease)   . Headache    "occasional"  . History of blood transfusion    blood after miscarrige  . Hypertension  doesn't take her bp pills  . Hypothyroidism    again, takes no med  . Panic attack     Past Surgical History:  Procedure Laterality Date  . CHOLECYSTECTOMY N/A 09/08/2016   Procedure: LAPAROSCOPIC CHOLECYSTECTOMY WITH INTRAOPERATIVE CHOLANGIOGRAM;  Surgeon: Chevis Pretty III, MD;  Location: [facility] OR;  Service: General;  Laterality: N/A;  . DILATION AND CURETTAGE OF UTERUS    . TUBAL LIGATION    . TYMPANOMASTOIDECTOMY Left 12/24/2015   Procedure: LEFT MODIFIED  RADICAL TYMPANOMASTOIDECTOMY;  Surgeon: Newman Pies,  MD;  Location: [facility] OR;  Service: ENT;  Laterality: Left;  . TYMPANOMASTOIDECTOMY Right 07/07/2016   Procedure: RIGHT TYMPANOMASTOIDECTOMY;  Surgeon: Newman Pies, MD;  Location: [facility] OR;  Service: ENT;  Laterality: Right;    Social History   Social History  . Marital status: Married    Spouse name: N/A  . Number of children: N/A  . Years of education: N/A   Occupational History  . Not on file.   Social History Main Topics  . Smoking status: Current Some Day Smoker    Packs/day: 0.25    Years: 2.00    Types: Cigarettes  . Smokeless tobacco: Never Used     Comment: 3-4 cigs a day  . Alcohol use No  . Drug use: No  . Sexual activity: Not on file     Comment: only smokes about 2 cigrettes a day/trying to quit   Other Topics Concern  . Not on file   Social History Narrative  . No narrative on file      Current Meds  Medication Sig  . colestipol (COLESTID) 1 g tablet Take 2 tablets (2 g total) by mouth 2 (two) times daily.  . hydrOXYzine (ATARAX/VISTARIL) 25 MG tablet Take 1 tablet (25 mg total) by mouth every 6 (six) hours as needed for anxiety or nausea.  Marland Kitchen labetalol (NORMODYNE) 100 MG tablet Take 1 tablet (100 mg total) by mouth 2 (two) times daily.  Marland Kitchen levothyroxine (SYNTHROID, LEVOTHROID) 75 MCG tablet Take 1 tablet (75 mcg total) by mouth daily.  . nicotine (NICODERM CQ - DOSED IN MG/24 HR) 7 mg/24hr patch Place 1 patch (7 mg total) onto the skin daily.  Marland Kitchen omeprazole (PRILOSEC) 20 MG capsule TAKE ONE CAPSULE BY MOUTH once DAILY BEFORE  A  MEAL  . sertraline (ZOLOFT) 50 MG tablet Take 1 tablet (50 mg total) by mouth daily.      Review of systems complete and found to be negative unless listed above in HPI    Physical exam Blood pressure 130/90, pulse 70, height 5\' 6"  (1.676 m), weight (!) 327 lb (148.3 kg), SpO2 98 %. General: NAD Neck: No JVD, no thyromegaly or thyroid nodule.  Lungs: Clear to auscultation bilaterally with normal respiratory effort. CV: Nondisplaced  PMI. Regular rate and rhythm, normal S1/S2, no S3/S4, no murmur.  No peripheral edema.  No carotid bruit.    Abdomen: Soft, nontender, no distention.  Skin: Intact without lesions or rashes.  Neurologic: Alert and oriented x 3.  Psych: Normal affect. Extremities: No clubbing or cyanosis.  HEENT: Normal.   ECG: Most recent ECG reviewed.   Labs: Lab Results  Component Value Date/Time   K 3.0 (L) 08/24/2017 07:42 PM   BUN 9 08/24/2017 07:42 PM   BUN 7 08/12/2017 03:27 PM   CREATININE 1.07 (H) 08/24/2017 07:42 PM   ALT 27 08/12/2016 10:42 AM   TSH 3.920 07/28/2017 10:41 AM   HGB 12.6 08/24/2017 07:42 PM  HGB WILL FOLLOW 02/27/2016 11:52 AM     Lipids: Lab Results  Component Value Date/Time   LDLCALC 159 (H) 01/22/2016 11:55 AM   CHOL 242 (H) 01/22/2016 11:55 AM   TRIG 146 01/22/2016 11:55 AM   HDL 54 01/22/2016 11:55 AM        ASSESSMENT AND PLAN:  1. Chest pain: There are atypical and typical features. Risk factors for heart disease include tobacco use, hypertension, morbid obesity, and family history of premature coronary artery disease. I will obtain an exercise treadmill stress test. I will hold labetalol the morning of the test in order to achieve an adequate heart rate.  2. Hypertension: Elevated diastolic blood pressure. She needs weight loss and is considering bariatric surgery.  3. Morbid obesity: She is considering bariatric surgery.  4. Sleep disordered breathing: She likely has obstructive sleep apnea. Morbid obesity is certainly contributing to this, for which she is considering bariatric surgery. I will obtain a sleep study.  5. GERD: She has been on omeprazole for several years. She said it is not helping at present. Morbid obesity is considering to this. One could consider switching to Protonix.     Disposition: Follow up in 6 weeks.   Signed: Prentice Docker, M.D., F.A.C.C.  10/05/2017, 9:32 AM

## 2017-10-05 NOTE — Patient Instructions (Addendum)
Medication Instructions:  Continue all current medications.  Labwork:  none  Testing/Procedures:  Your physician has requested that you have an exercise tolerance test. For further information please visit [email or web address]. Please also follow instruction sheet, as given.  Office will contact with results via phone or letter.    Follow-Up: 2 months   Any Other Special Instructions Will Be Listed Below (If Applicable). You have been referred to:  [facility]   If you need a refill on your cardiac medications before your next appointment, please call your pharmacy.

## 2017-10-10 ENCOUNTER — Encounter: Payer: Self-pay | Admitting: Pediatrics

## 2017-10-10 ENCOUNTER — Ambulatory Visit (INDEPENDENT_AMBULATORY_CARE_PROVIDER_SITE_OTHER): Payer: Medicaid Other | Admitting: Pediatrics

## 2017-10-10 VITALS — BP 119/82 | HR 74 | Temp 98.3°F | Ht 66.0 in | Wt 323.0 lb

## 2017-10-10 DIAGNOSIS — F410 Panic disorder [episodic paroxysmal anxiety] without agoraphobia: Secondary | ICD-10-CM

## 2017-10-10 DIAGNOSIS — Z6841 Body Mass Index (BMI) 40.0 and over, adult: Secondary | ICD-10-CM | POA: Diagnosis not present

## 2017-10-10 DIAGNOSIS — I10 Essential (primary) hypertension: Secondary | ICD-10-CM | POA: Diagnosis not present

## 2017-10-10 DIAGNOSIS — R0683 Snoring: Secondary | ICD-10-CM

## 2017-10-10 NOTE — Progress Notes (Signed)
  Subjective:   Patient ID: Amber Colon, female    DOB: 07/19/1981, 36 y.o.   MRN: [id number] CC: Follow-up (1 month) HTN HPI: Amber Colon is a 36 y.o. female presenting for Follow-up (1 month)  Anxiety: taking sertraline 50mg  daily, just increased from 25mg  to 50mg  this week Trying to do breathing exercises when she feels panic coming on, no attacks since last visit No thoughts of self harm Not yet set up appt for counseling  Is going through evaluation for weight loss surgery, has seminar for next week  HTN: stopped HCTZ last visit due to hypokalemia, switched to amlodipine No worsening in swelling, no HA No further episodes of chest pain  Has upcoming appt for sleep apnea eval  Relevant past medical, surgical, family and social history reviewed. Allergies and medications reviewed and updated. History  Smoking Status  . Current Some Day Smoker  . Packs/day: 0.25  . Years: 2.00  . Types: Cigarettes  Smokeless Tobacco  . Never Used    Comment: 3-4 cigs a day   ROS: Per HPI   Objective:    BP 119/82   Pulse 74   Temp 98.3 F (36.8 C) (Oral)   Ht 5\' 6"  (1.676 m)   Wt (!) 323 lb (146.5 kg)   BMI 52.13 kg/m   Wt Readings from Last 3 Encounters:  10/10/17 (!) 323 lb (146.5 kg)  10/05/17 (!) 327 lb (148.3 kg)  09/20/17 (!) 324 lb (147 kg)    Gen: NAD, alert, cooperative with exam, NCAT EYES: EOMI, no conjunctival injection, or no icterus ENT: OP without erythema LYMPH: no cervical LAD CV: NRRR, normal S1/S2, no murmur, distal pulses 2+ b/l Resp: CTABL, no wheezes, normal WOB Abd: +BS, soft, NTND. no guarding or organomegaly Ext: No edema, warm Neuro: Alert and oriented  Assessment & Plan:  Amber Colon was seen today for follow-up HTN.  Diagnoses and all orders for this visit:  Panic attacks Improved Cont sertraline, on 50mg  daily now If panic worsens let us know  Morbid obesity with BMI of 50.0-59.9, adult [facility]) Referral for bariatric surg in Cont to  avoid surg eval for sleep apnea  Essential hypertension Well controlled on amlodipine, cont    Follow up plan: 3 mo Rex Krasarol Lyam Provencio, MD [facility]

## 2017-10-11 ENCOUNTER — Other Ambulatory Visit: Payer: Self-pay | Admitting: Family Medicine

## 2017-10-11 DIAGNOSIS — I10 Essential (primary) hypertension: Secondary | ICD-10-CM

## 2017-10-18 ENCOUNTER — Other Ambulatory Visit: Payer: Self-pay | Admitting: Family Medicine

## 2017-10-18 ENCOUNTER — Ambulatory Visit (HOSPITAL_COMMUNITY)
Admission: RE | Admit: 2017-10-18 | Discharge: 2017-10-18 | Disposition: A | Discharge status: Home / Self Care | Payer: Medicaid Other | Source: Ambulatory Visit | Attending: Cardiovascular Disease | Admitting: Cardiovascular Disease

## 2017-10-18 DIAGNOSIS — R079 Chest pain, unspecified: Secondary | ICD-10-CM | POA: Diagnosis not present

## 2017-10-18 LAB — EXERCISE TOLERANCE TEST
CHL CUP MPHR: 185 {beats}/min
CSEPED: 4 min
CSEPPHR: 153 {beats}/min
Estimated workload: 7 METS
Exercise duration (sec): 4 s
Percent HR: 82 %
RPE: 14
Rest HR: 67 {beats}/min

## 2017-10-19 ENCOUNTER — Telehealth: Payer: Self-pay | Admitting: Cardiovascular Disease

## 2017-10-19 ENCOUNTER — Telehealth: Payer: Self-pay | Admitting: *Deleted

## 2017-10-19 DIAGNOSIS — R079 Chest pain, unspecified: Secondary | ICD-10-CM

## 2017-10-19 DIAGNOSIS — R9439 Abnormal result of other cardiovascular function study: Secondary | ICD-10-CM

## 2017-10-19 NOTE — Telephone Encounter (Signed)
Pre-cert Verification for the following procedure   Lexiscan scheduled for 11-8/18

## 2017-10-19 NOTE — Telephone Encounter (Signed)
Notes recorded by Lesle ChrisHill, Najae Rathert G, LPN on 16/10/960410/31/2018 at 12:30 PM EDT Patient notified. She is willing to try the Lexiscan stress test. [facility] & [facility] for scheduling. Copy to pmd. Follow up scheduled for December. ------  Notes recorded by Laqueta LindenKoneswaran, Suresh A, MD on 10/18/2017 at 3:43 PM EDT Unfortunately, did not achieve target HR so ability to detect potential blockages is reduced. Please obtain Lexiscan.

## 2017-10-25 ENCOUNTER — Encounter (HOSPITAL_COMMUNITY): Payer: Self-pay

## 2017-10-27 ENCOUNTER — Ambulatory Visit (HOSPITAL_COMMUNITY)
Admission: RE | Admit: 2017-10-27 | Discharge: 2017-10-27 | Disposition: A | Discharge status: Home / Self Care | Payer: Medicaid Other | Source: Ambulatory Visit | Attending: Cardiovascular Disease | Admitting: Cardiovascular Disease

## 2017-10-27 ENCOUNTER — Ambulatory Visit (HOSPITAL_BASED_OUTPATIENT_CLINIC_OR_DEPARTMENT_OTHER)
Admission: RE | Admit: 2017-10-27 | Discharge: 2017-10-27 | Disposition: A | Discharge status: Home / Self Care | Payer: Medicaid Other | Source: Ambulatory Visit | Attending: Cardiovascular Disease | Admitting: Cardiovascular Disease

## 2017-10-27 DIAGNOSIS — R9439 Abnormal result of other cardiovascular function study: Secondary | ICD-10-CM | POA: Diagnosis not present

## 2017-10-27 DIAGNOSIS — R079 Chest pain, unspecified: Secondary | ICD-10-CM

## 2017-10-27 LAB — NM MYOCAR MULTI W/SPECT W/WALL MOTION / EF
LHR: 0.44
LVDIAVOL: 89 mL (ref 46–106)
LVSYSVOL: 29 mL
Peak HR: 114 {beats}/min
Rest HR: 60 {beats}/min
SDS: 1
SRS: 4
SSS: 5
TID: 1.1

## 2017-10-27 MED ORDER — REGADENOSON 0.4 MG/5ML IV SOLN
INTRAVENOUS | Status: AC
  Administered 2017-10-27: 0.4 mg via INTRAVENOUS
  Filled 2017-10-27: qty 5

## 2017-10-27 MED ORDER — TECHNETIUM TC 99M TETROFOSMIN IV KIT
30.0000 | PACK | Freq: Once | INTRAVENOUS | Status: AC | PRN
  Administered 2017-10-27: 27 via INTRAVENOUS

## 2017-10-27 MED ORDER — TECHNETIUM TC 99M TETROFOSMIN IV KIT
10.0000 | PACK | Freq: Once | INTRAVENOUS | Status: AC | PRN
  Administered 2017-10-27: 8.68 via INTRAVENOUS

## 2017-10-27 MED ORDER — SODIUM CHLORIDE 0.9% FLUSH
INTRAVENOUS | Status: AC
  Administered 2017-10-27: 10 mL via INTRAVENOUS
  Filled 2017-10-27: qty 10

## 2017-10-31 ENCOUNTER — Telehealth: Payer: Self-pay | Admitting: *Deleted

## 2017-10-31 NOTE — Telephone Encounter (Signed)
-----   Message from Laqueta LindenSuresh A Koneswaran, MD sent at 10/28/2017 10:16 AM EST ----- Low risk. No blockages.

## 2017-10-31 NOTE — Telephone Encounter (Signed)
Called patient with test results. No answer. Left message to call back.  

## 2017-11-02 ENCOUNTER — Institutional Professional Consult (permissible substitution): Payer: Medicaid Other | Admitting: Neurology

## 2017-11-03 ENCOUNTER — Encounter: Payer: Self-pay | Admitting: Neurology

## 2017-12-06 ENCOUNTER — Institutional Professional Consult (permissible substitution): Payer: Medicaid Other | Admitting: Neurology

## 2017-12-07 ENCOUNTER — Ambulatory Visit: Payer: Self-pay | Admitting: Cardiovascular Disease

## 2017-12-08 ENCOUNTER — Other Ambulatory Visit: Payer: Self-pay | Admitting: Pediatrics

## 2017-12-08 DIAGNOSIS — Z9049 Acquired absence of other specified parts of digestive tract: Principal | ICD-10-CM

## 2017-12-08 DIAGNOSIS — K219 Gastro-esophageal reflux disease without esophagitis: Secondary | ICD-10-CM

## 2017-12-08 DIAGNOSIS — R197 Diarrhea, unspecified: Secondary | ICD-10-CM

## 2017-12-09 ENCOUNTER — Other Ambulatory Visit: Payer: Self-pay

## 2017-12-09 ENCOUNTER — Encounter (HOSPITAL_COMMUNITY): Payer: Self-pay | Admitting: Emergency Medicine

## 2017-12-09 ENCOUNTER — Emergency Department (HOSPITAL_COMMUNITY): Payer: Medicaid Other

## 2017-12-09 ENCOUNTER — Emergency Department (HOSPITAL_COMMUNITY)
Admission: EM | Admit: 2017-12-09 | Discharge: 2017-12-09 | Disposition: A | Discharge status: Home / Self Care | Payer: Medicaid Other | Attending: Emergency Medicine | Admitting: Emergency Medicine

## 2017-12-09 DIAGNOSIS — Z79899 Other long term (current) drug therapy: Secondary | ICD-10-CM | POA: Insufficient documentation

## 2017-12-09 DIAGNOSIS — R079 Chest pain, unspecified: Secondary | ICD-10-CM | POA: Diagnosis present

## 2017-12-09 DIAGNOSIS — R0789 Other chest pain: Secondary | ICD-10-CM | POA: Insufficient documentation

## 2017-12-09 DIAGNOSIS — F17210 Nicotine dependence, cigarettes, uncomplicated: Secondary | ICD-10-CM | POA: Insufficient documentation

## 2017-12-09 DIAGNOSIS — E039 Hypothyroidism, unspecified: Secondary | ICD-10-CM | POA: Diagnosis not present

## 2017-12-09 DIAGNOSIS — I10 Essential (primary) hypertension: Secondary | ICD-10-CM | POA: Diagnosis not present

## 2017-12-09 LAB — CBC WITH DIFFERENTIAL/PLATELET
BASOS ABS: 0.1 10*3/uL (ref 0.0–0.1)
Basophils Relative: 1 %
EOS ABS: 0.4 10*3/uL (ref 0.0–0.7)
Eosinophils Relative: 6 %
HCT: 34.7 % — ABNORMAL LOW (ref 36.0–46.0)
HEMOGLOBIN: 10.8 g/dL — AB (ref 12.0–15.0)
LYMPHS ABS: 1.6 10*3/uL (ref 0.7–4.0)
LYMPHS PCT: 23 %
MCH: 24.9 pg — AB (ref 26.0–34.0)
MCHC: 31.1 g/dL (ref 30.0–36.0)
MCV: 80.1 fL (ref 78.0–100.0)
Monocytes Absolute: 0.8 10*3/uL (ref 0.1–1.0)
Monocytes Relative: 11 %
NEUTROS PCT: 59 %
Neutro Abs: 4.1 10*3/uL (ref 1.7–7.7)
Platelets: 160 10*3/uL (ref 150–400)
RBC: 4.33 MIL/uL (ref 3.87–5.11)
RDW: 16.4 % — ABNORMAL HIGH (ref 11.5–15.5)
WBC: 6.9 10*3/uL (ref 4.0–10.5)

## 2017-12-09 LAB — BASIC METABOLIC PANEL
ANION GAP: 10 (ref 5–15)
BUN: 10 mg/dL (ref 6–20)
CHLORIDE: 108 mmol/L (ref 101–111)
CO2: 23 mmol/L (ref 22–32)
Calcium: 9.4 mg/dL (ref 8.9–10.3)
Creatinine, Ser: 1.03 mg/dL — ABNORMAL HIGH (ref 0.44–1.00)
GFR calc Af Amer: >60 mL/min (ref >60)
Glucose, Bld: 92 mg/dL (ref 65–99)
POTASSIUM: 3.7 mmol/L (ref 3.5–5.1)
SODIUM: 141 mmol/L (ref 135–145)

## 2017-12-09 LAB — TROPONIN I: Troponin I: <0.03 ng/mL (ref <0.03)

## 2017-12-09 MED ORDER — LORAZEPAM 1 MG PO TABS
1.0000 mg | ORAL_TABLET | Freq: Once | ORAL | Status: AC
  Administered 2017-12-09: 1 mg via ORAL
  Filled 2017-12-09: qty 1

## 2017-12-09 NOTE — ED Provider Notes (Signed)
[facility]ERGENCY DEPARTMENT Provider Note   CSN: [id number] Arrival date & time: 12/09/17  1645     History   Chief Complaint Chief Complaint  Patient presents with  . Chest Pain    HPI Amber Colon is a 36 y.o. female.   Patient complains of chest discomfort today.  Patient states that she had a stress test done last month that looked okay.   The history is provided by the patient. No language interpreter was used.  Chest Pain   This is a new problem. The current episode started 12 to 24 hours ago. The problem occurs rarely. The problem has been resolved. The pain is associated with movement. The pain is present in the substernal region. The pain is at a severity of 5/10. The pain is moderate. The quality of the pain is described as dull. Pertinent negatives include no abdominal pain, no back pain, no cough and no headaches.  Pertinent negatives for past medical history include no seizures.    Past Medical History:  Diagnosis Date  . Anemia    not taking her iron pills either  . Anxiety   . Depression   . GERD (gastroesophageal reflux disease)   . Headache    "occasional"  . History of blood transfusion    blood after miscarrige  . Hypertension    doesn't take her bp pills  . Hypothyroidism    again, takes no med  . Panic attack     Patient Active Problem List   Diagnosis Date Noted  . Family history of early CAD 09/20/2017  . 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 Depression 12/24/2016  . S/P cholecystectomy 09/23/2016  . Current smoker 02/27/2016  . GAD (generalized anxiety disorder) 01/22/2016  . GERD (gastroesophageal reflux disease) 01/22/2016  . Morbid obesity with BMI of 50.0-59.9, adult (HCC) 01/22/2016  . Metabolic syndrome 01/22/2016  . Hypothyroidism 06/18/2015  . Anemia, iron deficiency 06/18/2015  . Essential hypertension 06/18/2015    Past Surgical History:  Procedure Laterality Date  . CHOLECYSTECTOMY N/A 09/08/2016   Procedure: LAPAROSCOPIC CHOLECYSTECTOMY WITH INTRAOPERATIVE CHOLANGIOGRAM;  Surgeon: Chevis PrettyPaul Toth III, MD;  Location: [facility] OR;  Service: General;  Laterality: N/A;  . DILATION AND CURETTAGE OF UTERUS    . TUBAL LIGATION    . TYMPANOMASTOIDECTOMY Left 12/24/2015   Procedure: LEFT MODIFIED  RADICAL TYMPANOMASTOIDECTOMY;  Surgeon: Newman PiesSu Teoh, MD;  Location: [facility] OR;  Service: ENT;  Laterality: Left;  . TYMPANOMASTOIDECTOMY Right 07/07/2016   Procedure: RIGHT TYMPANOMASTOIDECTOMY;  Surgeon: Newman PiesSu Teoh, MD;  Location: [facility] OR;  Service: ENT;  Laterality: Right;    OB History    Gravida Para Term Preterm AB Living   4         3   SAB TAB Ectopic Multiple Live Births                   Home Medications    Prior to Admission medications   Medication Sig Start Date End Date Taking? Authorizing Provider  colestipol (COLESTID) 1 g tablet TAKE 2 TABLETS BY MOUTH TWICE DAILY 12/08/17  Yes Johna SheriffVincent, Carol L, MD  labetalol (NORMODYNE) 100 MG tablet TAKE 1 TABLET BY MOUTH TWICE DAILY 10/12/17  Yes Johna SheriffVincent, Carol L, MD  levothyroxine (SYNTHROID, LEVOTHROID) 75 MCG tablet Take 1 tablet (75 mcg total) by mouth daily. 07/28/17  Yes Johna SheriffVincent, Carol L, MD  omeprazole (PRILOSEC) 20 MG capsule TAKE 1 CAPSULE BY MOUTH ONCE DAILY BEFORE  A  MEAL 12/08/17  Yes  Johna Sheriff, MD  hydrOXYzine (ATARAX/VISTARIL) 25 MG tablet Take 1 tablet (25 mg total) by mouth every 6 (six) hours as needed for anxiety or nausea. Patient not taking: Reported on 12/09/2017 08/24/17   Samuel Jester, DO  sertraline (ZOLOFT) 50 MG tablet Take 1 tablet (50 mg total) by mouth daily. Patient not taking: Reported on 12/09/2017 09/09/17   Johna Sheriff, MD    Family History Family History  Problem Relation Age of Onset  . Diabetes Mother   . Heart disease Mother   . Cancer Mother        ovarian  . Diabetes Father   . COPD Father   . Heart disease Father     Social History Social History   Tobacco Use  . Smoking status: Current Some Day  Smoker    Packs/day: 0.25    Years: 2.00    Pack years: 0.50    Types: Cigarettes  . Smokeless tobacco: Never Used  . Tobacco comment: 3-4 cigs a day  Substance Use Topics  . Alcohol use: No  . Drug use: No     Allergies   Amoxicillin   Review of Systems Review of Systems  Constitutional: Negative for appetite change and fatigue.  HENT: Negative for congestion, ear discharge and sinus pressure.   Eyes: Negative for discharge.  Respiratory: Negative for cough.   Cardiovascular: Positive for chest pain.  Gastrointestinal: Negative for abdominal pain and diarrhea.  Genitourinary: Negative for frequency and hematuria.  Musculoskeletal: Negative for back pain.  Skin: Negative for rash.  Neurological: Negative for seizures and headaches.  Psychiatric/Behavioral: Negative for hallucinations.     Physical Exam Updated Vital Signs BP 115/80   Pulse 77   Temp 98.7 F (37.1 C) (Oral)   Resp 20   LMP 11/22/2017   SpO2 97%   Physical Exam  Constitutional: She is oriented to person, place, and time. She appears well-developed.  HENT:  Head: Normocephalic.  Eyes: Conjunctivae and EOM are normal. No scleral icterus.  Neck: Neck supple. No thyromegaly present.  Cardiovascular: Normal rate and regular rhythm. Exam reveals no gallop and no friction rub.  No murmur heard. Pulmonary/Chest: No stridor. She has no wheezes. She has no rales. She exhibits no tenderness.  Abdominal: She exhibits no distension. There is no tenderness. There is no rebound.  Musculoskeletal: Normal range of motion. She exhibits no edema.  Lymphadenopathy:    She has no cervical adenopathy.  Neurological: She is oriented to person, place, and time. She exhibits normal muscle tone. Coordination normal.  Skin: No rash noted. No erythema.  Psychiatric: She has a normal mood and affect. Her behavior is normal.     ED Treatments / Results  Labs (all labs ordered are listed, but only abnormal results are  displayed) Labs Reviewed  CBC WITH DIFFERENTIAL/PLATELET - Abnormal; Notable for the following components:      Result Value   Hemoglobin 10.8 (*)    HCT 34.7 (*)    MCH 24.9 (*)    RDW 16.4 (*)    All other components within normal limits  BASIC METABOLIC PANEL - Abnormal; Notable for the following components:   Creatinine, Ser 1.03 (*)    All other components within normal limits  TROPONIN I  TROPONIN I    EKG  EKG Interpretation  Date/Time:  Friday December 09 2017 16:52:41 EST Ventricular Rate:  74 PR Interval:    QRS Duration: 100 QT Interval:  418 QTC Calculation: 464 R  Axis:   61 Text Interpretation:  Sinus rhythm Low voltage, precordial leads Abnormal inferior Q waves Nonspecific T abnormalities, anterior leads Confirmed by Bethann BerkshireZammit, Vee Bahe [phone]([id number]) on 12/09/2017 5:39:00 PM Also confirmed by Bethann BerkshireZammit, Qamar Aughenbaugh [phone]([id number])  on 12/09/2017 9:42:25 PM       Radiology Dg Chest 2 View  Result Date: 12/09/2017 CLINICAL DATA:  Chest pain. EXAM: CHEST  2 VIEW COMPARISON:  09/20/2017 FINDINGS: The heart size and mediastinal contours are within normal limits. Both lungs are clear. The visualized skeletal structures are unremarkable. IMPRESSION: No active cardiopulmonary disease. Electronically Signed   By: Francene BoyersJames  Maxwell M.D.   On: 12/09/2017 17:42    Procedures Procedures (including critical care time)  Medications Ordered in ED Medications  LORazepam (ATIVAN) tablet 1 mg (1 mg Oral Given 12/09/17 1840)     Initial Impression / Assessment and Plan / ED Course  I have reviewed the triage vital signs and the nursing notes.  Pertinent labs & imaging results that were available during my care of the patient were reviewed by me and considered in my medical decision making (see chart for details).     Patient with atypical chest pain.  Also 2- troponins.  Patient also has normal cardiac stress test done 1 month ago.  EKG  does show some nonspecific changes but they were there  before.  Patient is placed on aspirin today and told to follow-up with PCP  Final Clinical Impressions(s) / ED Diagnoses   Final diagnoses:  Atypical chest pain    ED Discharge Orders    None       Bethann BerkshireZammit, Cashe Gatt, MD 12/09/17 2156

## 2017-12-09 NOTE — Discharge Instructions (Signed)
Take a baby aspirin a day and follow-up with your family doctor in a week for recheck return if any problems

## 2017-12-09 NOTE — ED Triage Notes (Signed)
Pt called ems due to cp. Ems gave one ntg and 4 baby asa. Pt arrived a/o. Nad. Non diaphoretic.

## 2017-12-09 NOTE — ED Notes (Signed)
Pt states had sudden chest pressure that last . Was given ntg x2 sl and states pain decreased to 2 and is now just dull with some intermittent sharp pains.

## 2017-12-15 ENCOUNTER — Ambulatory Visit: Payer: Medicaid Other | Admitting: Cardiovascular Disease

## 2017-12-15 ENCOUNTER — Encounter: Payer: Self-pay | Admitting: Cardiovascular Disease

## 2017-12-15 VITALS — BP 122/74 | HR 68 | Ht 66.0 in | Wt 315.2 lb

## 2017-12-15 DIAGNOSIS — G4730 Sleep apnea, unspecified: Secondary | ICD-10-CM

## 2017-12-15 DIAGNOSIS — K219 Gastro-esophageal reflux disease without esophagitis: Secondary | ICD-10-CM

## 2017-12-15 DIAGNOSIS — R079 Chest pain, unspecified: Secondary | ICD-10-CM

## 2017-12-15 DIAGNOSIS — I10 Essential (primary) hypertension: Secondary | ICD-10-CM

## 2017-12-15 DIAGNOSIS — Z9289 Personal history of other medical treatment: Secondary | ICD-10-CM | POA: Diagnosis not present

## 2017-12-15 NOTE — Progress Notes (Signed)
SUBJECTIVE: The patient returns for follow-up after undergoing cardiovascular testing performed for the evaluation of chest pain and sleep disordered breathing.  She did not reach her target heart rate with exercise treadmill stress testing.  I then obtained a nuclear stress test.  It was a low risk study with no significant ischemic territories.  She has lost 12 pounds since her last visit with me.  She had another episode of chest pain and was evaluated in the ED on 12/09/17.  I personally reviewed all relevant documentation, labs, and studies.  Troponins were normal.  Chest x-ray was normal.  She was mildly anemic with a hemoglobin of 10.8.  She tells me she had a lot of caffeine and was under a lot of stress that day and thinks that may have caused her to have chest pain.   Family history: Mother and father both had coronary artery stents in their 3240s. They are both chain smokers.   Review of Systems: As per "subjective", otherwise negative.  Allergies  Allergen Reactions  . Amoxicillin Diarrhea and Nausea And Vomiting    Has patient had a PCN reaction causing immediate rash, facial/tongue/throat swelling, SOB or lightheadedness with hypotension: No Has patient had a PCN reaction causing severe rash involving mucus membranes or skin necrosis: No Has patient had a PCN reaction that required hospitalization: No Has patient had a PCN reaction occurring within the last 10 years: No If all of the above answers are "NO", then may proceed with Cephalosporin use.     Current Outpatient Medications  Medication Sig Dispense Refill  . colestipol (COLESTID) 1 g tablet TAKE 2 TABLETS BY MOUTH TWICE DAILY 120 tablet 0  . labetalol (NORMODYNE) 100 MG tablet TAKE 1 TABLET BY MOUTH TWICE DAILY 180 tablet 0  . levothyroxine (SYNTHROID, LEVOTHROID) 75 MCG tablet Take 1 tablet (75 mcg total) by mouth daily. 90 tablet 1  . omeprazole (PRILOSEC) 20 MG capsule TAKE 1 CAPSULE BY MOUTH ONCE  DAILY BEFORE  A  MEAL 90 capsule 0   No current facility-administered medications for this visit.     Past Medical History:  Diagnosis Date  . Anemia    not taking her iron pills either  . Anxiety   . Depression   . GERD (gastroesophageal reflux disease)   . Headache    "occasional"  . History of blood transfusion    blood after miscarrige  . Hypertension    doesn't take her bp pills  . Hypothyroidism    again, takes no med  . Panic attack     Past Surgical History:  Procedure Laterality Date  . CHOLECYSTECTOMY N/A 09/08/2016   Procedure: LAPAROSCOPIC CHOLECYSTECTOMY WITH INTRAOPERATIVE CHOLANGIOGRAM;  Surgeon: Chevis PrettyPaul Toth III, MD;  Location: [facility] OR;  Service: General;  Laterality: N/A;  . DILATION AND CURETTAGE OF UTERUS    . TUBAL LIGATION    . TYMPANOMASTOIDECTOMY Left 12/24/2015   Procedure: LEFT MODIFIED  RADICAL TYMPANOMASTOIDECTOMY;  Surgeon: Newman PiesSu Teoh, MD;  Location: [facility] OR;  Service: ENT;  Laterality: Left;  . TYMPANOMASTOIDECTOMY Right 07/07/2016   Procedure: RIGHT TYMPANOMASTOIDECTOMY;  Surgeon: Newman PiesSu Teoh, MD;  Location: [facility] OR;  Service: ENT;  Laterality: Right;    Social History   Socioeconomic History  . Marital status: Married    Spouse name: Not on file  . Number of children: Not on file  . Years of education: Not on file  . Highest education level: Not on file  Social Needs  .  Financial resource strain: Not on file  . Food insecurity - worry: Not on file  . Food insecurity - inability: Not on file  . Transportation needs - medical: Not on file  . Transportation needs - non-medical: Not on file  Occupational History  . Not on file  Tobacco Use  . Smoking status: Current Some Day Smoker    Packs/day: 0.25    Years: 2.00    Pack years: 0.50    Types: Cigarettes  . Smokeless tobacco: Never Used  . Tobacco comment: 3-4 cigs a day  Substance and Sexual Activity  . Alcohol use: No  . Drug use: No  . Sexual activity: Not on file    Comment: only smokes about  2 cigrettes a day/trying to quit  Other Topics Concern  . Not on file  Social History Narrative  . Not on file     Vitals:   12/15/17 1501  BP: 122/74  Pulse: 68  Weight: (!) 315 lb 3.2 oz (143 kg)  Height: 5\' 6"  (1.676 m)    Wt Readings from Last 3 Encounters:  12/15/17 (!) 315 lb 3.2 oz (143 kg)  10/10/17 (!) 323 lb (146.5 kg)  10/05/17 (!) 327 lb (148.3 kg)     PHYSICAL EXAM General: NAD HEENT: Normal. Neck: No JVD, no thyromegaly. Lungs: Clear to auscultation bilaterally with normal respiratory effort. CV: Regular rate and rhythm, normal S1/S2, no S3/S4, no murmur. No pretibial or periankle edema.  No carotid bruit.   Abdomen: Soft, nontender, protuberant.  Neurologic: Alert and oriented.  Psych: Normal affect. Skin: Normal. Musculoskeletal: No gross deformities.    ECG: Most recent ECG reviewed.   Labs: Lab Results  Component Value Date/Time   K 3.7 12/09/2017 05:10 PM   BUN 10 12/09/2017 05:10 PM   BUN 7 08/12/2017 03:27 PM   CREATININE 1.03 (H) 12/09/2017 05:10 PM   ALT 27 08/12/2016 10:42 AM   TSH 3.920 07/28/2017 10:41 AM   HGB 10.8 (L) 12/09/2017 05:10 PM   [facility] WILL FOLLOW 02/27/2016 11:52 AM     Lipids: Lab Results  Component Value Date/Time   LDLCALC 159 (H) 01/22/2016 11:55 AM   CHOL 242 (H) 01/22/2016 11:55 AM   TRIG 146 01/22/2016 11:55 AM   HDL 54 01/22/2016 11:55 AM       ASSESSMENT AND PLAN:  1. Chest pain: Nuclear stress test was low risk as detailed above. Risk factors for heart disease include tobacco use, hypertension, morbid obesity, and family history of premature coronary artery disease.  I encouraged continued weight loss.  Symptoms are atypical.  No further cardiac testing is indicated at this time.  2. Hypertension: Blood pressure is normal.  No changes to therapy.  3. Morbid obesity: She is considering bariatric surgery.  4. Sleep disordered breathing: She likely has obstructive sleep apnea. Morbid obesity is  certainly contributing to this, for which she is considering bariatric surgery. I previously ordered a sleep study.  She is scheduled to see a sleep specialist in January 2019.  5. GERD: She has been on omeprazole for several years. She said it is not helping at present. Morbid obesity is considering to this. One could consider switching to Protonix.     Disposition: Follow up with me as needed   Prentice DockerSuresh Kaeli Nichelson, M.D., F.A.C.C.

## 2017-12-15 NOTE — Patient Instructions (Signed)
Your physician recommends that you schedule a follow-up appointment in: AS NEEDED WITH DR KONESWARAN  Your physician recommends that you continue on your current medications as directed. Please refer to the Current Medication list given to you today.  Thank you for choosing [facility]!!    

## 2017-12-29 ENCOUNTER — Encounter: Payer: Self-pay | Admitting: Pediatrics

## 2017-12-29 ENCOUNTER — Encounter: Payer: Self-pay | Admitting: Gastroenterology

## 2017-12-29 ENCOUNTER — Ambulatory Visit: Payer: Medicaid Other | Admitting: Pediatrics

## 2017-12-29 VITALS — BP 115/78 | HR 85 | Temp 98.7°F | Ht 66.0 in | Wt 310.2 lb

## 2017-12-29 DIAGNOSIS — F410 Panic disorder [episodic paroxysmal anxiety] without agoraphobia: Secondary | ICD-10-CM

## 2017-12-29 DIAGNOSIS — F17200 Nicotine dependence, unspecified, uncomplicated: Secondary | ICD-10-CM | POA: Diagnosis not present

## 2017-12-29 DIAGNOSIS — F329 Major depressive disorder, single episode, unspecified: Secondary | ICD-10-CM

## 2017-12-29 DIAGNOSIS — F32A Depression, unspecified: Secondary | ICD-10-CM

## 2017-12-29 DIAGNOSIS — K219 Gastro-esophageal reflux disease without esophagitis: Secondary | ICD-10-CM | POA: Diagnosis not present

## 2017-12-29 DIAGNOSIS — I10 Essential (primary) hypertension: Secondary | ICD-10-CM | POA: Diagnosis not present

## 2017-12-29 MED ORDER — NICOTINE 14 MG/24HR TD PT24: 14.0000 mg | MEDICATED_PATCH | Freq: Every day | TRANSDERMAL | 3 refills | Status: DC

## 2017-12-29 MED ORDER — PANTOPRAZOLE SODIUM 40 MG PO TBEC: 40.0000 mg | DELAYED_RELEASE_TABLET | Freq: Every day | ORAL | 3 refills | Status: DC

## 2017-12-29 MED ORDER — FLUOXETINE HCL 20 MG PO TABS: ORAL_TABLET | ORAL | 3 refills | Status: DC

## 2017-12-29 MED ORDER — HYDROXYZINE HCL 25 MG PO TABS: 25.0000 mg | ORAL_TABLET | Freq: Three times a day (TID) | ORAL | 3 refills | Status: DC | PRN

## 2017-12-29 NOTE — Patient Instructions (Signed)
Your provider wants you to schedule an appointment with a Psychologist/Psychiatrist. The following list of offices requires the patient to call and make their own appointment, as there is information they need that only you can provide. Please feel free to choose form the following providers:  [facility]   [phone] Crisis Recovery in [place] [phone]  [facility]  [phone]   [address] [place], [place]  (Scheduled through [facility]) Must call and do an interview for appointment. Sees Children / Accepts Medicaid  [facility]    [phone]  [address], [address]    [place], [place]       [facility]  [phone] [address] [place], [place]  Evaluates for Autism but does not treat it Sees Children / Accepts Medicaid  [facility]    [phone] [address], [address]   [place], [place] Medication management, substance abuse, bipolar, grief, family, marriage, OCD, anxiety, PTSD Sees children / Accepts Medicaid  [facility]    [phone] [address] [place], [place] Sees children / Accepts Medicaid  [facility]  [phone] [address] [place], [place]   Dr Akinlayo     [phone] [address] [place], [place]  Sees ADD & ADHD for treatment Accepts Medicaid  [facility]  [phone] [address] [place], [place] Evaluates for Autism Accepts Medicaid  [facility]  [phone] [address] [place], [place]  Does Adult ADD evaluations Does not accept Medicaid  [facility]   [phone] [address]   , [place] Uses animal therapy  Sees children as young as 3 years old Accepts Medicaid  [facility]     [phone]    [address]  [place], [place] [address] Sees children Accepts Medicaid  

## 2017-12-29 NOTE — Progress Notes (Signed)
  Subjective:   Patient ID: Amber Colon, female    DOB: 04/25/1981, 37 y.o.   MRN: [id number] CC: Nausea; Anxiety; and Abdominal Pain (Lower, left)  HPI: Amber Colon is a 37 y.o. female presenting for Nausea; Anxiety; and Abdominal Pain (Lower, left)  Here today with her daughter Amber Colon  Less eating out  Walking more Is pleased with her weight loss  Anxiety: symptoms ongoing Having panic attacks Sleeping ok  GERD: spicy foods and greasy foods make epigastric pain worse Eating at home Has had gallbladder removed  Tobacco use: smoking partial pack a day, thinks more recently Wants more than 7mg  patches  Husband's immigration case coming up, she is hoping he will make it back from [place] where he has been for apprx 3 years  Relevant past medical, surgical, family and social history reviewed. Allergies and medications reviewed and updated. Social History   Tobacco Use  Smoking Status Current Some Day Smoker  . Packs/day: 0.25  . Years: 2.00  . Pack years: 0.50  . Types: Cigarettes  Smokeless Tobacco Never Used  Tobacco Comment   3-4 cigs a day   ROS: Per HPI   Objective:    BP 115/78   Pulse 85   Temp 98.7 F (37.1 C) (Oral)   Ht 5\' 6"  (1.676 m)   Wt (!) 310 lb 3.2 oz (140.7 kg)   BMI 50.07 kg/m   Wt Readings from Last 3 Encounters:  12/29/17 (!) 310 lb 3.2 oz (140.7 kg)  12/15/17 (!) 315 lb 3.2 oz (143 kg)  10/10/17 (!) 323 lb (146.5 kg)    Gen: NAD, alert, cooperative with exam, NCAT EYES: EOMI, no conjunctival injection, or no icterus ENT:  TMs pearly gray b/l, OP without erythema LYMPH: no cervical LAD CV: NRRR, normal S1/S2, no murmur, distal pulses 2+ b/l Resp: CTABL, no wheezes, normal WOB Abd: +BS, soft, NTND. no guarding or organomegaly Ext: No edema, warm Neuro: Alert and oriented, strength equal b/l UE and LE, coordination grossly normal MSK: normal muscle bulk  Assessment & Plan:  Indy was seen today for nausea, anxiety and  abdominal pain.  Diagnoses and all orders for this visit:  Panic attacks Start Prozac, use hydroxyzine as needed, patient open to counseling -     Ambulatory referral to Psychiatry -     FLUoxetine (PROZAC) 20 MG tablet; Take 1 tab in the morning for 2 weeks, then take two tabs daily -     hydrOXYzine (ATARAX/VISTARIL) 25 MG tablet; Take 1 tablet (25 mg total) by mouth 3 (three) times daily as needed.  Current smoker Discussed cessation strategies -     nicotine (NICODERM CQ - DOSED IN MG/24 HOURS) 14 mg/24hr patch; Place 1 patch (14 mg total) onto the skin daily.  Essential hypertension Well controlled today, continue labetalol  Depression, unspecified depression type Starting Prozac as above, mood has been better in the last few weeks but still has good days and bad days.  Gastroesophageal reflux disease, esophagitis presence not specified Ongoing symptoms despite regular PPI use -     Ambulatory referral to Gastroenterology -     pantoprazole (PROTONIX) 40 MG tablet; Take 1 tablet (40 mg total) by mouth daily.   Follow up plan: Return in about 6 weeks (around 02/09/2018). Amber Krasarol Laparis Durrett, MD [facility]

## 2018-01-02 ENCOUNTER — Ambulatory Visit (INDEPENDENT_AMBULATORY_CARE_PROVIDER_SITE_OTHER): Payer: Medicaid Other | Admitting: Otolaryngology

## 2018-01-02 DIAGNOSIS — H95123 Granulation of postmastoidectomy cavity, bilateral ears: Secondary | ICD-10-CM | POA: Diagnosis not present

## 2018-01-05 ENCOUNTER — Ambulatory Visit: Payer: Medicaid Other | Admitting: Neurology

## 2018-01-05 ENCOUNTER — Encounter: Payer: Self-pay | Admitting: Neurology

## 2018-01-05 VITALS — BP 124/76 | HR 64 | Ht 66.0 in | Wt 314.0 lb

## 2018-01-05 DIAGNOSIS — Z6841 Body Mass Index (BMI) 40.0 and over, adult: Secondary | ICD-10-CM | POA: Diagnosis not present

## 2018-01-05 DIAGNOSIS — Z820 Family history of epilepsy and other diseases of the nervous system: Secondary | ICD-10-CM

## 2018-01-05 DIAGNOSIS — R51 Headache: Secondary | ICD-10-CM

## 2018-01-05 DIAGNOSIS — G4719 Other hypersomnia: Secondary | ICD-10-CM | POA: Diagnosis not present

## 2018-01-05 DIAGNOSIS — R519 Headache, unspecified: Secondary | ICD-10-CM

## 2018-01-05 DIAGNOSIS — R0683 Snoring: Secondary | ICD-10-CM | POA: Diagnosis not present

## 2018-01-05 NOTE — Progress Notes (Signed)
Subjective:    Patient ID: Amber Colon is a 37 y.o. female.  HPI     Huston Foley, MD, PhD [facility] [address], [address] [address] [place], [place] [address]  Dear Amber Colon,    I saw your patient, Amber Colon, upon your kind request in my neurologic clinic today for initial consultation of her sleep disorder, in particular, concern for underlying obstructive sleep apnea. The patient is accompanied by her son today. Of note, patient no showed for an appointment on 03/03/2017, at which time she was referred by her primary care physician for sleep evaluation and on 11/02/17. She also canceled an appointment on 12/06/2017, hence the delay in seeing her. As you know, Ms. Apple is a 37 year old right-handed woman with an underlying medical history of irritable bowel syndrome, hypertension, hypothyroidism, depression, anxiety, reflux disease, anemia, smoking and morbid obesity with a BMI of over 50, who reports snoring, and witnessed apneas while asleep, as well as daytime somnolence. I reviewed your office note from 10/05/17. Her Epworth sleepiness score is 11 out of 24, fatigue score is 37 out of 63. She is married and lives with her children, husband is currently in [place]. She does not work. She smokes one pack every 3-4 days. She does not utilize alcohol, drinks caffeine on a daily basis in the form of soda, 1-2 servings per day on average. She is trying to quit smoking. She is trying to cut back on caffeine. She is trying to lose weight. Her bedtime is between 9 and 10, wakeup time between 8 and 9. She does not have night to night nocturia, denies restless leg symptoms or leg movements. She is not sure how bad her snoring is, her son endorses that she snores. She has woken herself up with a sense of gasping for air and has woken up coughing. She has had morning headaches occasionally. Her mother and father both have sleep apnea and uses CPAP machines. She may have a  maternal aunt with a CPAP machine.  Her Past Medical History Is Significant For: Past Medical History:  Diagnosis Date  . Anemia    not taking her iron pills either  . Anxiety   . Depression   . GERD (gastroesophageal reflux disease)   . Headache    "occasional"  . History of blood transfusion    blood after miscarrige  . Hypertension    doesn't take her bp pills  . Hypothyroidism    again, takes no med  . Panic attack     Her Past Surgical History Is Significant For: Past Surgical History:  Procedure Laterality Date  . CHOLECYSTECTOMY N/A 09/08/2016   Procedure: LAPAROSCOPIC CHOLECYSTECTOMY WITH INTRAOPERATIVE CHOLANGIOGRAM;  Surgeon: Chevis Pretty III, MD;  Location: [facility] OR;  Service: General;  Laterality: N/A;  . DILATION AND CURETTAGE OF UTERUS    . TUBAL LIGATION    . TYMPANOMASTOIDECTOMY Left 12/24/2015   Procedure: LEFT MODIFIED  RADICAL TYMPANOMASTOIDECTOMY;  Surgeon: Newman Pies, MD;  Location: [facility] OR;  Service: ENT;  Laterality: Left;  . TYMPANOMASTOIDECTOMY Right 07/07/2016   Procedure: RIGHT TYMPANOMASTOIDECTOMY;  Surgeon: Newman Pies, MD;  Location: [facility] OR;  Service: ENT;  Laterality: Right;    Her Family History Is Significant For: Family History  Problem Relation Age of Onset  . Diabetes Mother   . Heart disease Mother   . Cancer Mother        ovarian  . Diabetes Father   . COPD Father   .  Heart disease Father     Her Social History Is Significant For: Social History   Socioeconomic History  . Marital status: Married    Spouse name: None  . Number of children: None  . Years of education: None  . Highest education level: None  Social Needs  . Financial resource strain: None  . Food insecurity - worry: None  . Food insecurity - inability: None  . Transportation needs - medical: None  . Transportation needs - non-medical: None  Occupational History  . None  Tobacco Use  . Smoking status: Current Some Day Smoker    Packs/day: 0.25    Years: 2.00    Pack  years: 0.50    Types: Cigarettes  . Smokeless tobacco: Never Used  . Tobacco comment: 3-4 cigs a day  Substance and Sexual Activity  . Alcohol use: No  . Drug use: No  . Sexual activity: None    Comment: only smokes about 2 cigrettes a day/trying to quit  Other Topics Concern  . None  Social History Narrative  . None    Her Allergies Are:  Allergies  Allergen Reactions  . Amoxicillin Diarrhea and Nausea And Vomiting    Has patient had a PCN reaction causing immediate rash, facial/tongue/throat swelling, SOB or lightheadedness with hypotension: No Has patient had a PCN reaction causing severe rash involving mucus membranes or skin necrosis: No Has patient had a PCN reaction that required hospitalization: No Has patient had a PCN reaction occurring within the last 10 years: No If all of the above answers are "NO", then may proceed with Cephalosporin use.   :   Her Current Medications Are:  Outpatient Encounter Medications as of 01/05/2018  Medication Sig  . colestipol (COLESTID) 1 g tablet TAKE 2 TABLETS BY MOUTH TWICE DAILY  . FLUoxetine (PROZAC) 20 MG tablet Take 1 tab in the morning for 2 weeks, then take two tabs daily  . hydrOXYzine (ATARAX/VISTARIL) 25 MG tablet Take 1 tablet (25 mg total) by mouth 3 (three) times daily as needed.  . labetalol (NORMODYNE) 100 MG tablet TAKE 1 TABLET BY MOUTH TWICE DAILY  . levothyroxine (SYNTHROID, LEVOTHROID) 75 MCG tablet Take 1 tablet (75 mcg total) by mouth daily.  . nicotine (NICODERM CQ - DOSED IN MG/24 HOURS) 14 mg/24hr patch Place 1 patch (14 mg total) onto the skin daily.  . pantoprazole (PROTONIX) 40 MG tablet Take 1 tablet (40 mg total) by mouth daily.   No facility-administered encounter medications on file as of 01/05/2018.   :  Review of Systems:  Out of a complete 14 point review of systems, all are reviewed and negative with the exception of these symptoms as listed below: Review of Systems  Neurological:       Pt  presents today to discuss her sleep. Pt has never had a sleep study but does endorse snoring.  Epworth Sleepiness Scale 0= would never doze 1= slight chance of dozing 2= moderate chance of dozing 3= high chance of dozing  Sitting and reading: 2 Watching TV: 1 Sitting inactive in a public place (ex. Theater or meeting): 0 As a passenger in a car for an hour without a break: 2 Lying down to rest in the afternoon: 3 Sitting and talking to someone: 1 Sitting quietly after lunch (no alcohol): 2 In a car, while stopped in traffic: 0 Total: 11     Objective:  Neurological Exam  Physical Exam Physical Examination:   Vitals:   01/05/18 1107  BP: 124/76  Pulse: 64    General Examination: The patient is a very pleasant 37 y.o. female in no acute distress. She appears well-developed and well-nourished and well groomed.   HEENT: Normocephalic, atraumatic, pupils are equal, round and reactive to light and accommodation. Extraocular tracking is good without limitation to gaze excursion or nystagmus noted. Normal smooth pursuit is noted. Hearing is grossly intact. Tympanic membranes are clear bilaterally. Face is symmetric with normal facial animation and normal facial sensation. Speech is clear with no dysarthria noted. There is no hypophonia. There is no lip, neck/head, jaw or voice tremor. Neck is supple with full range of passive and active motion. There are no carotid bruits on auscultation. Oropharynx exam reveals: mild mouth dryness, adequate dental hygiene with some teeth missing, and moderate airway crowding, due to smaller airway entry, tonsils are 1+ bilaterally, larger uvula. Mallampati is class II. Tongue protrudes centrally and palate elevates symmetrically. Neck size is 16 inches. She has a nearly absent overbite.  Chest: Clear to auscultation without wheezing, rhonchi or crackles noted.  Heart: S1+S2+0, regular and normal without murmurs, rubs or gallops noted.   Abdomen:  Soft, non-tender and non-distended with normal bowel sounds appreciated on auscultation.  Extremities: There is no pitting edema in the distal lower extremities bilaterally. Pedal pulses are intact.  Skin: Warm and dry without trophic changes noted.  Musculoskeletal: exam reveals no obvious joint deformities, tenderness or joint swelling or erythema.   Neurologically:  Mental status: The patient is awake, alert and oriented in all 4 spheres. Her immediate and remote memory, attention, language skills and fund of knowledge are appropriate. There is no evidence of aphasia, agnosia, apraxia or anomia. Speech is clear with normal prosody and enunciation. Thought process is linear. Mood is normal and affect is normal.  Cranial nerves II - XII are as described above under HEENT exam. In addition: shoulder shrug is normal with equal shoulder height noted. Motor exam: Normal bulk, strength and tone is noted. There is no drift, tremor or rebound. Romberg is negative. Reflexes are 1+ . Fine motor skills and coordination: intact with normal finger taps, normal hand movements, normal rapid alternating patting, normal foot taps and normal foot agility.  Cerebellar testing: No dysmetria or intention tremor on finger to nose testing. Heel to shin is limited by body habitus. There is no truncal or gait ataxia.  Sensory exam: intact to light touch in the upper and lower extremities.  Gait, station and balance: She stands easily. No veering to one side is noted. No leaning to one side is noted. Posture is age-appropriate and stance is narrow based. Gait shows normal stride length and normal pace. No problems turning are noted. Tandem walk is somewhat challenging for her.               Assessment and Plan:  In summary, Mouna L Meaney is a very pleasant 37 y.o.-year old female with an underlying medical history of irritable bowel syndrome, hypertension, hypothyroidism, depression, anxiety, reflux disease, anemia,  smoking and morbid obesity with a BMI of over 50, whose history and physical exam are concerning for obstructive sleep apnea (OSA). I had a long chat with the patient about my findings and the diagnosis of OSA, its prognosis and treatment options. We talked about medical treatments, surgical interventions and non-pharmacological approaches. I explained in particular the risks and ramifications of untreated moderate to severe OSA, especially with respect to developing cardiovascular disease down the Road, including congestive heart failure,  difficult to treat hypertension, cardiac arrhythmias, or stroke. Even type 2 diabetes has, in part, been linked to untreated OSA. Symptoms of untreated OSA include daytime sleepiness, memory problems, mood irritability and mood disorder such as depression and anxiety, lack of energy, as well as recurrent headaches, especially morning headaches. We talked about smoking cessation and trying to maintain a healthy lifestyle in general, as well as the importance of weight control. I encouraged the patient to eat healthy, exercise daily and keep well hydrated, to keep a scheduled bedtime and wake time routine, to not skip any meals and eat healthy snacks in between meals. I advised the patient not to drive when feeling sleepy.  I recommended the following at this time: sleep study with potential positive airway pressure titration. (We will score hypopneas at 4%).   I explained the sleep test procedure to the patient and also outlined possible surgical and non-surgical treatment options of OSA, including the use of a custom-made dental device (which would require a referral to a specialist dentist or oral surgeon), upper airway surgical options, such as pillar implants, radiofrequency surgery, tongue base surgery, and UPPP (which would involve a referral to an ENT surgeon). Rarely, jaw surgery such as mandibular advancement may be considered.  I also explained the CPAP treatment  option to the patient, who indicated that she would be willing to try CPAP if the need arises. I explained the importance of being compliant with PAP treatment, not only for insurance purposes but primarily to improve Her symptoms, and for the patient's long term health benefit, including to reduce Her cardiovascular risks. I answered all her questions today and the patient was in agreement. I would like to see her back after the sleep study is completed and encouraged her to call with any interim questions, concerns, problems or updates.   Thank you very much for allowing me to participate in the care of this nice patient. If I can be of any further assistance to you please do not hesitate to call me at [phone].  Sincerely,   Huston Foley, MD, PhD

## 2018-01-05 NOTE — Patient Instructions (Addendum)

## 2018-01-12 ENCOUNTER — Encounter: Payer: Self-pay | Admitting: Pediatrics

## 2018-01-12 ENCOUNTER — Ambulatory Visit: Payer: Medicaid Other | Admitting: Pediatrics

## 2018-01-12 VITALS — BP 104/74 | HR 70 | Temp 97.8°F | Ht 66.0 in | Wt 315.2 lb

## 2018-01-12 DIAGNOSIS — I10 Essential (primary) hypertension: Secondary | ICD-10-CM | POA: Diagnosis not present

## 2018-01-12 DIAGNOSIS — Z6841 Body Mass Index (BMI) 40.0 and over, adult: Secondary | ICD-10-CM | POA: Diagnosis not present

## 2018-01-12 DIAGNOSIS — F410 Panic disorder [episodic paroxysmal anxiety] without agoraphobia: Secondary | ICD-10-CM | POA: Diagnosis not present

## 2018-01-12 DIAGNOSIS — R0683 Snoring: Secondary | ICD-10-CM | POA: Diagnosis not present

## 2018-01-12 NOTE — Progress Notes (Signed)
  Subjective:   Patient ID: Amber Colon, female    DOB: 04/28/1981, 37 y.o.   MRN: [id number] CC: Follow-up (Depression)  HPI: Amber Colon is a 37 y.o. female presenting for Follow-up (Depression)  Past few days has felt "blah", not interested in doing much after 2pm Says she feels ok in the morning  Panic attacks: improved on prozac Increasing to two tabs in 2 days  Snoring: has upcoming sleep study in Feb for further eval  HTN: no CP, no SOB  Elevated BMI: disappointed in 5 lb weight gain over past few weeks Drinking 2-3 reg sodas a day Cooking mostly at home Granola bar or banana for breakfast Banana sandwich and protein bar for lunch pork chops and potatoes for dinner last night  Wants to quit smoking Insurance paid for 7mg  nicotine patches but not 14mg   Relevant past medical, surgical, family and social history reviewed. Allergies and medications reviewed and updated. Social History   Tobacco Use  Smoking Status Current Some Day Smoker  . Packs/day: 0.25  . Years: 2.00  . Pack years: 0.50  . Types: Cigarettes  Smokeless Tobacco Never Used  Tobacco Comment   3-4 cigs a day   ROS: Per HPI   Objective:    BP 104/74   Pulse 70   Temp 97.8 F (36.6 C) (Oral)   Ht 5\' 6"  (1.676 m)   Wt (!) 315 lb 3.2 oz (143 kg)   BMI 50.87 kg/m   Wt Readings from Last 3 Encounters:  01/12/18 (!) 315 lb 3.2 oz (143 kg)  01/05/18 (!) 314 lb (142.4 kg)  12/29/17 (!) 310 lb 3.2 oz (140.7 kg)    Gen: NAD, alert, cooperative with exam, NCAT EYES: EOMI, no conjunctival injection, or no icterus ENT:  TMs pearly gray b/l, OP without erythema LYMPH: no cervical LAD CV: NRRR, normal S1/S2, no murmur, distal pulses 2+ b/l Resp: CTABL, no wheezes, normal WOB Abd: +BS, soft, NTND. no guarding or organomegaly Ext: No edema, warm Neuro: Alert and oriented MSK: normal muscle bulk Psych: normal affect  Assessment & Plan:  Amber Colon was seen today for follow-up multiple med  problems  Diagnoses and all orders for this visit:  Panic attacks Improving on prozac, continue. Will increase dose in two days  Snoring Suspect sleep apnea contributing to decreased energy in the afternoon, also possibly mood problem. Has upcoming sleep study  Morbid obesity with BMI of 50.0-59.9, adult (HCC) Cont lifestyle changes, goal 5 lb weight loss before next visit  Essential hypertension Well controlled, cont current medications  Follow up plan: Return in about 3 months (around 04/12/2018). Rex Krasarol Jo Cerone, MD [facility]

## 2018-01-18 ENCOUNTER — Other Ambulatory Visit: Payer: Self-pay | Admitting: Pediatrics

## 2018-01-18 DIAGNOSIS — I10 Essential (primary) hypertension: Secondary | ICD-10-CM

## 2018-01-27 ENCOUNTER — Ambulatory Visit: Payer: Medicaid Other | Admitting: Pediatrics

## 2018-02-03 ENCOUNTER — Other Ambulatory Visit: Payer: Self-pay | Admitting: Pediatrics

## 2018-02-03 DIAGNOSIS — Z9049 Acquired absence of other specified parts of digestive tract: Principal | ICD-10-CM

## 2018-02-03 DIAGNOSIS — R197 Diarrhea, unspecified: Secondary | ICD-10-CM

## 2018-02-03 DIAGNOSIS — K9189 Other postprocedural complications and disorders of digestive system: Secondary | ICD-10-CM

## 2018-02-07 ENCOUNTER — Other Ambulatory Visit: Payer: Self-pay

## 2018-02-07 ENCOUNTER — Encounter: Payer: Self-pay | Admitting: Gastroenterology

## 2018-02-07 ENCOUNTER — Ambulatory Visit: Payer: Medicaid Other | Admitting: Gastroenterology

## 2018-02-07 VITALS — BP 110/70 | HR 76 | Ht 65.35 in | Wt 314.5 lb

## 2018-02-07 DIAGNOSIS — K219 Gastro-esophageal reflux disease without esophagitis: Secondary | ICD-10-CM

## 2018-02-07 DIAGNOSIS — R1013 Epigastric pain: Secondary | ICD-10-CM

## 2018-02-07 DIAGNOSIS — R1310 Dysphagia, unspecified: Secondary | ICD-10-CM

## 2018-02-07 NOTE — H&P (View-Only) (Signed)
HPI: [name] who was referred to me by Johna SheriffVincent, Carol L, MD  to evaluate chronic GERD, chronic dyspepsia, dysphasia.    Chief complaint is GERD, dyspepsia, dysphasia  She says that she has had pyrosis and indigestion symptoms for many many years.  At least 4 years.  She describes her indigestion as burning in her chest, burning in her back, stabbing pains in her upper back, acid taste in her mouth.  She has tried proton pump inhibitors in the past and these help with the burning but not with the other stabbing pains or associated belching.  Has been on protonix for 1-2 weeks. Was on something different for abouyt 3 years.   Can help with pyrosis but not her dyspepsia, belching.  Her weight fluctuates.  For about 2 years she has had solid food dysphasia.  She points to her upper neck as the site of trouble.  She will usually have to drink a lot of fluids to get the food bolus to pass.  She does not have to throw up for it ever.  No overt GI bleeding  Gallbladder surgery for symptomatic gasttones  Old Data Reviewed:     Review of systems: Pertinent positive and negative review of systems were noted in the above HPI section. All other review negative.   Past Medical History:  Diagnosis Date  . Anemia    not taking her iron pills either  . Anxiety   . Depression   . Gallstones   . GERD (gastroesophageal reflux disease)   . Headache    "occasional"  . History of blood transfusion    blood after miscarrige  . Hypertension    doesn't take her bp pills  . Hypothyroidism    again, takes no med  . Obesity   . Panic attack     Past Surgical History:  Procedure Laterality Date  . CHOLECYSTECTOMY N/A 09/08/2016   Procedure: LAPAROSCOPIC CHOLECYSTECTOMY WITH INTRAOPERATIVE CHOLANGIOGRAM;  Surgeon: Chevis PrettyPaul Toth III, MD;  Location: [facility];  Service: General;  Laterality: N/A;  . DILATION AND CURETTAGE OF UTERUS    . TUBAL LIGATION    .  TYMPANOMASTOIDECTOMY Left 12/24/2015   Procedure: LEFT MODIFIED  RADICAL TYMPANOMASTOIDECTOMY;  Surgeon: Newman PiesSu Teoh, MD;  Location: [facility] OR;  Service: ENT;  Laterality: Left;  . TYMPANOMASTOIDECTOMY Right 07/07/2016   Procedure: RIGHT TYMPANOMASTOIDECTOMY;  Surgeon: Newman PiesSu Teoh, MD;  Location: [facility];  Service: ENT;  Laterality: Right;    Current Outpatient Medications  Medication Sig Dispense Refill  . colestipol (COLESTID) 1 g tablet TAKE 2 TABLETS BY MOUTH TWICE DAILY 120 tablet 2  . FLUoxetine (PROZAC) 20 MG tablet Take 1 tab in the morning for 2 weeks, then take two tabs daily (Patient taking differently: Take 40 mg by mouth daily. ) 60 tablet 3  . labetalol (NORMODYNE) 100 MG tablet TAKE 1 TABLET BY MOUTH TWICE DAILY 180 tablet 0  . levothyroxine (SYNTHROID, LEVOTHROID) 75 MCG tablet Take 1 tablet (75 mcg total) by mouth daily. 90 tablet 1  . pantoprazole (PROTONIX) 40 MG tablet Take 1 tablet (40 mg total) by mouth daily. 30 tablet 3   No current facility-administered medications for this visit.     Allergies as of 02/07/2018 - Review Complete 02/07/2018  Allergen Reaction Noted  . Amoxicillin Diarrhea and Nausea And Vomiting 07/22/2015    Family History  Problem Relation Age of Onset  . Diabetes Mother   . Heart disease Mother   .  Ovarian cancer Mother   . Stroke Mother   . Hypertension Mother   . Diabetes Father   . COPD Father   . Heart disease Father   . Anuerysm Father        Neck, stomach  . Breast cancer Maternal Grandmother   . Diabetes Maternal Grandfather   . Diabetes Paternal Aunt   . Diabetes Paternal Uncle     Social History   Socioeconomic History  . Marital status: Married    Spouse name: Not on file  . Number of children: 3  . Years of education: Not on file  . Highest education level: Not on file  Social Needs  . Financial resource strain: Not on file  . Food insecurity - worry: Not on file  . Food insecurity - inability: Not on file  . Transportation  needs - medical: Not on file  . Transportation needs - non-medical: Not on file  Occupational History  . Occupation: stay at home mom  Tobacco Use  . Smoking status: Current Some Day Smoker    Packs/day: 0.25    Years: 2.00    Pack years: 0.50    Types: Cigarettes  . Smokeless tobacco: Never Used  . Tobacco comment: 3-4 cigs a day  Substance and Sexual Activity  . Alcohol use: No  . Drug use: No  . Sexual activity: Not on file    Comment: only smokes about 2 cigrettes a day/trying to quit  Other Topics Concern  . Not on file  Social History Narrative  . Not on file     Physical Exam: BP 110/70 (BP Location: Left Arm, Patient Position: Sitting, Cuff Size: Large)   Pulse 76   Ht 5' 5.35" (1.66 m) Comment: height measured without shoes  Wt (!) 314 lb 8 oz (142.7 kg)   BMI 51.77 kg/m  Constitutional: generally well-appearing Psychiatric: alert and oriented x3 Eyes: extraocular movements intact Mouth: oral pharynx moist, no lesions Neck: supple no lymphadenopathy Cardiovascular: heart regular rate and rhythm Lungs: clear to auscultation bilaterally Abdomen: soft, nontender, nondistended, no obvious ascites, no peritoneal signs, normal bowel sounds Extremities: no lower extremity edema bilaterally Skin: no lesions on visible extremities   Assessment and plan: 37 y.o. female with morbid obesity, GERD, dyspepsia, dysphasia  I suspect her symptoms are related to acid reflux.  Her obesity probably contributes.  I recommended she continue taking her proton pump inhibitor in the morning shortly before breakfast and also start taking an H2 blocker at bedtime nightly.  I think upper endoscopy is reasonable given her dysphasia but I am overall not too suspicious for underlying neoplasm.  This test will have to be at [facility] given her BMI greater than 50.    Please see the "Patient Instructions" section for addition details about the plan.   Rob Bunting, MD [place] 02/07/2018, 2:49 PM  Cc: Johna Sheriff, MD

## 2018-02-07 NOTE — Progress Notes (Signed)
HPI: [name] who was referred to me by Amber Colon, Carol L, MD  to evaluate chronic GERD, chronic dyspepsia, dysphasia.    Chief complaint is GERD, dyspepsia, dysphasia  She says that she has had pyrosis and indigestion symptoms for many many years.  At least 4 years.  She describes her indigestion as burning in her chest, burning in her back, stabbing pains in her upper back, acid taste in her mouth.  She has tried proton pump inhibitors in the past and these help with the burning but not with the other stabbing pains or associated belching.  Has been on protonix for 1-2 weeks. Was on something different for abouyt 3 years.   Can help with pyrosis but not her dyspepsia, belching.  Her weight fluctuates.  For about 2 years she has had solid food dysphasia.  She points to her upper neck as the site of trouble.  She will usually have to drink a lot of fluids to get the food bolus to pass.  She does not have to throw up for it ever.  No overt GI bleeding  Gallbladder surgery for symptomatic gasttones  Old Data Reviewed:     Review of systems: Pertinent positive and negative review of systems were noted in the above HPI section. All other review negative.   Past Medical History:  Diagnosis Date  . Anemia    not taking her iron pills either  . Anxiety   . Depression   . Gallstones   . GERD (gastroesophageal reflux disease)   . Headache    "occasional"  . History of blood transfusion    blood after miscarrige  . Hypertension    doesn't take her bp pills  . Hypothyroidism    again, takes no med  . Obesity   . Panic attack     Past Surgical History:  Procedure Laterality Date  . CHOLECYSTECTOMY N/A 09/08/2016   Procedure: LAPAROSCOPIC CHOLECYSTECTOMY WITH INTRAOPERATIVE CHOLANGIOGRAM;  Surgeon: Chevis PrettyPaul Toth III, MD;  Location: [facility] OR;  Service: General;  Laterality: N/A;  . DILATION AND CURETTAGE OF UTERUS    . TUBAL LIGATION    .  TYMPANOMASTOIDECTOMY Left 12/24/2015   Procedure: LEFT MODIFIED  RADICAL TYMPANOMASTOIDECTOMY;  Surgeon: Newman PiesSu Teoh, MD;  Location: [facility] OR;  Service: ENT;  Laterality: Left;  . TYMPANOMASTOIDECTOMY Right 07/07/2016   Procedure: RIGHT TYMPANOMASTOIDECTOMY;  Surgeon: Newman PiesSu Teoh, MD;  Location: [facility] OR;  Service: ENT;  Laterality: Right;    Current Outpatient Medications  Medication Sig Dispense Refill  . colestipol (COLESTID) 1 g tablet TAKE 2 TABLETS BY MOUTH TWICE DAILY 120 tablet 2  . FLUoxetine (PROZAC) 20 MG tablet Take 1 tab in the morning for 2 weeks, then take two tabs daily (Patient taking differently: Take 40 mg by mouth daily. ) 60 tablet 3  . labetalol (NORMODYNE) 100 MG tablet TAKE 1 TABLET BY MOUTH TWICE DAILY 180 tablet 0  . levothyroxine (SYNTHROID, LEVOTHROID) 75 MCG tablet Take 1 tablet (75 mcg total) by mouth daily. 90 tablet 1  . pantoprazole (PROTONIX) 40 MG tablet Take 1 tablet (40 mg total) by mouth daily. 30 tablet 3   No current facility-administered medications for this visit.     Allergies as of 02/07/2018 - Review Complete 02/07/2018  Allergen Reaction Noted  . Amoxicillin Diarrhea and Nausea And Vomiting 07/22/2015    Family History  Problem Relation Age of Onset  . Diabetes Mother   . Heart disease Mother   .  Ovarian cancer Mother   . Stroke Mother   . Hypertension Mother   . Diabetes Father   . COPD Father   . Heart disease Father   . Anuerysm Father        Neck, stomach  . Breast cancer Maternal Grandmother   . Diabetes Maternal Grandfather   . Diabetes Paternal Aunt   . Diabetes Paternal Uncle     Social History   Socioeconomic History  . Marital status: Married    Spouse name: Not on file  . Number of children: 3  . Years of education: Not on file  . Highest education level: Not on file  Social Needs  . Financial resource strain: Not on file  . Food insecurity - worry: Not on file  . Food insecurity - inability: Not on file  . Transportation  needs - medical: Not on file  . Transportation needs - non-medical: Not on file  Occupational History  . Occupation: stay at home mom  Tobacco Use  . Smoking status: Current Some Day Smoker    Packs/day: 0.25    Years: 2.00    Pack years: 0.50    Types: Cigarettes  . Smokeless tobacco: Never Used  . Tobacco comment: 3-4 cigs a day  Substance and Sexual Activity  . Alcohol use: No  . Drug use: No  . Sexual activity: Not on file    Comment: only smokes about 2 cigrettes a day/trying to quit  Other Topics Concern  . Not on file  Social History Narrative  . Not on file     Physical Exam: BP 110/70 (BP Location: Left Arm, Patient Position: Sitting, Cuff Size: Large)   Pulse 76   Ht 5' 5.35" (1.66 m) Comment: height measured without shoes  Wt (!) 314 lb 8 oz (142.7 kg)   BMI 51.77 kg/m  Constitutional: generally well-appearing Psychiatric: alert and oriented x3 Eyes: extraocular movements intact Mouth: oral pharynx moist, no lesions Neck: supple no lymphadenopathy Cardiovascular: heart regular rate and rhythm Lungs: clear to auscultation bilaterally Abdomen: soft, nontender, nondistended, no obvious ascites, no peritoneal signs, normal bowel sounds Extremities: no lower extremity edema bilaterally Skin: no lesions on visible extremities   Assessment and plan: 37 y.o. female with morbid obesity, GERD, dyspepsia, dysphasia  I suspect her symptoms are related to acid reflux.  Her obesity probably contributes.  I recommended she continue taking her proton pump inhibitor in the morning shortly before breakfast and also start taking an H2 blocker at bedtime nightly.  I think upper endoscopy is reasonable given her dysphasia but I am overall not too suspicious for underlying neoplasm.  This test will have to be at [facility] given her BMI greater than 50.    Please see the "Patient Instructions" section for addition details about the plan.   Amber Bunting, MD [facility] 02/07/2018, 2:49 PM  Cc: Amber Sheriff, MD

## 2018-02-07 NOTE — Patient Instructions (Addendum)
Continue protonix 40mg  one pill before breakfast daily.  STart ranitidine 150mg  pills, one pill at bedtime nightly.  You will be set up for an upper endoscopy for GERD, dysphagia.  Losing weight has been shown to be very effective at decreasing GERD, acid related problems.  Normal BMI (Body Mass Index- based on height and weight) is between 19 and 25. Your BMI today is Body mass index is 51.77 kg/m. Marland Kitchen. Please consider follow up  regarding your BMI with your Primary Care Provider.

## 2018-02-09 ENCOUNTER — Ambulatory Visit (INDEPENDENT_AMBULATORY_CARE_PROVIDER_SITE_OTHER): Payer: Medicaid Other | Admitting: Neurology

## 2018-02-09 DIAGNOSIS — R0683 Snoring: Secondary | ICD-10-CM

## 2018-02-09 DIAGNOSIS — G4720 Circadian rhythm sleep disorder, unspecified type: Secondary | ICD-10-CM

## 2018-02-09 DIAGNOSIS — Z6841 Body Mass Index (BMI) 40.0 and over, adult: Secondary | ICD-10-CM

## 2018-02-09 DIAGNOSIS — R519 Headache, unspecified: Secondary | ICD-10-CM

## 2018-02-09 DIAGNOSIS — G4719 Other hypersomnia: Secondary | ICD-10-CM

## 2018-02-09 DIAGNOSIS — G4710 Hypersomnia, unspecified: Secondary | ICD-10-CM

## 2018-02-09 DIAGNOSIS — R51 Headache: Secondary | ICD-10-CM

## 2018-02-09 DIAGNOSIS — Z820 Family history of epilepsy and other diseases of the nervous system: Secondary | ICD-10-CM

## 2018-02-13 ENCOUNTER — Telehealth: Payer: Self-pay

## 2018-02-13 NOTE — Telephone Encounter (Signed)
I called pt. I advised pt that Dr. Frances FurbishAthar reviewed pt's sleep study and found that pt did not have any significant osa, except for mild to moderate snoring, for which pt can discuss with a qualified dentist the need for an oral appliance, if desired. Dr. Frances FurbishAthar  recommends that pt avoid sleeping on her back and pursue weight loss for the mild REM and supine osa. I reviewed sleep hygiene recommendations with the pt, including trying to keep a regular sleep wake schedule, avoiding electronics in the bedroom, keeping the bedroom cool, dark, and quiet, and avoiding eating or exercising within 2 hours of bedtime as well as eating in the middle of the night. I advised pt to keep pets out of the bedroom. I discussed with pt the importance of stress relief and to try meditation, deep breathing exercises, and/or a white noise machine or fan to diffuse other noise distracters. I advised pt to not drink alcohol before bedtime and to never mix alcohol and sedating medications. Pt was advised to avoid narcotic pain medication close to bedtime. I advised pt that a copy of these sleep study results will be sent to Dr. Purvis SheffieldKoneswaran. Pt verbalized understanding of results. Pt had no questions at this time but was encouraged to call back if questions arise.

## 2018-02-13 NOTE — Telephone Encounter (Signed)
I called pt to discuss her sleep study results. No answer, left a message asking her to call me back. 

## 2018-02-13 NOTE — Telephone Encounter (Signed)
-----   Message from Huston FoleySaima Athar, MD sent at 02/13/2018  9:16 AM EST ----- Patient referred by Dr. Purvis SheffieldKoneswaran, seen by me on 01/05/18, diagnostic PSG on 02/09/18.   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 Weight loss and avoidance of the supine sleep position may suffice as treatment. For disturbing snoring, an oral appliance (through a qualified dentist) can be considered.  She can FU with the referring provider and PCP.  Please remind patient to try to maintain good sleep hygiene, which means: Keep a regular sleep and wake schedule and make enough time for sleep (7 1/2 to 8 1/2 hours for the average adult), try not to exercise or have a meal within 2 hours of your bedtime, try to keep your bedroom conducive for sleep, that is, cool and dark, without light distractors such as an illuminated alarm clock, and refrain from watching TV right before sleep or in the middle of the night and do not keep the TV or radio on during the night. If a nightlight is used, have it away from the visual field. Also, try not to use or play on electronic devices at bedtime, such as your cell phone, tablet PC or laptop. If you like to read at bedtime on an electronic device, try to dim the background light as much as possible. Do not eat in the middle of the night. Keep pets away from the bedroom environment. For stress relief, try meditation, deep breathing exercises (there are many books and CDs available), a white noise machine or fan can help to diffuse other noise distractors, such as traffic noise. Do not drink alcohol before bedtime, as it can disturb sleep and cause middle of the night awakenings. Never mix alcohol and sedating medications! Avoid narcotic pain medication close to bedtime, as opioids/narcotics can suppress breathing  drive and breathing effort.   Thanks,  Huston FoleySaima Athar, MD, PhD [facility])

## 2018-02-13 NOTE — Progress Notes (Signed)
Patient referred by Dr. Purvis SheffieldKoneswaran, seen by me on 01/05/18, diagnostic PSG on 02/09/18.   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 Weight loss and avoidance of the supine sleep position may suffice as treatment. For disturbing snoring, an oral appliance (through a qualified dentist) can be considered.  She can FU with the referring provider and PCP.  Please remind patient to try to maintain good sleep hygiene, which means: Keep a regular sleep and wake schedule and make enough time for sleep (7 1/2 to 8 1/2 hours for the average adult), try not to exercise or have a meal within 2 hours of your bedtime, try to keep your bedroom conducive for sleep, that is, cool and dark, without light distractors such as an illuminated alarm clock, and refrain from watching TV right before sleep or in the middle of the night and do not keep the TV or radio on during the night. If a nightlight is used, have it away from the visual field. Also, try not to use or play on electronic devices at bedtime, such as your cell phone, tablet PC or laptop. If you like to read at bedtime on an electronic device, try to dim the background light as much as possible. Do not eat in the middle of the night. Keep pets away from the bedroom environment. For stress relief, try meditation, deep breathing exercises (there are many books and CDs available), a white noise machine or fan can help to diffuse other noise distractors, such as traffic noise. Do not drink alcohol before bedtime, as it can disturb sleep and cause middle of the night awakenings. Never mix alcohol and sedating medications! Avoid narcotic pain medication close to bedtime, as opioids/narcotics can suppress breathing drive and breathing effort.   Thanks,  Huston FoleySaima Psalm Schappell, MD, PhD [facility])

## 2018-02-13 NOTE — Procedures (Signed)
PATIENT'S NAME:  Amber Colon, Amber Colon DOB:      1981/03/16      MR#:    [id number]     DATE OF RECORDING: 02/09/2018 REFERRING M.D.: Dr. Prentice Docker,      PCP: Norwood Levo. Oswaldo Done, MD Study Performed:   Baseline Polysomnogram HISTORY: 37 year old woman with a history of irritable bowel syndrome, hypertension, hypothyroidism, depression, anxiety, reflux disease, anemia, smoking and morbid obesity with a BMI of over 50, who reports snoring, and witnessed apneas while asleep, as well as daytime somnolence. The patient endorsed the Epworth Sleepiness Scale at 11 points. The patient's weight 314 pounds with a height of 66 (inches), resulting in a BMI of 50.3 kg/m2. The patient's neck circumference measured 16 inches.  CURRENT MEDICATIONS: Colestid, Prozac, Vistaril, Normodyne, Synthroid, Nicoderm CQ, Protonix   PROCEDURE:  This is a multichannel digital polysomnogram utilizing the Somnostar 11.2 system.  Electrodes and sensors were applied and monitored per AASM Specifications.   EEG, EOG, Chin and Limb EMG, were sampled at 200 Hz.  ECG, Snore and Nasal Pressure, Thermal Airflow, Respiratory Effort, CPAP Flow and Pressure, Oximetry was sampled at 50 Hz. Digital video and audio were recorded.      BASELINE STUDY  Lights Out was at 22:06 and Lights On at 04:59.  Total recording time (TRT) was 414 minutes, with a total sleep time (TST) of  284.5 minutes.   The patient's sleep latency was 104.5 minutes, which is markedly delayed.  REM latency was 113.5 minutes.  The sleep efficiency was 68.7 %.     SLEEP ARCHITECTURE: WASO (Wake after sleep onset) was 51 minutes with mild sleep fragmentation noted.  There were 20.5 minutes in Stage N1, 188.5 minutes Stage N2, 31 minutes Stage N3 and 44.5 minutes in Stage REM.  The percentage of Stage N1 was 7.2%, Stage N2 was 66.3%, which is mildly increased, Stage N3 was 10.9% and Stage R (REM sleep) was 15.6%, which is reduced. The arousals were noted as: 50 were spontaneous, 0  were associated with PLMs, 2 were associated with respiratory events.  Audio and video analysis did not show any abnormal or unusual movements, behaviors, phonations or vocalizations. The patient took no bathroom breaks. Mild to moderate snoring was noted. The EKG was in keeping with normal sinus rhythm (NSR).  RESPIRATORY ANALYSIS:  There were a total of 8 respiratory events:  1 obstructive apneas, 0 central apneas and 0 mixed apneas with a total of 1 apneas and an apnea index (AI) of .2 /hour. There were 7 hypopneas with a hypopnea index of 1.5 /hour. The patient also had 0 respiratory event related arousals (RERAs).      The total APNEA/HYPOPNEA INDEX (AHI) was 1.7/hour and the total RESPIRATORY DISTURBANCE INDEX was 1.7 /hour.  8 events occurred in REM sleep and 0 events in NREM. The REM AHI was 10.8 /hour, versus a non-REM AHI of 0. The patient spent 35 minutes of total sleep time in the supine position and 250 minutes in non-supine.. The supine AHI was 10.3 versus a non-supine AHI of 0.5.  OXYGEN SATURATION & C02:  The Wake baseline 02 saturation was 98%, with the lowest being 89%. Time spent below 89% saturation equaled 0 minutes.  PERIODIC LIMB MOVEMENTS: The patient had a total of 0 Periodic Limb Movements.  The Periodic Limb Movement (PLM) index was 0 and the PLM Arousal index was 0/hour.  Post-study, the patient indicated that sleep was worse than usual.   IMPRESSION:  1. Primary Snoring  2. Dysfunctions associated with sleep stages or arousal from sleep  RECOMMENDATIONS:  1. 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 Weight loss and avoidance of the supine sleep position may suffice as treatment. For disturbing snoring, an oral appliance (through a qualified dentist) can be considered. This study does not  support an intrinsic sleep disorder as a cause of the patient's symptoms. Other causes, including circadian rhythm disturbances, an underlying mood disorder, medication effect and/or an underlying medical problem cannot be ruled out. 2. This study shows sleep fragmentation and abnormal sleep stage percentages; these are nonspecific findings and per se do not signify an intrinsic sleep disorder or a cause for the patient's sleep-related symptoms. Causes include (but are not limited to) the first night effect of the sleep study, circadian rhythm disturbances, medication effect or an underlying mood disorder or medical problem.  3. The patient should be cautioned not to drive, work at heights, or operate dangerous or heavy equipment when tired or sleepy. Review and reiteration of good sleep hygiene measures should be pursued with any patient. 4. The patient can follow-up with her referring provider, who will be notified of the test results.  I certify that I have reviewed the entire raw data recording prior to the issuance of this report in accordance with the Standards of Accreditation of the American Academy of Sleep Medicine (AASM)   Huston FoleySaima Armando Lauman, MD, PhD Diplomat, American Board of Psychiatry and Neurology (Neurology and Sleep Medicine)

## 2018-02-14 ENCOUNTER — Encounter: Payer: Self-pay | Admitting: Gastroenterology

## 2018-02-21 ENCOUNTER — Encounter (HOSPITAL_COMMUNITY): Payer: Self-pay | Admitting: Emergency Medicine

## 2018-02-21 ENCOUNTER — Other Ambulatory Visit: Payer: Self-pay

## 2018-02-23 ENCOUNTER — Ambulatory Visit (HOSPITAL_COMMUNITY): Payer: Medicaid Other | Admitting: Anesthesiology

## 2018-02-23 ENCOUNTER — Other Ambulatory Visit: Payer: Self-pay

## 2018-02-23 ENCOUNTER — Encounter (HOSPITAL_COMMUNITY)
Admission: RE | Disposition: A | Discharge status: Home / Self Care | Payer: Self-pay | Source: Ambulatory Visit | Attending: Gastroenterology

## 2018-02-23 ENCOUNTER — Ambulatory Visit (HOSPITAL_COMMUNITY)
Admission: RE | Admit: 2018-02-23 | Discharge: 2018-02-23 | Disposition: A | Discharge status: Home / Self Care | Payer: Medicaid Other | Source: Ambulatory Visit | Attending: Gastroenterology | Admitting: Gastroenterology

## 2018-02-23 ENCOUNTER — Encounter (HOSPITAL_COMMUNITY): Payer: Self-pay | Admitting: *Deleted

## 2018-02-23 DIAGNOSIS — R1310 Dysphagia, unspecified: Secondary | ICD-10-CM | POA: Insufficient documentation

## 2018-02-23 DIAGNOSIS — K2950 Unspecified chronic gastritis without bleeding: Secondary | ICD-10-CM | POA: Insufficient documentation

## 2018-02-23 DIAGNOSIS — Z6841 Body Mass Index (BMI) 40.0 and over, adult: Secondary | ICD-10-CM | POA: Insufficient documentation

## 2018-02-23 DIAGNOSIS — Z9049 Acquired absence of other specified parts of digestive tract: Secondary | ICD-10-CM | POA: Diagnosis not present

## 2018-02-23 DIAGNOSIS — I10 Essential (primary) hypertension: Secondary | ICD-10-CM | POA: Diagnosis not present

## 2018-02-23 DIAGNOSIS — R1013 Epigastric pain: Secondary | ICD-10-CM | POA: Diagnosis present

## 2018-02-23 DIAGNOSIS — K2990 Gastroduodenitis, unspecified, without bleeding: Secondary | ICD-10-CM

## 2018-02-23 DIAGNOSIS — K2970 Gastritis, unspecified, without bleeding: Secondary | ICD-10-CM

## 2018-02-23 DIAGNOSIS — F410 Panic disorder [episodic paroxysmal anxiety] without agoraphobia: Secondary | ICD-10-CM | POA: Diagnosis not present

## 2018-02-23 DIAGNOSIS — Z79890 Hormone replacement therapy (postmenopausal): Secondary | ICD-10-CM | POA: Diagnosis not present

## 2018-02-23 DIAGNOSIS — E669 Obesity, unspecified: Secondary | ICD-10-CM | POA: Diagnosis not present

## 2018-02-23 DIAGNOSIS — K219 Gastro-esophageal reflux disease without esophagitis: Secondary | ICD-10-CM | POA: Insufficient documentation

## 2018-02-23 DIAGNOSIS — E039 Hypothyroidism, unspecified: Secondary | ICD-10-CM | POA: Diagnosis not present

## 2018-02-23 DIAGNOSIS — Z79899 Other long term (current) drug therapy: Secondary | ICD-10-CM | POA: Insufficient documentation

## 2018-02-23 DIAGNOSIS — F329 Major depressive disorder, single episode, unspecified: Secondary | ICD-10-CM | POA: Diagnosis not present

## 2018-02-23 DIAGNOSIS — B9681 Helicobacter pylori [H. pylori] as the cause of diseases classified elsewhere: Secondary | ICD-10-CM | POA: Diagnosis not present

## 2018-02-23 DIAGNOSIS — F17210 Nicotine dependence, cigarettes, uncomplicated: Secondary | ICD-10-CM | POA: Insufficient documentation

## 2018-02-23 HISTORY — PX: ESOPHAGOGASTRODUODENOSCOPY (EGD) WITH PROPOFOL: SHX5813

## 2018-02-23 LAB — PREGNANCY, URINE: PREG TEST UR: NEGATIVE

## 2018-02-23 SURGERY — ESOPHAGOGASTRODUODENOSCOPY (EGD) WITH PROPOFOL
Anesthesia: Monitor Anesthesia Care

## 2018-02-23 MED ORDER — PROPOFOL 10 MG/ML IV BOLUS
INTRAVENOUS | Status: AC
  Filled 2018-02-23: qty 40

## 2018-02-23 MED ORDER — SODIUM CHLORIDE 0.9 % IV SOLN: INTRAVENOUS | Status: DC

## 2018-02-23 MED ORDER — PROPOFOL 500 MG/50ML IV EMUL
INTRAVENOUS | Status: DC | PRN
  Administered 2018-02-23: 150 ug/kg/min via INTRAVENOUS

## 2018-02-23 MED ORDER — LACTATED RINGERS IV SOLN
INTRAVENOUS | Status: DC
  Administered 2018-02-23: 1000 mL via INTRAVENOUS

## 2018-02-23 MED ORDER — PROPOFOL 500 MG/50ML IV EMUL
INTRAVENOUS | Status: DC | PRN
  Administered 2018-02-23: 50 mg via INTRAVENOUS

## 2018-02-23 MED ORDER — ONDANSETRON HCL 4 MG/2ML IJ SOLN
INTRAMUSCULAR | Status: DC | PRN
  Administered 2018-02-23: 4 mg via INTRAVENOUS

## 2018-02-23 SURGICAL SUPPLY — 15 items

## 2018-02-23 NOTE — Op Note (Signed)
[facility] Patient Name: Amber MottsCrystal Colon Procedure Date: 02/23/2018 MRN: [id number] Attending MD: Rachael Feeaniel P Gayleen Sholtz , MD Date of Birth: 06/11/1981 CSN: [id number] Age: 37 Admit Type: Outpatient Procedure:                Upper GI endoscopy Indications:              Dyspepsia, Dysphagia, Heartburn Providers:                Rachael Feeaniel P. Blayn Whetsell, MD, Jacquiline DoeJennifer Zhu, RN, Madalyn RobMarilyn                            Everhart, Technician Referring MD:              Medicines:                Monitored Anesthesia Care Complications:            No immediate complications. Estimated blood loss:                            None. Estimated Blood Loss:     Estimated blood loss: none. Procedure:                Pre-Anesthesia Assessment:                           - Prior to the procedure, a History and Physical                            was performed, and patient medications and                            allergies were reviewed. The patient's tolerance of                            previous anesthesia was also reviewed. The risks                            and benefits of the procedure and the sedation                            options and risks were discussed with the patient.                            All questions were answered, and informed consent                            was obtained. Prior Anticoagulants: The patient has                            taken no previous anticoagulant or antiplatelet                            agents. ASA Grade Assessment: IV - A patient with                            severe  systemic disease that is a constant threat                            to life. After reviewing the risks and benefits,                            the patient was deemed in satisfactory condition to                            undergo the procedure.                           After obtaining informed consent, the endoscope was                            passed under direct vision. Throughout the                             procedure, the patient's blood pressure, pulse, and                            oxygen saturations were monitored continuously. The                            Endoscope was introduced through the mouth, and                            advanced to the second part of duodenum. The upper                            GI endoscopy was accomplished without difficulty.                            The patient tolerated the procedure well. Scope In: Scope Out: Findings:      The esophagus was normal.      Mild inflammation characterized by erythema and friability was found in       the gastric antrum. Biopsies were taken with a cold forceps for       histology.      The examined duodenum was normal. Impression:               - Normal esophagus.                           - Gastritis. Biopsied to check for H. pylori.                           - Normal examined duodenum. Moderate Sedation:      N/A- Per Anesthesia Care Recommendation:           - Patient has a contact number available for                            emergencies. The signs and symptoms of potential  delayed complications were discussed with the                            patient. Return to normal activities tomorrow.                            Written discharge instructions were provided to the                            patient.                           - Resume previous diet.                           - Continue present medications (protonix 40mg  pill,                            shortly before breakfast every morning).                           - Please start zantac/ranitidine 150mg  pill, one                            pill at bedtime nightly. This is sold "over the                            counter" at any pharmacy and most grocery stores.                           - Await pathology results. Procedure Code(s):        --- Professional ---                           [id number],  Esophagogastroduodenoscopy, flexible,                            transoral; with biopsy, single or multiple Diagnosis Code(s):        --- Professional ---                           K29.70, Gastritis, unspecified, without bleeding                           R10.13, Epigastric pain                           R13.10, Dysphagia, unspecified                           R12, Heartburn CPT copyright 2016 American Medical Association. All rights reserved. The codes documented in this report are preliminary and upon coder review may  be revised to meet current compliance requirements. Rachael Fee, MD 02/23/2018 9:53:40 AM This report has been signed electronically. Number of Addenda: 0

## 2018-02-23 NOTE — Anesthesia Procedure Notes (Signed)
Date/Time: 02/23/2018 9:34 AM Performed by: Thornell MuleStubblefield, Owain Eckerman G, CRNA Oxygen Delivery Method: Nasal cannula

## 2018-02-23 NOTE — Discharge Instructions (Signed)
YOU HAD AN ENDOSCOPIC PROCEDURE TODAY: Refer to the procedure report and other information in the discharge instructions given to you for any specific questions about what was found during the examination. If this information does not answer your questions, please call [place] office at [phone] to clarify.   YOU SHOULD EXPECT: Some feelings of bloating in the abdomen. Passage of more gas than usual. Walking can help get rid of the air that was put into your GI tract during the procedure and reduce the bloating. If you had a lower endoscopy (such as a colonoscopy or flexible sigmoidoscopy) you may notice spotting of blood in your stool or on the toilet paper. Some abdominal soreness may be present for a day or two, also.  DIET: Your first meal following the procedure should be a light meal and then it is ok to progress to your normal diet. A half-sandwich or bowl of soup is an example of a good first meal. Heavy or fried foods are harder to digest and may make you feel nauseous or bloated. Drink plenty of fluids but you should avoid alcoholic beverages for 24 hours. If you had a esophageal dilation, please see attached instructions for diet.    ACTIVITY: Your care partner should take you home directly after the procedure. You should plan to take it easy, moving slowly for the rest of the day. You can resume normal activity the day after the procedure however YOU SHOULD NOT DRIVE, use power tools, machinery or perform tasks that involve climbing or major physical exertion for 24 hours (because of the sedation medicines used during the test).   SYMPTOMS TO REPORT IMMEDIATELY: A gastroenterologist can be reached at any hour. Please call [phone]  for any of the following symptoms:   Following upper endoscopy (EGD, EUS, ERCP, esophageal dilation) Vomiting of blood or coffee ground material  New, significant abdominal pain  New, significant chest pain or pain under the shoulder blades  Painful or  persistently difficult swallowing  New shortness of breath  Black, tarry-looking or red, bloody stools  FOLLOW UP:  If any biopsies were taken you will be contacted by phone or by letter within the next 1-3 weeks. Call [phone]  if you have not heard about the biopsies in 3 weeks.  Please also call with any specific questions about appointments or follow up tests.  

## 2018-02-23 NOTE — Interval H&P Note (Signed)
History and Physical Interval Note:  02/23/2018 9:18 AM  Amber Colon  has presented today for surgery, with the diagnosis of gerd, dysphagia  The various methods of treatment have been discussed with the patient and family. After consideration of risks, benefits and other options for treatment, the patient has consented to  Procedure(s): ESOPHAGOGASTRODUODENOSCOPY (EGD) WITH PROPOFOL (N/A) as a surgical intervention .  The patient's history has been reviewed, patient examined, no change in status, stable for surgery.  I have reviewed the patient's chart and labs.  Questions were answered to the patient's satisfaction.     Rachael Feeaniel P Brennden Masten

## 2018-02-23 NOTE — Anesthesia Postprocedure Evaluation (Signed)
Anesthesia Post Note  Patient: Caya L Hinostroza  Procedure(s) Performed: ESOPHAGOGASTRODUODENOSCOPY (EGD) WITH PROPOFOL (N/A )     Patient location during evaluation: PACU Anesthesia Type: MAC Level of consciousness: awake and alert Pain management: pain level controlled Vital Signs Assessment: post-procedure vital signs reviewed and stable Respiratory status: spontaneous breathing, nonlabored ventilation, respiratory function stable and patient connected to nasal cannula oxygen Cardiovascular status: stable and blood pressure returned to baseline Postop Assessment: no apparent nausea or vomiting Anesthetic complications: no    Last Vitals:  Vitals:   02/23/18 0956 02/23/18 1010  BP: 116/65 97/63  Pulse: 62 62  Resp: 18 15  Temp: (!) 36.3 C   SpO2: 99% 100%    Last Pain:  Vitals:   02/23/18 0956  TempSrc: Oral                 Tiajuana Amass

## 2018-02-23 NOTE — Transfer of Care (Signed)
Immediate Anesthesia Transfer of Care Note  Patient: Amber Colon  Procedure(s) Performed: ESOPHAGOGASTRODUODENOSCOPY (EGD) WITH PROPOFOL (N/A )  Patient Location: PACU  Anesthesia Type:MAC  Level of Consciousness: awake, alert  and oriented  Airway & Oxygen Therapy: Patient Spontanous Breathing and Patient connected to nasal cannula oxygen  Post-op Assessment: Report given to RN and Post -op Vital signs reviewed and stable  Post vital signs: Reviewed and stable  Last Vitals:  Vitals:   02/23/18 0857  BP: (!) 141/86  Pulse: 61  Resp: 20  Temp: 36.6 C  SpO2: 98%    Last Pain:  Vitals:   02/23/18 0857  TempSrc: Oral         Complications: No apparent anesthesia complications

## 2018-02-23 NOTE — Anesthesia Preprocedure Evaluation (Addendum)
Anesthesia Evaluation  Patient identified by MRN, date of birth, ID band Patient awake    Reviewed: Allergy & Precautions, NPO status , Patient's Chart, lab work & pertinent test results  Airway Mallampati: II  TM Distance: >3 FB Neck ROM: Full    Dental   Pulmonary Current Smoker,    breath sounds clear to auscultation       Cardiovascular hypertension, Pt. on medications  Rhythm:Regular Rate:Normal     Neuro/Psych Anxiety Depression    GI/Hepatic Neg liver ROS, GERD  Medicated,  Endo/Other  Hypothyroidism Morbid obesity  Renal/GU negative Renal ROS     Musculoskeletal   Abdominal   Peds  Hematology  (+) anemia ,   Anesthesia Other Findings   Reproductive/Obstetrics                             Anesthesia Physical Anesthesia Plan  ASA: III  Anesthesia Plan: MAC   Post-op Pain Management:    Induction: Intravenous  PONV Risk Score and Plan: 1 and Ondansetron, Propofol infusion and Treatment may vary due to age or medical condition  Airway Management Planned: Natural Airway and Nasal Cannula  Additional Equipment:   Intra-op Plan:   Post-operative Plan:   Informed Consent: I have reviewed the patients History and Physical, chart, labs and discussed the procedure including the risks, benefits and alternatives for the proposed anesthesia with the patient or authorized representative who has indicated his/her understanding and acceptance.     Plan Discussed with:   Anesthesia Plan Comments:         Anesthesia Quick Evaluation

## 2018-02-28 ENCOUNTER — Other Ambulatory Visit: Payer: Self-pay

## 2018-02-28 MED ORDER — BIS SUBCIT-METRONID-TETRACYC 140-125-125 MG PO CAPS: 3.0000 | ORAL_CAPSULE | Freq: Three times a day (TID) | ORAL | 0 refills | Status: DC

## 2018-02-28 MED ORDER — OMEPRAZOLE 20 MG PO CPDR: 20.0000 mg | DELAYED_RELEASE_CAPSULE | Freq: Two times a day (BID) | ORAL | 0 refills | Status: DC

## 2018-03-06 ENCOUNTER — Telehealth: Payer: Self-pay | Admitting: Gastroenterology

## 2018-03-07 NOTE — Telephone Encounter (Signed)
Tried to call [facility] pharmacy twice line busy

## 2018-03-07 NOTE — Telephone Encounter (Signed)
The pharmacy was advised to prescribe 10 days and 1 refill and the pt aware to take additional 4 days from the refill

## 2018-03-07 NOTE — Telephone Encounter (Signed)
Pharmacy states medication pylera needs to be sent in in 10 day increments.

## 2018-03-16 ENCOUNTER — Ambulatory Visit (HOSPITAL_COMMUNITY): Payer: Medicaid Other | Admitting: Psychiatry

## 2018-04-07 ENCOUNTER — Other Ambulatory Visit: Payer: Self-pay | Admitting: Pediatrics

## 2018-04-07 DIAGNOSIS — I10 Essential (primary) hypertension: Secondary | ICD-10-CM

## 2018-04-24 ENCOUNTER — Ambulatory Visit: Payer: Self-pay | Admitting: Gastroenterology

## 2018-04-25 ENCOUNTER — Telehealth: Payer: Self-pay | Admitting: Pediatrics

## 2018-04-25 ENCOUNTER — Telehealth: Payer: Self-pay | Admitting: Gastroenterology

## 2018-04-26 ENCOUNTER — Other Ambulatory Visit: Payer: Self-pay | Admitting: Pediatrics

## 2018-04-26 DIAGNOSIS — I10 Essential (primary) hypertension: Secondary | ICD-10-CM

## 2018-04-26 MED ORDER — LABETALOL HCL 100 MG PO TABS: 100.0000 mg | ORAL_TABLET | Freq: Two times a day (BID) | ORAL | 0 refills | Status: DC

## 2018-04-26 NOTE — Telephone Encounter (Signed)
Pt aware refill sent to [facility] 

## 2018-05-01 ENCOUNTER — Ambulatory Visit (INDEPENDENT_AMBULATORY_CARE_PROVIDER_SITE_OTHER): Payer: Self-pay | Admitting: Otolaryngology

## 2018-05-11 ENCOUNTER — Other Ambulatory Visit: Payer: Self-pay | Admitting: Pediatrics

## 2018-05-11 DIAGNOSIS — F410 Panic disorder [episodic paroxysmal anxiety] without agoraphobia: Secondary | ICD-10-CM

## 2018-05-11 MED ORDER — FLUOXETINE HCL 20 MG PO TABS: 40.0000 mg | ORAL_TABLET | Freq: Every day | ORAL | 3 refills | Status: DC

## 2018-05-11 NOTE — Telephone Encounter (Signed)
Pt is needing a refill on FLUoxetine (PROZAC) 20 MG tablet [facility]

## 2018-05-25 ENCOUNTER — Ambulatory Visit (INDEPENDENT_AMBULATORY_CARE_PROVIDER_SITE_OTHER): Payer: Medicaid Other | Admitting: Otolaryngology

## 2018-05-25 DIAGNOSIS — H95123 Granulation of postmastoidectomy cavity, bilateral ears: Secondary | ICD-10-CM | POA: Diagnosis not present

## 2018-05-26 ENCOUNTER — Other Ambulatory Visit: Payer: Self-pay | Admitting: Pediatrics

## 2018-05-26 DIAGNOSIS — K219 Gastro-esophageal reflux disease without esophagitis: Secondary | ICD-10-CM

## 2018-05-26 NOTE — Telephone Encounter (Signed)
Message left  for patient to call on May 7,2019.  No response.  Note filed.

## 2018-07-04 ENCOUNTER — Ambulatory Visit: Payer: Medicaid Other | Admitting: Family Medicine

## 2018-07-04 VITALS — BP 107/70 | HR 82 | Temp 98.3°F | Ht 65.0 in | Wt 303.0 lb

## 2018-07-04 DIAGNOSIS — R42 Dizziness and giddiness: Secondary | ICD-10-CM

## 2018-07-04 LAB — BAYER DCA HB A1C WAIVED: HB A1C (BAYER DCA - WAIVED): 5.2 % (ref <7.0)

## 2018-07-04 NOTE — Progress Notes (Signed)
Subjective: CC: dizzy PCP: Johna SheriffVincent, Carol L, MD ZOX:[id number]:Amber Colon is a 37 y.o. female presenting to clinic today for:  1. Dizzy Patient reports that she has been feeling intermittently dizzy for the last several days.  She describes as lightheaded.  Denies any room spinning.  Denies any associated nausea, vomiting, weakness, confusion or neurologic deficits.  She does note that she has had a few intermittent episodes of nonbloody diarrhea.  She reports that oral intake and hydration has been good, citing that she uses Gatorade and ginger ale.  She states that symptoms seem to be worse at work and if she gets hot.  Staying at home and resting relieves symptoms.  Denies worsening of symptoms with certain movements or head turns.  No falls or loss of consciousness.  She is currently treated with labetalol 100 mg twice daily for blood pressures.  Her blood pressures remained stable.  Additionally, she has been seen by cardiology previously for atypical chest pain and had stress testing which was low risk.  She denies any chest pain or shortness of breath.  No abnormal vaginal bleeding or bleeding from any orifice.    ROS: Per HPI  Allergies  Allergen Reactions  . Amoxicillin Diarrhea, Nausea And Vomiting and Other (See Comments)    Has patient had a PCN reaction causing immediate rash, facial/tongue/throat swelling, SOB or lightheadedness with hypotension: No Has patient had a PCN reaction causing severe rash involving mucus membranes or skin necrosis: No Has patient had a PCN reaction that required hospitalization: No Has patient had a PCN reaction occurring within the last 10 years: No If all of the above answers are "NO", then may proceed with Cephalosporin use.    Past Medical History:  Diagnosis Date  . Anemia    not taking her iron pills either  . Anxiety   . Depression   . Gallstones   . GERD (gastroesophageal reflux disease)   . Headache    "occasional"  . History of blood  transfusion    blood after miscarrige  . Hypertension    doesn't take her bp pills  . Hypothyroidism    again, takes no med  . Obesity   . Panic attack     Current Outpatient Medications:  .  acetaminophen (TYLENOL) 500 MG tablet, Take 500 mg by mouth every 6 (six) hours as needed for moderate pain or headache., Disp: , Rfl:  .  aspirin EC 81 MG tablet, Take 81 mg by mouth daily as needed (for chest pain)., Disp: , Rfl:  .  bismuth-metronidazole-tetracycline (PYLERA) 140-125-125 MG capsule, Take 3 capsules by mouth 4 (four) times daily -  before meals and at bedtime., Disp: 168 capsule, Rfl: 0 .  colestipol (COLESTID) 1 g tablet, TAKE 2 TABLETS BY MOUTH TWICE DAILY (Patient taking differently: TAKE 2 G BY MOUTH TWICE DAILY), Disp: 120 tablet, Rfl: 2 .  FLUoxetine (PROZAC) 20 MG tablet, Take 2 tablets (40 mg total) by mouth daily., Disp: 60 tablet, Rfl: 3 .  labetalol (NORMODYNE) 100 MG tablet, Take 1 tablet (100 mg total) by mouth 2 (two) times daily., Disp: 180 tablet, Rfl: 0 .  levothyroxine (SYNTHROID, LEVOTHROID) 75 MCG tablet, Take 1 tablet (75 mcg total) by mouth daily., Disp: 90 tablet, Rfl: 1 .  omeprazole (PRILOSEC) 20 MG capsule, Take 1 capsule (20 mg total) by mouth 2 (two) times daily before a meal., Disp: 28 capsule, Rfl: 0 .  pantoprazole (PROTONIX) 40 MG tablet, TAKE 1 TABLET BY  MOUTH ONCE DAILY, Disp: 90 tablet, Rfl: 0 Social History   Socioeconomic History  . Marital status: Married    Spouse name: Not on file  . Number of children: 3  . Years of education: Not on file  . Highest education level: Not on file  Occupational History  . Occupation: stay at home mom  Social Needs  . Financial resource strain: Not on file  . Food insecurity:    Worry: Not on file    Inability: Not on file  . Transportation needs:    Medical: Not on file    Non-medical: Not on file  Tobacco Use  . Smoking status: Current Some Day Smoker    Packs/day: 0.25    Years: 2.00    Pack  years: 0.50    Types: Cigarettes  . Smokeless tobacco: Never Used  . Tobacco comment: 3-4 cigs a day  Substance and Sexual Activity  . Alcohol use: No  . Drug use: No  . Sexual activity: Not on file    Comment: only smokes about 2 cigrettes a day/trying to quit  Lifestyle  . Physical activity:    Days per week: Not on file    Minutes per session: Not on file  . Stress: Not on file  Relationships  . Social connections:    Talks on phone: Not on file    Gets together: Not on file    Attends religious service: Not on file    Active member of club or organization: Not on file    Attends meetings of clubs or organizations: Not on file    Relationship status: Not on file  . Intimate partner violence:    Fear of current or ex partner: Not on file    Emotionally abused: Not on file    Physically abused: Not on file    Forced sexual activity: Not on file  Other Topics Concern  . Not on file  Social History Narrative  . Not on file   Family History  Problem Relation Age of Onset  . Diabetes Mother   . Heart disease Mother   . Ovarian cancer Mother   . Stroke Mother   . Hypertension Mother   . Diabetes Father   . COPD Father   . Heart disease Father   . Anuerysm Father        Neck, stomach  . Breast cancer Maternal Grandmother   . Diabetes Maternal Grandfather   . Diabetes Paternal Aunt   . Diabetes Paternal Uncle     Objective: Office vital signs reviewed. BP 107/70   Pulse 82   Temp 98.3 F (36.8 C) (Oral)   Ht 5\' 5"  (1.651 m)   Wt (!) 303 lb (137.4 kg)   BMI 50.42 kg/m   Physical Examination:  General: Awake, alert, morbidly obese, No acute distress HEENT: Normal    Neck: No masses palpated. No lymphadenopathy    Eyes: PERRLA, extraocular membranes intact, sclera white; mild conjunctival pallor.    Nose:  no nasal discharge    Throat: moist mucus membranes, no erythema, no tonsillar exudate.  Airway is patent Cardio: regular rate and rhythm, S1S2 heard, no  murmurs appreciated Pulm: clear to auscultation bilaterally, no wheezes, rhonchi or rales; normal work of breathing on room air Extremities: warm, well perfused, No edema, cyanosis or clubbing; +2 pulses bilaterally MSK: normal gait and normal station Skin: dry; intact; no rashes or lesions Neuro: Cranial nerves II through XII grossly intact.  Orthostatic VS  for the past 24 hrs:  BP- Lying Pulse- Lying BP- Sitting Pulse- Sitting BP- Standing at 0 minutes Pulse- Standing at 0 minutes  07/04/18 1045 107/70 82 93/65 85 109/86 85    Assessment/ Plan: 37 y.o. female   1. Dizziness Patient is afebrile and nontoxic-appearing with normal vital signs.  Patient does have about a 14 point drop in her systolic blood pressure from lying to sitting positions.  However, her symptoms do not seem consistent with orthostatic hypotension.  I worry that she perhaps is getting heat exhaustion since heat and physical activity seem to worsen symptoms.  Her blood pressure is on the lower side of normal; however blood pressures have been stable over last few office visits and she was asymptomatic at previous visits.  Could consider reducing labetalol if symptoms persist.  Her physical exam was remarkable for mild conjunctival pallor.  Will check CBC, TSH, A1c and BMP to rule out metabolic etiology.  In the meantime, encouraged hydration and rest.  Will excuse from work for the next 2 days.  She will schedule follow-up with her PCP to be rechecked in 1 week. - Basic Metabolic Panel - CBC with Differential - TSH - Bayer DCA Hb A1c Waived  2. Morbid obesity (HCC) - Bayer DCA Hb A1c Waived   Orders Placed This Encounter  Procedures  . Basic Metabolic Panel  . CBC with Differential  . TSH  . Bayer [facility] Hb A1c Waived    Paden Senger Hulen Skains, DO [facility] [phone]

## 2018-07-04 NOTE — Patient Instructions (Addendum)
Your symptoms may be a result of overheating and dehydration.  I have given you a couple days off of work.  Follow up with Dr ViOswaldo Donencent in 1 week for recheck.  We may need to consider decreasing your blood pressure medication.  You had labs performed today.  You will be contacted with the results of the labs once they are available, usually in the next 3 business days for routine lab work.    Heat Exhaustion Information WHAT IS HEAT EXHAUSTION? Heat exhaustion happens when your body gets overheated from hot weather or from exercise. Heat exhaustion can lead to heat stroke, a life-threatening condition that requires emergency care. Heat exhaustion is more likely to develop when:  You are exercising or being active.  You are in hot or humid weather.  You are in bright sunshine.  You are not drinking enough water.  WHO IS AT RISK FOR THIS CONDITION? This condition is more likely to develop in:  People who exercise in hot or humid weather.  People who exercise beyond their fitness level.  People who wear clothing that does not allow sweat to evaporate.  People who are dehydrated.  People who drink a lot of alcoholic beverages or beverages that have caffeine. This can lead to dehydration.  People who are age 37 or older.  Children.  People who have a medical condition such as heart disease, poor circulation, sickle cell disease, or high blood pressure.  People who have a fever.  People who are very overweight (obese).  WHAT ARE THE SYMPTOMS OF THIS CONDITION? Symptoms of heat exhaustion include:  Heavy sweating along with feeling weak, dizzy, light-headed, and nauseous.  Rapid heartbeat.  Headache.  Urine that is darker than normal.  Muscle cramps, such as in the leg or side (flank).  Moist, cool, and clammy skin.  Fatigue.  Thirst.  Confusion.  Fainting.  WHAT SHOULD I DO IF I THINK I HAVE THIS CONDITION? If you think that you have heat exhaustion, call your  health care provider. Follow his or her instructions. You should also:  Call a friend or a family member and ask him or her to stay with you.  Move to a cooler location, such as: ? Into the shade. ? In front of a fan. ? An air-conditioned space.  Lie down and rest.  Slowly drink nonalcoholic, caffeine-free fluids.  Take off tight clothing or extra clothing.  Take a cool bath or shower, if possible. If you do not have access to a bath or shower, dab or mist cool water on your skin.  WHY IS IT IMPORTANT TO TREAT THIS CONDITION? It is important to take care of yourself and treat heat exhaustion as soon as possible. Untreated heat exhaustion can turn into heat stroke, which is a life-threatening condition that requires urgent medical treatment. HOW CAN I PREVENT THIS CONDITION? To prevent this condition:  Drink enough fluid to keep your urine clear or pale yellow. This helps your body to sweat properly.  Avoid outdoor activities on very hot or humid days.  Do not exercise or do other physical activity when you are not feeling well.  Take breaks often during physical activity.  Wear light-colored, loose-fitting, and lightweight clothing when it is hot outside.  Wear a hat and use sunscreen when exercising outdoors.  Avoid being outside during the hottest times of the day.  Check with your health care provider before you start any new activity, especially if you take medicine or have a  medical condition.  Start any new activity slowly and work up to your fitness level.  HOW CAN I HELP TO PROTECT ELDERLY RELATIVES AND NEIGHBORS FROM THIS CONDITION? People who are age 8 or older are at greater risk for heat exhaustion. Their bodies have a harder time adjusting to heat. They are also more likely to have a medical condition or be on medicines that increase their risk for heat exhaustion. They may get heat exhaustion indoors if the heat is high for several days. You can help to protect  them during hot weather by:  Checking on them two or more times each day.  Making sure that they are drinking plenty of cool, nonalcoholic, and caffeine-free fluids.  Making sure that they use their air conditioner.  Taking them to a location where air conditioning is available.  Talking with their health care provider about their medical needs, medicines, and fluid requirements.  SEEK MEDICAL CARE IF:  Your symptoms last longer than 30 minutes.  SEEK IMMEDIATE MEDICAL CARE IF:  You have any symptoms of heat stroke. These include: ? Fever. ? Vomiting. ? Red skin. ? Inability to sweat, resulting in hot, dry skin. ? Excessive thirst. ? Rapid breathing. ? Headache. ? Confusion or disorientation. ? Fainting. ? Seizures. These symptoms may represent a serious problem that is an emergency. Do not wait to see if the symptoms will go away. Get medical help right away. Call your local emergency services (911 in the [place] Do not drive yourself to the hospital. This information is not intended to replace advice given to you by your health care provider. Make sure you discuss any questions you have with your health care provider. Document Released: 09/14/2008 Document Revised: 06/25/2016 Document Reviewed: 03/28/2016 Elsevier Interactive Patient Education  2018 [employer]. Dizziness Dizziness is a common problem. It makes you feel unsteady or light-headed. You may feel like you are about to pass out (faint). Dizziness can lead to getting hurt if you stumble or fall. Dizziness can be caused by many things, including:  Medicines.  Not having enough water in your body (dehydration).  Illness.  Follow these instructions at home: Eating and drinking  Drink enough fluid to keep your pee (urine) clear or pale yellow. This helps to keep you from getting dehydrated. Try to drink more clear fluids, such as water.  Do not drink alcohol.  Limit how much caffeine you drink or eat, if your  doctor tells you to do that.  Limit how much salt (sodium) you drink or eat, if your doctor tells you to do that. Activity  Avoid making quick movements. ? When you stand up from sitting in a chair, steady yourself until you feel okay. ? In the morning, first sit up on the side of the bed. When you feel okay, stand slowly while you hold onto something. Do this until you know that your balance is fine.  If you need to stand in one place for a long time, move your legs often. Tighten and relax the muscles in your legs while you are standing.  Do not drive or use heavy machinery if you feel dizzy.  Avoid bending down if you feel dizzy. Place items in your home so you can reach them easily without leaning over. Lifestyle  Do not use any products that contain nicotine or tobacco, such as cigarettes and e-cigarettes. If you need help quitting, ask your doctor.  Try to lower your stress level. You can do this by using  methods such as yoga or meditation. Talk with your doctor if you need help. General instructions  Watch your dizziness for any changes.  Take over-the-counter and prescription medicines only as told by your doctor. Talk with your doctor if you think that you are dizzy because of a medicine that you are taking.  Tell a friend or a family member that you are feeling dizzy. If he or she notices any changes in your behavior, have this person call your doctor.  Keep all follow-up visits as told by your doctor. This is important. Contact a doctor if:  Your dizziness does not go away.  Your dizziness or light-headedness gets worse.  You feel sick to your stomach (nauseous).  You have trouble hearing.  You have new symptoms.  You are unsteady on your feet.  You feel like the room is spinning. Get help right away if:  You throw up (vomit) or have watery poop (diarrhea), and you cannot eat or drink anything.  You have  trouble: ? Talking. ? Walking. ? Swallowing. ? Using your arms, hands, or legs.  You feel generally weak.  You are not thinking clearly, or you have trouble forming sentences. A friend or family member may notice this.  You have: ? Chest pain. ? Pain in your belly (abdomen). ? Shortness of breath. ? Sweating.  Your vision changes.  You are bleeding.  You have a very bad headache.  You have neck pain or a stiff neck.  You have a fever. These symptoms may be an emergency. Do not wait to see if the symptoms will go away. Get medical help right away. Call your local emergency services (911 in the [place] Do not drive yourself to the hospital. Summary  Dizziness makes you feel unsteady or light-headed. You may feel like you are about to pass out (faint).  Drink enough fluid to keep your pee (urine) clear or pale yellow. Do not drink alcohol.  Avoid making quick movements if you feel dizzy.  Watch your dizziness for any changes. This information is not intended to replace advice given to you by your health care provider. Make sure you discuss any questions you have with your health care provider. Document Released: 11/25/2011 Document Revised: 12/23/2016 Document Reviewed: 12/23/2016 Elsevier Interactive Patient Education  2017 [employer].

## 2018-07-05 ENCOUNTER — Other Ambulatory Visit: Payer: Self-pay | Admitting: Family Medicine

## 2018-07-05 LAB — CBC WITH DIFFERENTIAL/PLATELET
BASOS: 2 %
Basophils Absolute: 0.1 10*3/uL (ref 0.0–0.2)
EOS (ABSOLUTE): 0.1 10*3/uL (ref 0.0–0.4)
Eos: 3 %
Hematocrit: 32.3 % — ABNORMAL LOW (ref 34.0–46.6)
Hemoglobin: 10.6 g/dL — ABNORMAL LOW (ref 11.1–15.9)
IMMATURE GRANULOCYTES: 1 %
Immature Grans (Abs): 0 10*3/uL (ref 0.0–0.1)
LYMPHS ABS: 1.6 10*3/uL (ref 0.7–3.1)
Lymphs: 42 %
MCH: 23.4 pg — AB (ref 26.6–33.0)
MCHC: 32.8 g/dL (ref 31.5–35.7)
MCV: 71 fL — AB (ref 79–97)
MONOS ABS: 0.3 10*3/uL (ref 0.1–0.9)
Monocytes: 7 %
NEUTROS PCT: 45 %
Neutrophils Absolute: 1.7 10*3/uL (ref 1.4–7.0)
Platelets: 136 10*3/uL — ABNORMAL LOW (ref 150–450)
RBC: 4.53 x10E6/uL (ref 3.77–5.28)
RDW: 17.5 % — AB (ref 12.3–15.4)
WBC: 3.8 10*3/uL (ref 3.4–10.8)

## 2018-07-05 LAB — BASIC METABOLIC PANEL
BUN/Creatinine Ratio: 7 — ABNORMAL LOW (ref 9–23)
BUN: 7 mg/dL (ref 6–20)
CO2: 19 mmol/L — AB (ref 20–29)
Calcium: 9.1 mg/dL (ref 8.7–10.2)
Chloride: 102 mmol/L (ref 96–106)
Creatinine, Ser: 0.98 mg/dL (ref 0.57–1.00)
GFR calc Af Amer: 86 mL/min/{1.73_m2} (ref >59)
GFR calc non Af Amer: 74 mL/min/{1.73_m2} (ref >59)
GLUCOSE: 96 mg/dL (ref 65–99)
POTASSIUM: 3.5 mmol/L (ref 3.5–5.2)
SODIUM: 139 mmol/L (ref 134–144)

## 2018-07-05 LAB — TSH: TSH: 1.94 u[IU]/mL (ref 0.450–4.500)

## 2018-07-05 MED ORDER — FERROUS SULFATE 324 (65 FE) MG PO TBEC: 1.0000 | DELAYED_RELEASE_TABLET | Freq: Every day | ORAL | 3 refills | Status: DC

## 2018-07-12 ENCOUNTER — Encounter: Payer: Self-pay | Admitting: Pediatrics

## 2018-07-12 ENCOUNTER — Ambulatory Visit: Payer: Medicaid Other | Admitting: Pediatrics

## 2018-07-12 VITALS — BP 99/66 | HR 85 | Temp 97.7°F | Ht 65.0 in | Wt 304.2 lb

## 2018-07-12 DIAGNOSIS — I10 Essential (primary) hypertension: Secondary | ICD-10-CM

## 2018-07-12 DIAGNOSIS — I959 Hypotension, unspecified: Secondary | ICD-10-CM | POA: Diagnosis not present

## 2018-07-12 DIAGNOSIS — F410 Panic disorder [episodic paroxysmal anxiety] without agoraphobia: Secondary | ICD-10-CM

## 2018-07-12 MED ORDER — FLUOXETINE HCL 20 MG PO TABS: 60.0000 mg | ORAL_TABLET | Freq: Every day | ORAL | 3 refills | Status: DC

## 2018-07-12 NOTE — Patient Instructions (Addendum)
Check blood pressures at home. Let me know if regularly > 140 on top or >90 on bottom.  Come back for nurse visit blood pressure check in 2 weeks.

## 2018-07-12 NOTE — Progress Notes (Signed)
  Subjective:   Patient ID: Amber Colon, female    DOB: 10/29/1981, 37 y.o.   MRN: [id number] CC: Follow-up (1 week, dizziness)  HPI: Amber Colon is a 37 y.o. female here with dizziness/lightheadedness.  Does not feel like the room is spinning. Feels like she might faint, worse when she stands up.   Taking her blood pressure medicine regularly, no chest pressure or chest pain.  No headaches.  Does not check her blood pressure regularly at home.  She has been working at losing weight over the last few weeks to months.  Panic attacks: She thinks anxiety overall is in a better place.  She does not have panic attacks as much she used to.  She does continue to have some anxiety day-to-day, she wonders about increasing medicine she is on right now.  She thinks it has been helping quite a bit.  Relevant past medical, surgical, family and social history reviewed. Allergies and medications reviewed and updated. Social History   Tobacco Use  Smoking Status Current Some Day Smoker  . Packs/day: 0.25  . Years: 2.00  . Pack years: 0.50  . Types: Cigarettes  Smokeless Tobacco Never Used  Tobacco Comment   3-4 cigs a day   ROS: Per HPI   Objective:    BP 99/66   Pulse 85   Temp 97.7 F (36.5 C) (Oral)   Ht 5\' 5"  (1.651 m)   Wt (!) 304 lb 3.2 oz (138 kg)   BMI 50.62 kg/m   Wt Readings from Last 3 Encounters:  07/12/18 (!) 304 lb 3.2 oz (138 kg)  07/04/18 (!) 303 lb (137.4 kg)  02/23/18 (!) 314 lb (142.4 kg)    Gen: NAD, alert, cooperative with exam, NCAT EYES: EOMI, no conjunctival injection, or no icterus ENT:  TMs pearly gray b/l, OP without erythema LYMPH: no cervical LAD CV: NRRR, normal S1/S2, no murmur, distal pulses 2+ b/l Resp: CTABL, no wheezes, normal WOB Abd: +BS, soft, NTND. no guarding or organomegaly Ext: No edema, warm Neuro: Alert and oriented, strength equal b/l UE and LE, coordination grossly normal MSK: normal muscle bulk  Assessment & Plan:  Amber Colon  was seen today for follow-up lightheadedness.  Diagnoses and all orders for this visit:  Panic attacks Some ongoing symptoms of anxiety, will increase to 60 mg. -     FLUoxetine (PROZAC) 20 MG tablet; Take 3 tablets (60 mg total) by mouth daily.  Essential hypertension Hypotension, unspecified hypotension type Low blood pressure today.  Will stop Labetalol.  Blood pressure low today.  Suspect contributing to symptoms.  If not improving let me know.  Check blood pressures at home.  Let me know if regularly greater than 140 on top or greater than 90 on bottom.  Continue healthy lifestyle, regular walking, increasing fruit and vegetable intake.  Follow up plan: Return in about 2 months (around 09/12/2018).  For next office visit Return in 2 weeks for nurse visit for blood pressure check. Rex Krasarol Vincent, MD [facility]

## 2018-07-13 ENCOUNTER — Encounter: Payer: Self-pay | Admitting: Pediatrics

## 2018-08-07 ENCOUNTER — Other Ambulatory Visit: Payer: Self-pay | Admitting: Pediatrics

## 2018-08-07 DIAGNOSIS — Z9049 Acquired absence of other specified parts of digestive tract: Principal | ICD-10-CM

## 2018-08-07 DIAGNOSIS — R197 Diarrhea, unspecified: Secondary | ICD-10-CM

## 2018-08-09 ENCOUNTER — Encounter: Payer: Self-pay | Admitting: Family Medicine

## 2018-08-09 ENCOUNTER — Ambulatory Visit: Payer: Medicaid Other | Admitting: Family Medicine

## 2018-08-09 VITALS — BP 113/82 | HR 80 | Temp 97.2°F | Ht 65.0 in | Wt 304.4 lb

## 2018-08-09 DIAGNOSIS — J329 Chronic sinusitis, unspecified: Secondary | ICD-10-CM | POA: Diagnosis not present

## 2018-08-09 DIAGNOSIS — J40 Bronchitis, not specified as acute or chronic: Secondary | ICD-10-CM | POA: Diagnosis not present

## 2018-08-09 MED ORDER — SULFAMETHOXAZOLE-TRIMETHOPRIM 800-160 MG PO TABS: 1.0000 | ORAL_TABLET | Freq: Two times a day (BID) | ORAL | 0 refills | Status: AC

## 2018-08-09 MED ORDER — PSEUDOEPHEDRINE-GUAIFENESIN ER 120-1200 MG PO TB12: ORAL_TABLET | ORAL | 0 refills | Status: DC

## 2018-08-09 MED ORDER — BETAMETHASONE SOD PHOS & ACET 6 (3-3) MG/ML IJ SUSP
6.0000 mg | Freq: Once | INTRAMUSCULAR | Status: AC
  Administered 2018-08-09: 6 mg via INTRAMUSCULAR

## 2018-08-09 NOTE — Progress Notes (Signed)
Chief Complaint  Patient presents with  . Cough    pt here today c/o cough, congestion, ears stopped up and "feels terrible"    HPI  Patient presents today for Patient presents with upper respiratory congestion. Rhinorrhea that is frequently purulent. There is moderate sore throat. Patient reports coughing frequently as well.  SCant but purulent sputum noted. There is no fever, chills or sweats. The patient denies being short of breath. Onset was 3-5 days ago. Gradually worsening. Tried OTCs without improvement.  PMH: Smoking status noted ROS: Per HPI  Objective: BP 113/82   Pulse 80   Temp (!) 97.2 F (36.2 C) (Oral)   Ht 5\' 5"  (1.651 m)   Wt (!) 304 lb 6 oz (138.1 kg)   BMI 50.65 kg/m  Gen: NAD, alert, cooperative with exam HEENT: NCAT, Nasal passages swollen, red TMS RED CV: RRR, good S1/S2, no murmur Resp: Bronchitis changes with scattered wheezes, non-labored Ext: No edema, warm Neuro: Alert and oriented, No gross deficits  Assessment and plan:  1. Sinobronchitis     Meds ordered this encounter  Medications  . betamethasone acetate-betamethasone sodium phosphate (CELESTONE) injection 6 mg  . sulfamethoxazole-trimethoprim (BACTRIM DS) 800-160 MG tablet    Sig: Take 1 tablet by mouth 2 (two) times daily for 10 days.    Dispense:  20 tablet    Refill:  0  . Pseudoephedrine-Guaifenesin (MUCINEX D MAX STRENGTH) [phone] MG TB12    Sig: Take 1 by mouth twice daily as needed for congestion.    Dispense:  12 each    Refill:  0      Follow up as needed.  Mechele ClaudeWarren Akeelah Seppala, MD

## 2018-08-27 ENCOUNTER — Other Ambulatory Visit: Payer: Self-pay | Admitting: Pediatrics

## 2018-08-27 DIAGNOSIS — F410 Panic disorder [episodic paroxysmal anxiety] without agoraphobia: Secondary | ICD-10-CM

## 2018-09-05 ENCOUNTER — Telehealth: Payer: Self-pay | Admitting: Pediatrics

## 2018-09-12 ENCOUNTER — Ambulatory Visit (INDEPENDENT_AMBULATORY_CARE_PROVIDER_SITE_OTHER): Payer: Medicaid Other

## 2018-09-12 ENCOUNTER — Encounter: Payer: Self-pay | Admitting: Pediatrics

## 2018-09-12 ENCOUNTER — Ambulatory Visit: Payer: Medicaid Other | Admitting: Pediatrics

## 2018-09-12 VITALS — BP 110/68 | HR 68 | Temp 98.0°F | Ht 65.0 in | Wt 317.0 lb

## 2018-09-12 DIAGNOSIS — E039 Hypothyroidism, unspecified: Secondary | ICD-10-CM | POA: Diagnosis not present

## 2018-09-12 DIAGNOSIS — M79671 Pain in right foot: Secondary | ICD-10-CM | POA: Diagnosis not present

## 2018-09-12 DIAGNOSIS — K219 Gastro-esophageal reflux disease without esophagitis: Secondary | ICD-10-CM

## 2018-09-12 DIAGNOSIS — F410 Panic disorder [episodic paroxysmal anxiety] without agoraphobia: Secondary | ICD-10-CM | POA: Diagnosis not present

## 2018-09-12 MED ORDER — PANTOPRAZOLE SODIUM 40 MG PO TBEC: 40.0000 mg | DELAYED_RELEASE_TABLET | Freq: Every day | ORAL | 1 refills | Status: DC

## 2018-09-12 MED ORDER — FLUOXETINE HCL 20 MG PO TABS: 60.0000 mg | ORAL_TABLET | Freq: Every day | ORAL | 3 refills | Status: DC

## 2018-09-12 MED ORDER — LEVOTHYROXINE SODIUM 50 MCG PO TABS: 50.0000 ug | ORAL_TABLET | Freq: Every day | ORAL | 4 refills | Status: DC

## 2018-09-12 NOTE — Progress Notes (Signed)
  Subjective:   Patient ID: Amber Colon, female    DOB: 04/23/1981, 37 y.o.   MRN: [id number] CC: Blood Pressure Check  HPI: Amber Colon is a 37 y.o. female   Remains off of blood pressure medicine.  She ran out of her anxiety medicine 2 weeks ago.  No side effects from stopping it.  She was feeling jittery on the thyroid medicine so that was stopped several weeks ago as well.  She has gained some weight back since stopping the thyroid medicine.  No panic attacks in the last several weeks.  She feels like her anxiety is doing all right.  She is having some right heel pain.  She has been wearing supportive shoes with inserts in them better support.  Relevant past medical, surgical, family and social history reviewed. Allergies and medications reviewed and updated. Social History   Tobacco Use  Smoking Status Current Some Day Smoker  . Packs/day: 0.25  . Years: 2.00  . Pack years: 0.50  . Types: Cigarettes  Smokeless Tobacco Never Used  Tobacco Comment   3-4 cigs a day   ROS: Per HPI   Objective:    BP 110/68   Pulse 68   Temp 98 F (36.7 C) (Oral)   Ht 5\' 5"  (1.651 m)   Wt (!) 317 lb (143.8 kg)   BMI 52.75 kg/m   Wt Readings from Last 3 Encounters:  09/12/18 (!) 317 lb (143.8 kg)  08/09/18 (!) 304 lb 6 oz (138.1 kg)  07/12/18 (!) 304 lb 3.2 oz (138 kg)   Gen: NAD, alert, cooperative with exam, NCAT EYES: EOMI, no conjunctival injection, or no icterus CV: NRRR, normal S1/S2, no murmur, distal pulses 2+ b/l Resp: CTABL, no wheezes, normal WOB Abd: +BS, soft, NTND. no guarding or organomegaly Ext: No edema, warm Neuro: Alert and oriented MSK: Right heel mildly tender with heel squeeze.  No tenderness over Achilles tendon.  No tenderness over arch, plantar fascia insertion. Psych: Normal affect, mood has been good  Assessment & Plan:  Amber Colon was seen today for blood pressure check.  Diagnoses and all orders for this visit:  Pain of right heel Will have pt  see podiatry -     DG Foot Complete Right; Future -     Ambulatory referral to Podiatry  Gastroesophageal reflux disease, esophagitis presence not specified Stable, cont below -     pantoprazole (PROTONIX) 40 MG tablet; Take 1 tablet (40 mg total) by mouth daily.  Panic attacks Restart below, has been working well for panic attacks. Increase 1 tab every 3-4 days until back at 60mg  daily -     FLUoxetine (PROZAC) 20 MG tablet; Take 3 tablets (60 mg total) by mouth daily.  Hypothyroidism, unspecified type Repeat tsh next visit -     levothyroxine (SYNTHROID, LEVOTHROID) 50 MCG tablet; Take 1 tablet (50 mcg total) by mouth daily.   Follow up plan: Return in about 3 months (around 12/12/2018). Rex Krasarol Vincent, MD [facility]

## 2018-10-09 ENCOUNTER — Ambulatory Visit (INDEPENDENT_AMBULATORY_CARE_PROVIDER_SITE_OTHER): Payer: Self-pay | Admitting: Otolaryngology

## 2018-10-10 ENCOUNTER — Encounter: Payer: Self-pay | Admitting: Sports Medicine

## 2018-10-10 ENCOUNTER — Ambulatory Visit: Payer: Medicaid Other | Admitting: Sports Medicine

## 2018-10-10 ENCOUNTER — Ambulatory Visit: Payer: Medicaid Other

## 2018-10-10 VITALS — Resp 16

## 2018-10-10 DIAGNOSIS — Z6841 Body Mass Index (BMI) 40.0 and over, adult: Secondary | ICD-10-CM

## 2018-10-10 DIAGNOSIS — M722 Plantar fascial fibromatosis: Secondary | ICD-10-CM | POA: Diagnosis not present

## 2018-10-10 DIAGNOSIS — M79671 Pain in right foot: Secondary | ICD-10-CM

## 2018-10-10 MED ORDER — METHYLPREDNISOLONE 4 MG PO TBPK: ORAL_TABLET | ORAL | 0 refills | Status: DC

## 2018-10-10 MED ORDER — TRIAMCINOLONE ACETONIDE 10 MG/ML IJ SUSP: 10.0000 mg | Freq: Once | INTRAMUSCULAR | Status: DC

## 2018-10-10 NOTE — Progress Notes (Signed)
Subjective: Amber Colon is a 37 y.o. female patient presents to office with complaint of moderate heel pain on the right. Patient admits to post static dyskinesia since April that is worsening "hurts all day". Patient has treated this problem with motrin, OTC orthotics, stretching, icing with no relief. Denies any other pedal complaints.   Review of Systems  All other systems reviewed and are negative.    Patient Active Problem List   Diagnosis Date Noted  . Dysphagia   . Gastritis and gastroduodenitis   . Family history of early CAD 09/20/2017  . 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 Depression 12/24/2016  . S/P cholecystectomy 09/23/2016  . Current smoker 02/27/2016  . GAD (generalized anxiety disorder) 01/22/2016  . GERD (gastroesophageal reflux disease) 01/22/2016  . Morbid obesity with BMI of 50.0-59.9, adult (HCC) 01/22/2016  . Metabolic syndrome 01/22/2016  . Hypothyroidism 06/18/2015  . Anemia, iron deficiency 06/18/2015  . Essential hypertension 06/18/2015    Current Outpatient Medications on File Prior to Visit  Medication Sig Dispense Refill  . aspirin EC 81 MG tablet Take 81 mg by mouth daily as needed (for chest pain).    . colestipol (COLESTID) 1 g tablet Take 1 g by mouth daily.    Marland Kitchen FLUoxetine (PROZAC) 20 MG tablet Take 3 tablets (60 mg total) by mouth daily. 90 tablet 3  . levothyroxine (SYNTHROID, LEVOTHROID) 50 MCG tablet Take 1 tablet (50 mcg total) by mouth daily. 30 tablet 4  . pantoprazole (PROTONIX) 40 MG tablet Take 1 tablet (40 mg total) by mouth daily. 90 tablet 1   No current facility-administered medications on file prior to visit.     Allergies  Allergen Reactions  . Amoxicillin Diarrhea, Nausea And Vomiting and Other (See Comments)    Has patient had a PCN reaction causing immediate rash, facial/tongue/throat swelling, SOB or lightheadedness with hypotension: No Has patient had a PCN reaction causing severe rash involving mucus  membranes or skin necrosis: No Has patient had a PCN reaction that required hospitalization: No Has patient had a PCN reaction occurring within the last 10 years: No If all of the above answers are "NO", then may proceed with Cephalosporin use.     Objective: Physical Exam General: The patient is alert and oriented x3 in no acute distress. Obese.   Dermatology: Skin is warm, dry and supple bilateral lower extremities. Nails 1-10 are normal. There is no erythema, edema, no eccymosis, no open lesions present. Integument is otherwise unremarkable.  Vascular: Dorsalis Pedis pulse and Posterior Tibial pulse are 1/4 bilateral. Capillary fill time is immediate to all digits.  Neurological: Grossly intact to light touch with an achilles reflex of +2/5 and a  negative Tinel's sign bilateral.  Musculoskeletal: Tenderness to palpation at the medial calcaneal tubercale and through the insertion of the plantar fascia on the right foot. No pain with compression of calcaneus bilateral. No pain with tuning fork to calcaneus bilateral. No pain with calf compression bilateral. There is decreased Ankle joint range of motion bilateral. All other joints range of motion within normal limits bilateral. Strength 5/5 in all groups bilateral.   Gait: Unassisted, Antalgic avoid weight on left/right heel  Xray, Right foot: 09-12-18 Normal osseous mineralization. Joint spaces preserved. No fracture/dislocation/boney destruction. Calcaneal spur present with mild thickening of plantar fascia. No other soft tissue abnormalities or radiopaque foreign bodies.   Assessment and Plan: Problem List Items Addressed This Visit      Other   Morbid obesity with BMI of  50.0-59.9, adult [facility])    Other Visit Diagnoses    Plantar fasciitis of right foot    -  Primary   Relevant Medications   methylPREDNISolone (MEDROL DOSEPAK) 4 MG TBPK tablet   triamcinolone acetonide (KENALOG) 10 MG/ML injection 10 mg   Pain of right heel        Relevant Medications   methylPREDNISolone (MEDROL DOSEPAK) 4 MG TBPK tablet      -Complete examination performed.  -Xrays reviewed -Discussed with patient in detail the condition of plantar fasciitis, how this occurs and general treatment options. Explained both conservative and surgical treatments.  -After oral consent and aseptic prep, injected a mixture containing 1 ml of 2%  plain lidocaine, 1 ml 0.5% plain marcaine, 0.5 ml of kenalog 10 and 0.5 ml of dexamethasone phosphate into right heel. Post-injection care discussed with patient.  -Rx  Medrol dose pack  -Recommended good supportive shoes and advised continue with OTC insert. Explained to patient that if these orthoses work well, we will continue with these. If these do not improve her condition and  pain, we will consider custom molded orthoses. -Explained and dispensed to patient daily stretching exercises. -Recommend patient to ice affected area 1-2x daily. -Patient to return to office in 4 weeks for follow up or sooner if problems or questions arise.  Asencion Islam, DPM

## 2018-10-10 NOTE — Patient Instructions (Signed)

## 2018-10-12 ENCOUNTER — Other Ambulatory Visit: Payer: Self-pay | Admitting: Pediatrics

## 2018-10-12 DIAGNOSIS — Z9049 Acquired absence of other specified parts of digestive tract: Principal | ICD-10-CM

## 2018-10-12 DIAGNOSIS — K9189 Other postprocedural complications and disorders of digestive system: Secondary | ICD-10-CM

## 2018-10-12 DIAGNOSIS — R197 Diarrhea, unspecified: Secondary | ICD-10-CM

## 2018-10-18 ENCOUNTER — Telehealth: Payer: Self-pay

## 2018-10-18 MED ORDER — FLUOXETINE HCL 20 MG PO CAPS: 20.0000 mg | ORAL_CAPSULE | Freq: Every day | ORAL | 2 refills | Status: DC

## 2018-10-18 NOTE — Addendum Note (Signed)
Addended byDory Peru on: 10/18/2018 02:42 PM   Modules accepted: Orders

## 2018-10-18 NOTE — Telephone Encounter (Signed)
done

## 2018-10-18 NOTE — Telephone Encounter (Signed)
OK to send in, thanks 

## 2018-10-18 NOTE — Telephone Encounter (Signed)
Medicaid non preferred Fluoxetine tablet  Preferred are Fluoxetine capsules

## 2018-10-26 ENCOUNTER — Other Ambulatory Visit: Payer: Self-pay | Admitting: Pediatrics

## 2018-10-26 DIAGNOSIS — R197 Diarrhea, unspecified: Secondary | ICD-10-CM

## 2018-10-26 DIAGNOSIS — K9189 Other postprocedural complications and disorders of digestive system: Secondary | ICD-10-CM

## 2018-10-26 DIAGNOSIS — Z9049 Acquired absence of other specified parts of digestive tract: Principal | ICD-10-CM

## 2018-11-07 ENCOUNTER — Ambulatory Visit: Payer: Medicaid Other | Admitting: Sports Medicine

## 2018-11-14 ENCOUNTER — Telehealth: Payer: Self-pay | Admitting: *Deleted

## 2018-11-17 NOTE — Telephone Encounter (Signed)
Needs appt for flu shot. 

## 2018-11-20 DIAGNOSIS — G243 Spasmodic torticollis: Secondary | ICD-10-CM | POA: Diagnosis not present

## 2018-11-20 DIAGNOSIS — M9901 Segmental and somatic dysfunction of cervical region: Secondary | ICD-10-CM | POA: Diagnosis not present

## 2018-11-20 DIAGNOSIS — M9902 Segmental and somatic dysfunction of thoracic region: Secondary | ICD-10-CM | POA: Diagnosis not present

## 2018-11-20 DIAGNOSIS — M9900 Segmental and somatic dysfunction of head region: Secondary | ICD-10-CM | POA: Diagnosis not present

## 2018-11-22 DIAGNOSIS — G243 Spasmodic torticollis: Secondary | ICD-10-CM | POA: Diagnosis not present

## 2018-11-22 DIAGNOSIS — M9902 Segmental and somatic dysfunction of thoracic region: Secondary | ICD-10-CM | POA: Diagnosis not present

## 2018-11-22 DIAGNOSIS — M9900 Segmental and somatic dysfunction of head region: Secondary | ICD-10-CM | POA: Diagnosis not present

## 2018-11-22 DIAGNOSIS — M9901 Segmental and somatic dysfunction of cervical region: Secondary | ICD-10-CM | POA: Diagnosis not present

## 2018-11-23 DIAGNOSIS — M9902 Segmental and somatic dysfunction of thoracic region: Secondary | ICD-10-CM | POA: Diagnosis not present

## 2018-11-23 DIAGNOSIS — M9900 Segmental and somatic dysfunction of head region: Secondary | ICD-10-CM | POA: Diagnosis not present

## 2018-11-23 DIAGNOSIS — M9901 Segmental and somatic dysfunction of cervical region: Secondary | ICD-10-CM | POA: Diagnosis not present

## 2018-11-23 DIAGNOSIS — G243 Spasmodic torticollis: Secondary | ICD-10-CM | POA: Diagnosis not present

## 2018-11-27 DIAGNOSIS — M9902 Segmental and somatic dysfunction of thoracic region: Secondary | ICD-10-CM | POA: Diagnosis not present

## 2018-11-27 DIAGNOSIS — M9901 Segmental and somatic dysfunction of cervical region: Secondary | ICD-10-CM | POA: Diagnosis not present

## 2018-11-27 DIAGNOSIS — M9900 Segmental and somatic dysfunction of head region: Secondary | ICD-10-CM | POA: Diagnosis not present

## 2018-11-27 DIAGNOSIS — G243 Spasmodic torticollis: Secondary | ICD-10-CM | POA: Diagnosis not present

## 2018-11-28 ENCOUNTER — Ambulatory Visit: Payer: Medicaid Other | Admitting: Sports Medicine

## 2018-11-30 ENCOUNTER — Ambulatory Visit: Payer: Medicaid Other | Admitting: Pediatrics

## 2018-12-05 ENCOUNTER — Encounter: Payer: Self-pay | Admitting: Pediatrics

## 2018-12-11 ENCOUNTER — Ambulatory Visit (INDEPENDENT_AMBULATORY_CARE_PROVIDER_SITE_OTHER): Payer: Medicaid Other | Admitting: Otolaryngology

## 2018-12-11 DIAGNOSIS — H95123 Granulation of postmastoidectomy cavity, bilateral ears: Secondary | ICD-10-CM | POA: Diagnosis not present

## 2018-12-25 ENCOUNTER — Other Ambulatory Visit: Payer: Self-pay | Admitting: Pediatrics

## 2018-12-25 DIAGNOSIS — R197 Diarrhea, unspecified: Secondary | ICD-10-CM

## 2018-12-25 DIAGNOSIS — Z9049 Acquired absence of other specified parts of digestive tract: Principal | ICD-10-CM

## 2019-01-15 ENCOUNTER — Ambulatory Visit: Payer: Medicaid Other | Admitting: Pediatrics

## 2019-01-16 ENCOUNTER — Encounter: Payer: Self-pay | Admitting: Family Medicine

## 2019-01-16 ENCOUNTER — Ambulatory Visit (INDEPENDENT_AMBULATORY_CARE_PROVIDER_SITE_OTHER): Payer: Medicaid Other | Admitting: Family Medicine

## 2019-01-16 VITALS — BP 128/88 | HR 77 | Temp 97.7°F | Ht 65.0 in | Wt 346.0 lb

## 2019-01-16 DIAGNOSIS — M25532 Pain in left wrist: Secondary | ICD-10-CM | POA: Diagnosis not present

## 2019-01-16 DIAGNOSIS — I10 Essential (primary) hypertension: Secondary | ICD-10-CM

## 2019-01-16 DIAGNOSIS — D508 Other iron deficiency anemias: Secondary | ICD-10-CM

## 2019-01-16 DIAGNOSIS — F411 Generalized anxiety disorder: Secondary | ICD-10-CM

## 2019-01-16 DIAGNOSIS — Z23 Encounter for immunization: Secondary | ICD-10-CM

## 2019-01-16 DIAGNOSIS — E039 Hypothyroidism, unspecified: Secondary | ICD-10-CM | POA: Diagnosis not present

## 2019-01-16 HISTORY — DX: Morbid (severe) obesity due to excess calories: E66.01

## 2019-01-16 NOTE — Progress Notes (Signed)
Subjective: CC: Hypothyroidism/ Panic PCP: Johna Sheriff, MD Amber Colon is a 38 y.o. female presenting to clinic today for:  1. Hypothyroidism Diagnosed after abnormal TSH in 2015.  Denies any history of surgery or radiation to the neck.  Denies any constipation, diarrhea, change in voice or difficulty swallowing.  No tremor.  No increased anxiety or insomnia.  She does report persistently low energy and weight gain, particularly since being transitioned from 75 mcg of Synthroid to 50.  She notes that when she was on the 75 mcg daily she was becoming very sweaty and subsequently became dehydrated.  She has since then gained about 30 pounds.  She reports compliance with Synthroid and takes this appropriately.  Family history significant for thyroid disorder in her maternal aunt.  2. Anxiety/panic Patient is no longer on Prozac but states that she is doing well from the standpoint.  3.  Morbid obesity Patient is very interested in having lap band surgery and wishes to have a referral for this.  She has had fluctuations in weight, seemingly related to thyroid but maintained morbid obesity despite adequate treatment.  4.  Left wrist pain Patient reports left wrist and hand pain that she describes as intense aching and stiffness of the fingers when she wakes up in the morning x1 month.  She denies any sensation changes.  Symptoms last about 15 minutes and then resolved.  She is left-hand dominant.  Again no family history of autoimmune disease and no personal history of autoimmune disease that she knows of.  She takes Motrin every night at bedtime.   ROS: Per HPI  Allergies  Allergen Reactions  . Amoxicillin Diarrhea, Nausea And Vomiting and Other (See Comments)    Has patient had a PCN reaction causing immediate rash, facial/tongue/throat swelling, SOB or lightheadedness with hypotension: No Has patient had a PCN reaction causing severe rash involving mucus membranes or skin  necrosis: No Has patient had a PCN reaction that required hospitalization: No Has patient had a PCN reaction occurring within the last 10 years: No If all of the above answers are "NO", then may proceed with Cephalosporin use.    Past Medical History:  Diagnosis Date  . Anemia    not taking her iron pills either  . Anxiety   . Depression   . Gallstones   . GERD (gastroesophageal reflux disease)   . Headache    "occasional"  . History of blood transfusion    blood after miscarrige  . Hypertension    doesn't take her bp pills  . Hypothyroidism    again, takes no med  . Obesity   . Panic attack     Current Outpatient Medications:  .  aspirin EC 81 MG tablet, Take 81 mg by mouth daily as needed (for chest pain)., Disp: , Rfl:  .  colestipol (COLESTID) 1 g tablet, TAKE 2 TABLETS BY MOUTH TWICE DAILY, Disp: 120 tablet, Rfl: 0 .  FLUoxetine (PROZAC) 20 MG capsule, Take 1 capsule (20 mg total) by mouth daily., Disp: 30 capsule, Rfl: 2 .  levothyroxine (SYNTHROID, LEVOTHROID) 50 MCG tablet, Take 1 tablet (50 mcg total) by mouth daily., Disp: 30 tablet, Rfl: 4 .  methylPREDNISolone (MEDROL DOSEPAK) 4 MG TBPK tablet, Take as directed, Disp: 21 tablet, Rfl: 0 .  pantoprazole (PROTONIX) 40 MG tablet, Take 1 tablet (40 mg total) by mouth daily., Disp: 90 tablet, Rfl: 1  Current Facility-Administered Medications:  .  triamcinolone acetonide (KENALOG) 10 MG/ML injection  10 mg, 10 mg, Other, Once, Asencion Islam, DPM Social History   Socioeconomic History  . Marital status: Married    Spouse name: Not on file  . Number of children: 3  . Years of education: Not on file  . Highest education level: Not on file  Occupational History  . Occupation: stay at home mom  Social Needs  . Financial resource strain: Not on file  . Food insecurity:    Worry: Not on file    Inability: Not on file  . Transportation needs:    Medical: Not on file    Non-medical: Not on file  Tobacco Use  .  Smoking status: Current Some Day Smoker    Packs/day: 0.25    Years: 2.00    Pack years: 0.50    Types: Cigarettes  . Smokeless tobacco: Never Used  . Tobacco comment: 3-4 cigs a day  Substance and Sexual Activity  . Alcohol use: No  . Drug use: No  . Sexual activity: Not on file    Comment: only smokes about 2 cigrettes a day/trying to quit  Lifestyle  . Physical activity:    Days per week: Not on file    Minutes per session: Not on file  . Stress: Not on file  Relationships  . Social connections:    Talks on phone: Not on file    Gets together: Not on file    Attends religious service: Not on file    Active member of club or organization: Not on file    Attends meetings of clubs or organizations: Not on file    Relationship status: Not on file  . Intimate partner violence:    Fear of current or ex partner: Not on file    Emotionally abused: Not on file    Physically abused: Not on file    Forced sexual activity: Not on file  Other Topics Concern  . Not on file  Social History Narrative  . Not on file   Family History  Problem Relation Age of Onset  . Diabetes Mother   . Heart disease Mother   . Ovarian cancer Mother   . Stroke Mother   . Hypertension Mother   . Diabetes Father   . COPD Father   . Heart disease Father   . Anuerysm Father        Neck, stomach  . Breast cancer Maternal Grandmother   . Diabetes Maternal Grandfather   . Diabetes Paternal Aunt   . Diabetes Paternal Uncle     Objective: Office vital signs reviewed. BP 128/88   Pulse 77   Temp 97.7 F (36.5 C) (Oral)   Ht 5\' 5"  (1.651 m)   Wt (!) 346 lb (156.9 kg)   BMI 57.58 kg/m   Physical Examination:  General: Awake, alert, morbidly obese, No acute distress HEENT: Normal    Neck: No masses palpated. No lymphadenopathy; no goiter or thyromegaly    Eyes: PERRLA, extraocular membranes intact, sclera white.  No exophthalmos Cardio: regular rate and rhythm, S1S2 heard, no murmurs  appreciated Pulm: clear to auscultation bilaterally, no wheezes, rhonchi or rales; normal work of breathing on room air Extremities: warm, well perfused, No edema, cyanosis or clubbing; +2 pulses bilaterally MSK: Left hand and wrist without evidence of joint swelling or discoloration.  She has full active range of motion. Neuro: no tremor Psych: Mood stable, affect appropriate, pleasant Depression screen [facility] 2/9 01/16/2019 09/12/2018 07/12/2018  Decreased Interest 1 0 1  Down, Depressed, Hopeless 0 0 1  PHQ - 2 Score 1 0 2  Altered sleeping 0 - 1  Tired, decreased energy 1 - 1  Change in appetite 1 - 1  Feeling bad or failure about yourself  0 - 0  Trouble concentrating 0 - 0  Moving slowly or fidgety/restless 0 - 0  Suicidal thoughts 0 - 0  PHQ-9 Score 3 - 5  Difficult doing work/chores Not difficult at all - Somewhat difficult  Some recent data might be hidden    Assessment/ Plan: 38 y.o. female   1. Hypothyroidism, unspecified type Has had significant weight gain since last year, seemingly related to decrease in thyroid dose.  Check thyroid panel. - Thyroid Panel With TSH  2. GAD (generalized anxiety disorder) Controlled without medication at this time per her report. - Thyroid Panel With TSH - Basic Metabolic Panel  3. Other iron deficiency anemia Fatigue noted.  Check CBC - CBC  4. Essential hypertension Under control. - Basic Metabolic Panel - Ambulatory referral to General Surgery  5. Left wrist pain Likely overuse versus osteoarthritis.  Recommended adding Tylenol to ibuprofen.  Can consider referral to orthopedics for joint injection if she desires in the future  6. Morbid obesity (HCC) Referral placed to bariatric surgery.  She is interested in lapband. - Ambulatory referral to General Surgery   Orders Placed This Encounter  Procedures  . Thyroid Panel With TSH  . CBC  . Basic Metabolic Panel  . Ambulatory referral to General Surgery    Referral  Priority:   Routine    Referral Type:   Surgical    Referral Reason:   Specialty Services Required    Requested Specialty:   General Surgery    Number of Visits Requested:   1   No orders of the defined types were placed in this encounter.    Raliegh IpAshly M Garik Diamant, DO [facility] [phone]

## 2019-01-16 NOTE — Patient Instructions (Signed)
You had labs performed today.  You will be contacted with the results of the labs once they are available, usually in the next 3 business days for routine lab work.   Start taking Tylenol with your ibuprofen for the hand/ wrist.

## 2019-01-17 ENCOUNTER — Other Ambulatory Visit: Payer: Self-pay | Admitting: Family Medicine

## 2019-01-17 DIAGNOSIS — E039 Hypothyroidism, unspecified: Secondary | ICD-10-CM

## 2019-01-17 LAB — BASIC METABOLIC PANEL
BUN / CREAT RATIO: 16 (ref 9–23)
BUN: 17 mg/dL (ref 6–20)
CO2: 21 mmol/L (ref 20–29)
CREATININE: 1.08 mg/dL — AB (ref 0.57–1.00)
Calcium: 9.5 mg/dL (ref 8.7–10.2)
Chloride: 103 mmol/L (ref 96–106)
GFR calc non Af Amer: 66 mL/min/{1.73_m2} (ref >59)
GFR, EST AFRICAN AMERICAN: 76 mL/min/{1.73_m2} (ref >59)
Glucose: 100 mg/dL — ABNORMAL HIGH (ref 65–99)
Potassium: 4.6 mmol/L (ref 3.5–5.2)
Sodium: 138 mmol/L (ref 134–144)

## 2019-01-17 LAB — THYROID PANEL WITH TSH
Free Thyroxine Index: 1.8 (ref 1.2–4.9)
T3 Uptake Ratio: 25 % (ref 24–39)
T4 TOTAL: 7.2 ug/dL (ref 4.5–12.0)
TSH: 5.04 u[IU]/mL — ABNORMAL HIGH (ref 0.450–4.500)

## 2019-01-17 LAB — CBC
HEMATOCRIT: 33.1 % — AB (ref 34.0–46.6)
HEMOGLOBIN: 10.1 g/dL — AB (ref 11.1–15.9)
MCH: 22.5 pg — AB (ref 26.6–33.0)
MCHC: 30.5 g/dL — ABNORMAL LOW (ref 31.5–35.7)
MCV: 74 fL — ABNORMAL LOW (ref 79–97)
Platelets: 231 10*3/uL (ref 150–450)
RBC: 4.48 x10E6/uL (ref 3.77–5.28)
RDW: 17.2 % — ABNORMAL HIGH (ref 11.7–15.4)
WBC: 7.1 10*3/uL (ref 3.4–10.8)

## 2019-01-17 MED ORDER — LEVOTHYROXINE SODIUM 50 MCG PO TABS: 50.0000 ug | ORAL_TABLET | Freq: Every day | ORAL | 0 refills | Status: DC

## 2019-01-17 NOTE — Progress Notes (Signed)
Aware of dose change for thyroid.

## 2019-01-17 NOTE — Progress Notes (Signed)
Please inform patient of dose change.  She will continue 1 tablet during the week and take 1.5 tablets on Sunday.  We will recheck in 2 months

## 2019-03-10 ENCOUNTER — Other Ambulatory Visit: Payer: Self-pay | Admitting: Pediatrics

## 2019-03-10 DIAGNOSIS — Z9049 Acquired absence of other specified parts of digestive tract: Principal | ICD-10-CM

## 2019-03-10 DIAGNOSIS — R197 Diarrhea, unspecified: Secondary | ICD-10-CM

## 2019-03-25 ENCOUNTER — Other Ambulatory Visit: Payer: Self-pay | Admitting: Pediatrics

## 2019-03-25 DIAGNOSIS — K219 Gastro-esophageal reflux disease without esophagitis: Secondary | ICD-10-CM

## 2019-04-02 ENCOUNTER — Other Ambulatory Visit: Payer: Self-pay | Admitting: Pediatrics

## 2019-04-02 DIAGNOSIS — K219 Gastro-esophageal reflux disease without esophagitis: Secondary | ICD-10-CM

## 2019-04-23 ENCOUNTER — Ambulatory Visit: Payer: Medicaid Other | Admitting: Family Medicine

## 2019-04-23 ENCOUNTER — Ambulatory Visit (INDEPENDENT_AMBULATORY_CARE_PROVIDER_SITE_OTHER): Payer: Medicaid Other | Admitting: Family Medicine

## 2019-04-23 ENCOUNTER — Other Ambulatory Visit: Payer: Self-pay

## 2019-04-23 ENCOUNTER — Encounter: Payer: Self-pay | Admitting: Family Medicine

## 2019-04-23 VITALS — BP 122/86 | HR 105 | Temp 97.5°F | Ht 65.0 in | Wt 366.6 lb

## 2019-04-23 DIAGNOSIS — M5442 Lumbago with sciatica, left side: Secondary | ICD-10-CM

## 2019-04-23 DIAGNOSIS — Z6841 Body Mass Index (BMI) 40.0 and over, adult: Secondary | ICD-10-CM

## 2019-04-23 DIAGNOSIS — Z635 Disruption of family by separation and divorce: Secondary | ICD-10-CM | POA: Diagnosis not present

## 2019-04-23 DIAGNOSIS — E039 Hypothyroidism, unspecified: Secondary | ICD-10-CM | POA: Diagnosis not present

## 2019-04-23 DIAGNOSIS — R7989 Other specified abnormal findings of blood chemistry: Secondary | ICD-10-CM | POA: Diagnosis not present

## 2019-04-23 MED ORDER — DICLOFENAC SODIUM 1 % TD GEL: 4.0000 g | Freq: Four times a day (QID) | TRANSDERMAL | 0 refills | Status: DC

## 2019-04-23 NOTE — Patient Instructions (Signed)
Continue Tylenol.  I added a topical gel for pain.  I have given you home physical therapy. Call Medicaid to see what surgeon your insurance covers.  In preparation for our upcoming appointment regarding weight loss, I would like you to answer the following questions on a separate piece of paper and bring them with you to your appointment.   Why do you want to lose weight?  Does obesity run in your family?  What has been your highest weight/ lowest weight in the past?  What did you weigh when you were 38 years old?  What is your goal weight?  What diets/ medications have you tried and did they work?  What side effects did they have if any?  What are your "eating/ binging" triggers?  Have you ever seen a therapist regarding your eating habits?  Are you willing to?  Would you be willing to see a dietician?  Are you willing to meet with me monthly?  Do you have any personal history of thyroid disease (including thyroid cancer), pancreatitis, diabetes, high blood pressure, high cholesterol, heart attack, stroke?  Do you have any family history of thyroid disease (including thyroid cancer), pancreatitis, diabetes, high blood pressure, high cholesterol, heart attack, stroke?  If so, please list who.  Have you ever been diagnosed or had a mental health disorder (addiction, depression, anxiety, bipolar disorder, schizophrenia, etc)?  Is there family history of mental health disorders (addiction, depression, anxiety, bipolar disorder, schizophrenia, etc)?  Would you be interested in bariatric surgery if you were determined to be a candidate?  Please also bring a 3 day food journal to that appointment.  Document EVERYTHING (including snacks/ candies/ food tastings/ drinks), what time of day you ate them and what you were doing and feeling when you ate/ drink (were you driving/ rushing to get somewhere?  Were you seated at the dinner table/ watching tv?  Were you lonely/ upset/ happy/  celebrating?)  Calorie Counting for Weight Loss Calories are units of energy. Your body needs a certain amount of calories from food to keep you going throughout the day. When you eat more calories than your body needs, your body stores the extra calories as fat. When you eat fewer calories than your body needs, your body burns fat to get the energy it needs. Calorie counting means keeping track of how many calories you eat and drink each day. Calorie counting can be helpful if you need to lose weight. If you make sure to eat fewer calories than your body needs, you should lose weight. Ask your health care provider what a healthy weight is for you. For calorie counting to work, you will need to eat the right number of calories in a day in order to lose a healthy amount of weight per week. A dietitian can help you determine how many calories you need in a day and will give you suggestions on how to reach your calorie goal.  A healthy amount of weight to lose per week is usually 1-2 lb (0.5-0.9 kg). This usually means that your daily calorie intake should be reduced by 500-750 calories.  Eating 1,200 - 1,500 calories per day can help most women lose weight.  Eating 1,500 - 1,800 calories per day can help most men lose weight. What is my plan? My goal is to have __________ calories per day. If I have this many calories per day, I should lose around __________ pounds per week. What do I need to know about  calorie counting? In order to meet your daily calorie goal, you will need to:  Find out how many calories are in each food you would like to eat. Try to do this before you eat.  Decide how much of the food you plan to eat.  Write down what you ate and how many calories it had. Doing this is called keeping a food log. To successfully lose weight, it is important to balance calorie counting with a healthy lifestyle that includes regular activity. Aim for 150 minutes of moderate exercise (such as  walking) or 75 minutes of vigorous exercise (such as running) each week. Where do I find calorie information?  The number of calories in a food can be found on a Nutrition Facts label. If a food does not have a Nutrition Facts label, try to look up the calories online or ask your dietitian for help. Remember that calories are listed per serving. If you choose to have more than one serving of a food, you will have to multiply the calories per serving by the amount of servings you plan to eat. For example, the label on a package of bread might say that a serving size is 1 slice and that there are 90 calories in a serving. If you eat 1 slice, you will have eaten 90 calories. If you eat 2 slices, you will have eaten 180 calories. How do I keep a food log? Immediately after each meal, record the following information in your food log:  What you ate. Don't forget to include toppings, sauces, and other extras on the food.  How much you ate. This can be measured in cups, ounces, or number of items.  How many calories each food and drink had.  The total number of calories in the meal. Keep your food log near you, such as in a small notebook in your pocket, or use a mobile app or website. Some programs will calculate calories for you and show you how many calories you have left for the day to meet your goal. What are some calorie counting tips?   Use your calories on foods and drinks that will fill you up and not leave you hungry: ? Some examples of foods that fill you up are nuts and nut butters, vegetables, lean proteins, and high-fiber foods like whole grains. High-fiber foods are foods with more than 5 g fiber per serving. ? Drinks such as sodas, specialty coffee drinks, alcohol, and juices have a lot of calories, yet do not fill you up.  Eat nutritious foods and avoid empty calories. Empty calories are calories you get from foods or beverages that do not have many vitamins or protein, such as  candy, sweets, and soda. It is better to have a nutritious high-calorie food (such as an avocado) than a food with few nutrients (such as a bag of chips).  Know how many calories are in the foods you eat most often. This will help you calculate calorie counts faster.  Pay attention to calories in drinks. Low-calorie drinks include water and unsweetened drinks.  Pay attention to nutrition labels for "low fat" or "fat free" foods. These foods sometimes have the same amount of calories or more calories than the full fat versions. They also often have added sugar, starch, or salt, to make up for flavor that was removed with the fat.  Find a way of tracking calories that works for you. Get creative. Try different apps or programs if writing down calories  does not work for you. What are some portion control tips?  Know how many calories are in a serving. This will help you know how many servings of a certain food you can have.  Use a measuring cup to measure serving sizes. You could also try weighing out portions on a kitchen scale. With time, you will be able to estimate serving sizes for some foods.  Take some time to put servings of different foods on your favorite plates, bowls, and cups so you know what a serving looks like.  Try not to eat straight from a bag or box. Doing this can lead to overeating. Put the amount you would like to eat in a cup or on a plate to make sure you are eating the right portion.  Use smaller plates, glasses, and bowls to prevent overeating.  Try not to multitask (for example, watch TV or use your computer) while eating. If it is time to eat, sit down at a table and enjoy your food. This will help you to know when you are full. It will also help you to be aware of what you are eating and how much you are eating. What are tips for following this plan? Reading food labels  Check the calorie count compared to the serving size. The serving size may be smaller than what  you are used to eating.  Check the source of the calories. Make sure the food you are eating is high in vitamins and protein and low in saturated and trans fats. Shopping  Read nutrition labels while you shop. This will help you make healthy decisions before you decide to purchase your food.  Make a grocery list and stick to it. Cooking  Try to cook your favorite foods in a healthier way. For example, try baking instead of frying.  Use low-fat dairy products. Meal planning  Use more fruits and vegetables. Half of your plate should be fruits and vegetables.  Include lean proteins like poultry and fish. How do I count calories when eating out?  Ask for smaller portion sizes.  Consider sharing an entree and sides instead of getting your own entree.  If you get your own entree, eat only half. Ask for a box at the beginning of your meal and put the rest of your entree in it so you are not tempted to eat it.  If calories are listed on the menu, choose the lower calorie options.  Choose dishes that include vegetables, fruits, whole grains, low-fat dairy products, and lean protein.  Choose items that are boiled, broiled, grilled, or steamed. Stay away from items that are buttered, battered, fried, or served with cream sauce. Items labeled "crispy" are usually fried, unless stated otherwise.  Choose water, low-fat milk, unsweetened iced tea, or other drinks without added sugar. If you want an alcoholic beverage, choose a lower calorie option such as a glass of wine or light beer.  Ask for dressings, sauces, and syrups on the side. These are usually high in calories, so you should limit the amount you eat.  If you want a salad, choose a garden salad and ask for grilled meats. Avoid extra toppings like bacon, cheese, or fried items. Ask for the dressing on the side, or ask for olive oil and vinegar or lemon to use as dressing.  Estimate how many servings of a food you are given. For  example, a serving of cooked rice is  cup or about the size of half a  baseball. Knowing serving sizes will help you be aware of how much food you are eating at restaurants. The list below tells you how big or small some common portion sizes are based on everyday objects: ? 1 oz--4 stacked dice. ? 3 oz--1 deck of cards. ? 1 tsp--1 die. ? 1 Tbsp-- a ping-pong ball. ? 2 Tbsp--1 ping-pong ball. ?  cup-- baseball. ? 1 cup--1 baseball. Summary  Calorie counting means keeping track of how many calories you eat and drink each day. If you eat fewer calories than your body needs, you should lose weight.  A healthy amount of weight to lose per week is usually 1-2 lb (0.5-0.9 kg). This usually means reducing your daily calorie intake by 500-750 calories.  The number of calories in a food can be found on a Nutrition Facts label. If a food does not have a Nutrition Facts label, try to look up the calories online or ask your dietitian for help.  Use your calories on foods and drinks that will fill you up, and not on foods and drinks that will leave you hungry.  Use smaller plates, glasses, and bowls to prevent overeating. This information is not intended to replace advice given to you by your health care provider. Make sure you discuss any questions you have with your health care provider. Document Released: 12/06/2005 Document Revised: 08/25/2018 Document Reviewed: 11/05/2016 Elsevier Interactive Patient Education  2019 [employer].

## 2019-04-23 NOTE — Progress Notes (Signed)
Subjective: CC: Hypothyroidism/ Panic PCP: Amber Ip, DO Amber Colon is a 38 y.o. female presenting to clinic today for:  1. Hypothyroidism/low back pain Diagnosed after abnormal TSH in 2015.  No history of surgery or radiation to the neck.   Family history significant for thyroid disorder in her maternal aunt.  Patient reports compliance with Synthroid 50 mcg daily except for Sundays when she takes 75 mcg daily.  She continues to gain weight and have poor energy.  She has been trying to walk in efforts to lose weight but states that her low back has been hurting her with radiation down to the left lower extremity.  She denies any constipation, diarrhea, change in voice or difficulty swallowing.  No tremor.  No falls.  She does report some numbness and tingling in the left lower extremity that is intermittent.  No saddle anesthesia, fecal incontinence or urinary retention.  She has not been taking any oral NSAIDs secondary to recent bump in kidney function.  She has been taking Tylenol but does not feel that this is helping much.  She did start a course in efforts to see the bariatric surgeon but was later told that her insurance did not pay for that surgeon.  She would like to get bariatric surgery done but does not know who her insurance will cover.  2.  Stress at home Patient reports some increased stress and stress eating related to her husband who recently returned from [place] in her separating.  She does not think that she wants to go back on her SSRI yet.  ROS: Per HPI  Allergies  Allergen Reactions   Amoxicillin Diarrhea, Nausea And Vomiting and Other (See Comments)    Has patient had a PCN reaction causing immediate rash, facial/tongue/throat swelling, SOB or lightheadedness with hypotension: No Has patient had a PCN reaction causing severe rash involving mucus membranes or skin necrosis: No Has patient had a PCN reaction that required hospitalization: No Has  patient had a PCN reaction occurring within the last 10 years: No If all of the above answers are "NO", then may proceed with Cephalosporin use.    Past Medical History:  Diagnosis Date   Anemia    not taking her iron pills either   Anxiety    Depression    Gallstones    GERD (gastroesophageal reflux disease)    Headache    "occasional"   History of blood transfusion    blood after miscarrige   Hypertension    doesn't take her bp pills   Hypothyroidism    again, takes no med   Obesity    Panic attack     Current Outpatient Medications:    aspirin EC 81 MG tablet, Take 81 mg by mouth daily as needed (for chest pain)., Disp: , Rfl:    colestipol (COLESTID) 1 g tablet, Take 2 tablets by mouth twice daily, Disp: 120 tablet, Rfl: 3   levothyroxine (SYNTHROID, LEVOTHROID) 50 MCG tablet, Take 1 tablet (50 mcg total) by mouth daily. (Monday through Saturday). Take 1.5 tablets ( ) on Sunday, Disp: 90 tablet, Rfl: 0   pantoprazole (PROTONIX) 40 MG tablet, Take 1 tablet by mouth once daily, Disp: 90 tablet, Rfl: 0 Social History   Socioeconomic History   Marital status: Married    Spouse name: Not on file   Number of children: 3   Years of education: Not on file   Highest education level: Not on file  Occupational History  Occupation: stay at home mom  Social Needs   Financial resource strain: Not on file   Food insecurity:    Worry: Not on file    Inability: Not on file   Transportation needs:    Medical: Not on file    Non-medical: Not on file  Tobacco Use   Smoking status: Current Some Day Smoker    Packs/day: 0.25    Years: 2.00    Pack years: 0.50    Types: Cigarettes   Smokeless tobacco: Never Used   Tobacco comment: 3-4 cigs a day  Substance and Sexual Activity   Alcohol use: No   Drug use: No   Sexual activity: Not on file    Comment: only smokes about 2 cigrettes a day/trying to quit  Lifestyle   Physical activity:    Days  per week: Not on file    Minutes per session: Not on file   Stress: Not on file  Relationships   Social connections:    Talks on phone: Not on file    Gets together: Not on file    Attends religious service: Not on file    Active member of club or organization: Not on file    Attends meetings of clubs or organizations: Not on file    Relationship status: Not on file   Intimate partner violence:    Fear of current or ex partner: Not on file    Emotionally abused: Not on file    Physically abused: Not on file    Forced sexual activity: Not on file  Other Topics Concern   Not on file  Social History Narrative   Not on file   Family History  Problem Relation Age of Onset   Diabetes Mother    Heart disease Mother    Ovarian cancer Mother    Stroke Mother    Hypertension Mother    Diabetes Father    COPD Father    Heart disease Father    Anuerysm Father        Neck, stomach   Breast cancer Maternal Grandmother    Diabetes Maternal Grandfather    Diabetes Paternal Aunt    Diabetes Paternal Uncle     Objective: Office vital signs reviewed. BP (!) 147/107    Pulse 98    Temp (!) 97.5 F (36.4 C) (Oral)    Ht 5\' 5"  (1.651 m)    Wt (!) 366 lb 9.6 oz (166.3 kg)    LMP 04/23/2019    BMI 61.01 kg/m   Physical Examination:  General: Awake, alert, morbidly obese, No acute distress HEENT: Normal    Neck: No masses palpated. No lymphadenopathy; no goiter or thyromegaly    Eyes: extraocular membranes intact, sclera white.  No exophthalmos Cardio: regular rate and rhythm, S1S2 heard, no murmurs appreciated Pulm: clear to auscultation bilaterally, no wheezes, rhonchi or rales; normal work of breathing on room air Extremities: warm, well perfused, No edema, cyanosis or clubbing; +2 pulses bilaterally MSK:   Lumbar spine: Active range of motion preserved.  She has no midline tenderness to palpation.  No bony abnormalities palpable.  She has mild paraspinal muscle  tenderness to palpation bilaterally. Skin: Normal temperature. Neuro: no tremor Psych: Mood stable, affect appropriate, pleasant Depression screen [facility] 2/9 04/23/2019 01/16/2019 09/12/2018  Decreased Interest 0 1 0  Down, Depressed, Hopeless 0 0 0  PHQ - 2 Score 0 1 0  Altered sleeping - 0 -  Tired, decreased energy - 1 -  Change in appetite - 1 -  Feeling bad or failure about yourself  - 0 -  Trouble concentrating - 0 -  Moving slowly or fidgety/restless - 0 -  Suicidal thoughts - 0 -  PHQ-9 Score - 3 -  Difficult doing work/chores - Not difficult at all -  Some recent data might be hidden    Assessment/ Plan: 38 y.o. female   1. Acquired hypothyroidism Continues to be symptomatic.  Check thyroid panel.  We will plan to increase Synthroid to 75 mcg daily if persistently abnormal.  I think that this patient's goal TSH should be around 3. - Thyroid Panel With TSH  2. Morbid obesity with BMI of 50.0-59.9, adult [facility](HCC) She will contact Medicaid to see what bariatric surgeon is covered.  I do think this patient would be an excellent candidate for bariatric surgery.  3. Acute bilateral low back pain with left-sided sciatica We discussed continuing Tylenol.  Home physical therapy recommended with stretching.  I have recommended avoiding NSAIDs for now while we await her metabolic panel to return.  Voltaren gel was prescribed for arthralgias.  4. Separated from spouse Does not want medication at this time.  May need to consider counseling if she has ongoing stress and use of food as an emotional escape.  5. Elevated serum creatinine - Basic Metabolic Panel   No orders of the defined types were placed in this encounter.  No orders of the defined types were placed in this encounter.    Amber IpAshly M Keavon Sensing, DO [facility] [phone]

## 2019-04-25 ENCOUNTER — Other Ambulatory Visit: Payer: Self-pay | Admitting: Family Medicine

## 2019-04-25 LAB — BASIC METABOLIC PANEL
BUN/Creatinine Ratio: 14 (ref 9–23)
BUN: 13 mg/dL (ref 6–20)
CO2: 20 mmol/L (ref 20–29)
Calcium: 9.7 mg/dL (ref 8.7–10.2)
Chloride: 103 mmol/L (ref 96–106)
Creatinine, Ser: 0.96 mg/dL (ref 0.57–1.00)
GFR calc Af Amer: 87 mL/min/{1.73_m2} (ref >59)
GFR calc non Af Amer: 76 mL/min/{1.73_m2} (ref >59)
Glucose: 99 mg/dL (ref 65–99)
Potassium: 4.3 mmol/L (ref 3.5–5.2)
Sodium: 140 mmol/L (ref 134–144)

## 2019-04-25 LAB — THYROID PANEL WITH TSH
Free Thyroxine Index: 1.8 (ref 1.2–4.9)
T3 Uptake Ratio: 25 % (ref 24–39)
T4, Total: 7.3 ug/dL (ref 4.5–12.0)
TSH: 5.49 u[IU]/mL — ABNORMAL HIGH (ref 0.450–4.500)

## 2019-04-25 MED ORDER — LEVOTHYROXINE SODIUM 75 MCG PO TABS: 75.0000 ug | ORAL_TABLET | Freq: Every day | ORAL | 0 refills | Status: DC

## 2019-04-26 ENCOUNTER — Telehealth: Payer: Self-pay | Admitting: Family Medicine

## 2019-04-26 ENCOUNTER — Other Ambulatory Visit: Payer: Self-pay | Admitting: Family Medicine

## 2019-04-26 MED ORDER — TIZANIDINE HCL 4 MG PO TABS: 2.0000 mg | ORAL_TABLET | Freq: Three times a day (TID) | ORAL | 1 refills | Status: DC | PRN

## 2019-04-26 NOTE — Telephone Encounter (Signed)
Tizanidine sent to pharmacy.  Continue tylenol.  Use muscle relaxer with caution as it can cause sleepiness.  Stretching/ OTC muscle rubs/ Heat recommended.

## 2019-04-26 NOTE — Telephone Encounter (Signed)
Patient aware and verbalizes understanding. 

## 2019-04-26 NOTE — Telephone Encounter (Signed)
Pt called again stated that insurance will not cover gel rx and will something else can be called in

## 2019-05-04 ENCOUNTER — Other Ambulatory Visit: Payer: Self-pay | Admitting: Family Medicine

## 2019-05-04 ENCOUNTER — Telehealth: Payer: Self-pay | Admitting: Family Medicine

## 2019-05-04 MED ORDER — FLUOXETINE HCL 20 MG PO CAPS: 20.0000 mg | ORAL_CAPSULE | Freq: Every day | ORAL | 1 refills | Status: DC

## 2019-05-04 NOTE — Telephone Encounter (Signed)
Since this was addressed at our last visit a couple of days ago I will start her back on fluoxetine 20 mg daily.  Please inform her that we should follow-up in about 6 weeks to recheck her symptoms.

## 2019-05-04 NOTE — Telephone Encounter (Signed)
Pt aware of MD feedback and states she will call back to schedule appt.

## 2019-05-07 ENCOUNTER — Ambulatory Visit (INDEPENDENT_AMBULATORY_CARE_PROVIDER_SITE_OTHER): Payer: Medicaid Other | Admitting: Family Medicine

## 2019-05-07 ENCOUNTER — Other Ambulatory Visit: Payer: Self-pay

## 2019-05-07 DIAGNOSIS — F411 Generalized anxiety disorder: Secondary | ICD-10-CM

## 2019-05-07 DIAGNOSIS — F330 Major depressive disorder, recurrent, mild: Secondary | ICD-10-CM | POA: Diagnosis not present

## 2019-05-07 NOTE — Progress Notes (Signed)
Telephone visit  Subjective: CC: f/u depression PCP: Raliegh Ip, DO BZJ:IRCVELF Amber Colon is a 38 y.o. female calls for telephone consult today. Patient provides verbal consent for consult held via phone.  Location of patient: home Location of provider: [facility] Others present for call: none  1. Depression Patient reports that she has been feeling down and anxious because of what is going on with husband.  They are currently back together.  She doesn't know if this is a good thing or not.  She reports wanting to sleep through everything. She doesn't want to leave her house.  She has good support from her mother.  No SI/HI.   ROS: Per HPI  Allergies  Allergen Reactions  . Amoxicillin Diarrhea, Nausea And Vomiting and Other (See Comments)    Has patient had a PCN reaction causing immediate rash, facial/tongue/throat swelling, SOB or lightheadedness with hypotension: No Has patient had a PCN reaction causing severe rash involving mucus membranes or skin necrosis: No Has patient had a PCN reaction that required hospitalization: No Has patient had a PCN reaction occurring within the last 10 years: No If all of the above answers are "NO", then may proceed with Cephalosporin use.    Past Medical History:  Diagnosis Date  . Anemia    not taking her iron pills either  . Anxiety   . Depression   . Gallstones   . GERD (gastroesophageal reflux disease)   . Headache    "occasional"  . History of blood transfusion    blood after miscarrige  . Hypertension    doesn't take her bp pills  . Hypothyroidism    again, takes no med  . Morbid obesity (HCC) 01/16/2019  . Obesity   . Panic attack     Current Outpatient Medications:  .  aspirin EC 81 MG tablet, Take 81 mg by mouth daily as needed (for chest pain)., Disp: , Rfl:  .  colestipol (COLESTID) 1 g tablet, Take 2 tablets by mouth twice daily, Disp: 120 tablet, Rfl: 3 .  FLUoxetine (PROZAC) 20 MG capsule, Take 1 capsule (20 mg  total) by mouth daily., Disp: 30 capsule, Rfl: 1 .  levothyroxine (SYNTHROID) 75 MCG tablet, Take 1 tablet (75 mcg total) by mouth daily., Disp: 90 tablet, Rfl: 0 .  pantoprazole (PROTONIX) 40 MG tablet, Take 1 tablet by mouth once daily, Disp: 90 tablet, Rfl: 0 .  tiZANidine (ZANAFLEX) 4 MG tablet, Take 0.5-1 tablets (2-4 mg total) by mouth every 8 (eight) hours as needed for muscle spasms., Disp: 30 tablet, Rfl: 1  Depression screen [facility] 2/9 05/07/2019 04/23/2019 01/16/2019  Decreased Interest 3 0 1  Down, Depressed, Hopeless 1 0 0  PHQ - 2 Score 4 0 1  Altered sleeping 1 - 0  Tired, decreased energy 3 - 1  Change in appetite 3 - 1  Feeling bad or failure about yourself  3 - 0  Trouble concentrating 1 - 0  Moving slowly or fidgety/restless 0 - 0  Suicidal thoughts 0 - 0  PHQ-9 Score 15 - 3  Difficult doing work/chores Somewhat difficult - Not difficult at all  Some recent data might be hidden   GAD 7 : Generalized Anxiety Score 05/07/2019 12/24/2016  Nervous, Anxious, on Edge 3 0  Control/stop worrying 3 3  Worry too much - different things 3 3  Trouble relaxing 3 3  Restless 0 0  Easily annoyed or irritable 3 3  Afraid - awful might happen 0 2  Total GAD 7 Score 15 14  Anxiety Difficulty Somewhat difficult Somewhat difficult    Assessment/ Plan: 38 y.o. female   1. GAD (generalized anxiety disorder) Uncontrolled. Prozac 20mg  sent in over the weekend.  She has f/u with me in 6 weeks scheduled.  I have encouraged her to call me/ see me in office if needed prior to that time.  2. Mild episode of recurrent major depressive disorder (HCC) As abveo.   Start time: 7:52am End time: 7:58am  Total time spent on patient care (including telephone call/ virtual visit): 15 minutes  [name] Hulen SkainsM Jess Toney, DO [facility] [phone]

## 2019-05-13 ENCOUNTER — Encounter (HOSPITAL_COMMUNITY): Payer: Self-pay | Admitting: *Deleted

## 2019-05-13 ENCOUNTER — Other Ambulatory Visit: Payer: Self-pay

## 2019-05-13 ENCOUNTER — Emergency Department (HOSPITAL_COMMUNITY)
Admission: EM | Admit: 2019-05-13 | Discharge: 2019-05-13 | Disposition: A | Discharge status: Home / Self Care | Payer: Medicaid Other | Attending: Emergency Medicine | Admitting: Emergency Medicine

## 2019-05-13 DIAGNOSIS — M25511 Pain in right shoulder: Secondary | ICD-10-CM | POA: Diagnosis not present

## 2019-05-13 DIAGNOSIS — I10 Essential (primary) hypertension: Secondary | ICD-10-CM

## 2019-05-13 DIAGNOSIS — M542 Cervicalgia: Secondary | ICD-10-CM | POA: Insufficient documentation

## 2019-05-13 DIAGNOSIS — F17210 Nicotine dependence, cigarettes, uncomplicated: Secondary | ICD-10-CM | POA: Insufficient documentation

## 2019-05-13 DIAGNOSIS — E039 Hypothyroidism, unspecified: Secondary | ICD-10-CM | POA: Insufficient documentation

## 2019-05-13 DIAGNOSIS — Z79899 Other long term (current) drug therapy: Secondary | ICD-10-CM | POA: Insufficient documentation

## 2019-05-13 DIAGNOSIS — Z7982 Long term (current) use of aspirin: Secondary | ICD-10-CM | POA: Diagnosis not present

## 2019-05-13 MED ORDER — IBUPROFEN 800 MG PO TABS
800.0000 mg | ORAL_TABLET | Freq: Once | ORAL | Status: AC
  Administered 2019-05-13: 800 mg via ORAL
  Filled 2019-05-13: qty 1

## 2019-05-13 MED ORDER — ORPHENADRINE CITRATE ER 100 MG PO TB12: 100.0000 mg | ORAL_TABLET | Freq: Two times a day (BID) | ORAL | 0 refills | Status: DC

## 2019-05-13 MED ORDER — CYCLOBENZAPRINE HCL 10 MG PO TABS
10.0000 mg | ORAL_TABLET | Freq: Once | ORAL | Status: AC
  Administered 2019-05-13: 10 mg via ORAL
  Filled 2019-05-13: qty 1

## 2019-05-13 MED ORDER — NAPROXEN 500 MG PO TABS: 500.0000 mg | ORAL_TABLET | Freq: Two times a day (BID) | ORAL | 0 refills | Status: DC

## 2019-05-13 NOTE — ED Provider Notes (Signed)
[facility] Provider Note   CSN: [id number] Arrival date & time: 05/13/19  0222    History   Chief Complaint Chief Complaint  Patient presents with  . Arm Pain    HPI Amber Colon is a 38 y.o. female.   The history is provided by the patient.  She has history of hypertension, hypothyroidism, morbid obesity, GERD and comes in because of pain in her neck on the right side radiating down to her right shoulder and right arm all the way down to her fingers.  She noted tingling in all of her fingers.  This woke her up from sleep at about 1:30 AM.  She took a dose of aspirin for 125 mg, and pain is improved from 10/10 down to 2/10.  She denies any recent trauma or unusual activity.  She has not had similar pain in the past.  She denies any recent exposure to COVID-19.  Past Medical History:  Diagnosis Date  . Anemia    not taking her iron pills either  . Anxiety   . Depression   . Gallstones   . GERD (gastroesophageal reflux disease)   . Headache    "occasional"  . History of blood transfusion    blood after miscarrige  . Hypertension    doesn't take her bp pills  . Hypothyroidism    again, takes no med  . Morbid obesity (HCC) 01/16/2019  . Obesity   . Panic attack     Patient Active Problem List   Diagnosis Date Noted  . Left wrist pain 01/16/2019  . Dysphagia   . Gastritis and gastroduodenitis   . Family history of early CAD 09/20/2017  . 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 Depression 12/24/2016  . S/P cholecystectomy 09/23/2016  . Current smoker 02/27/2016  . GAD (generalized anxiety disorder) 01/22/2016  . GERD (gastroesophageal reflux disease) 01/22/2016  . Morbid obesity with BMI of 50.0-59.9, adult (HCC) 01/22/2016  . Metabolic syndrome 01/22/2016  . Hypothyroidism 06/18/2015  . Anemia, iron deficiency 06/18/2015  . Essential hypertension 06/18/2015    Past Surgical History:  Procedure Laterality Date  . CHOLECYSTECTOMY N/A  09/08/2016   Procedure: LAPAROSCOPIC CHOLECYSTECTOMY WITH INTRAOPERATIVE CHOLANGIOGRAM;  Surgeon: Chevis PrettyPaul Toth III, MD;  Location: [facility] OR;  Service: General;  Laterality: N/A;  . DILATION AND CURETTAGE OF UTERUS    . ESOPHAGOGASTRODUODENOSCOPY (EGD) WITH PROPOFOL N/A 02/23/2018   Procedure: ESOPHAGOGASTRODUODENOSCOPY (EGD) WITH PROPOFOL;  Surgeon: Rachael FeeJacobs, Daniel P, MD;  Location: [facility];  Service: Endoscopy;  Laterality: N/A;  . TUBAL LIGATION    . TYMPANOMASTOIDECTOMY Left 12/24/2015   Procedure: LEFT MODIFIED  RADICAL TYMPANOMASTOIDECTOMY;  Surgeon: Newman PiesSu Teoh, MD;  Location: [facility] OR;  Service: ENT;  Laterality: Left;  . TYMPANOMASTOIDECTOMY Right 07/07/2016   Procedure: RIGHT TYMPANOMASTOIDECTOMY;  Surgeon: Newman PiesSu Teoh, MD;  Location: [facility] OR;  Service: ENT;  Laterality: Right;     OB History    Gravida  4   Para      Term      Preterm      AB      Living  3     SAB      TAB      Ectopic      Multiple      Live Births               Home Medications    Prior to Admission medications   Medication Sig Start Date End Date Taking? Authorizing Provider  aspirin  EC 81 MG tablet Take 81 mg by mouth daily as needed (for chest pain).    [provider]  colestipol (COLESTID) 1 g tablet Take 2 tablets by mouth twice daily 03/12/19   Delynn Flavin M, DO  FLUoxetine (PROZAC) 20 MG capsule Take 1 capsule (20 mg total) by mouth daily. 05/04/19   Raliegh Ip, DO  levothyroxine (SYNTHROID) 75 MCG tablet Take 1 tablet (75 mcg total) by mouth daily. 04/25/19   Raliegh Ip, DO  naproxen (NAPROSYN) 500 MG tablet Take 1 tablet (500 mg total) by mouth 2 (two) times daily. 05/13/19   Dione Booze, MD  orphenadrine (NORFLEX) 100 MG tablet Take 1 tablet (100 mg total) by mouth 2 (two) times daily. 05/13/19   Dione Booze, MD  pantoprazole (PROTONIX) 40 MG tablet Take 1 tablet by mouth once daily 04/02/19   Delynn Flavin M, DO  tiZANidine (ZANAFLEX) 4 MG tablet Take 0.5-1  tablets (2-4 mg total) by mouth every 8 (eight) hours as needed for muscle spasms. 04/26/19   Raliegh Ip, DO    Family History Family History  Problem Relation Age of Onset  . Diabetes Mother   . Heart disease Mother   . Ovarian cancer Mother   . Stroke Mother   . Hypertension Mother   . Diabetes Father   . COPD Father   . Heart disease Father   . Anuerysm Father        Neck, stomach  . Breast cancer Maternal Grandmother   . Diabetes Maternal Grandfather   . Diabetes Paternal Aunt   . Diabetes Paternal Uncle     Social History Social History   Tobacco Use  . Smoking status: Current Some Day Smoker    Packs/day: 0.25    Years: 2.00    Pack years: 0.50    Types: Cigarettes  . Smokeless tobacco: Never Used  . Tobacco comment: 3-4 cigs a day  Substance Use Topics  . Alcohol use: No  . Drug use: No     Allergies   Amoxicillin   Review of Systems Review of Systems  All other systems reviewed and are negative.    Physical Exam Updated Vital Signs BP (!) 155/97 (BP Location: Right Arm)   Pulse 96   Temp (!) 96 F (35.6 C)   Resp 19   Ht  (1.651 m)   Wt (!) 166.8 kg   LMP 04/23/2019   SpO2 99%   BMI 61.19 kg/m   Physical Exam Vitals signs and nursing note reviewed.    Morbidly obese 38 year old female, resting comfortably and in no acute distress. Vital signs are significant for elevated blood pressure. Oxygen saturation is 99%, which is normal. Head is normocephalic and atraumatic. PERRLA, EOMI. Oropharynx is clear. Neck has moderate tenderness over the cervical spine, and moderate to severe tenderness over the right paracervical muscles.  There is no adenopathy or JVD. Back is nontender and there is no CVA tenderness. Lungs are clear without rales, wheezes, or rhonchi. Chest is nontender. Heart has regular rate and rhythm without murmur. Abdomen is soft, flat, nontender without masses or hepatosplenomegaly and peristalsis is normoactive.  Extremities: Marked tenderness rather diffusely throughout the right arm and shoulder with maximum tenderness in the anterior deltoid groove.  Rotator cuff impingement signs are present.  Distal neurovascular exam is intact with normal strength of all muscles of the right arm, normal sensation throughout, strong pulses, prompt capillary refill. Skin is warm and dry  without rash. Neurologic: Mental status is normal, cranial nerves are intact, there are no motor or sensory deficits.  ED Treatments / Results   Procedures Procedures   Medications Ordered in ED Medications  ibuprofen (ADVIL) tablet 800 mg (has no administration in time range)  cyclobenzaprine (FLEXERIL) tablet 10 mg (has no administration in time range)     Initial Impression / Assessment and Plan / ED Course  I have reviewed the triage vital signs and the nursing notes.  Pain extending from the cervical spine throughout the right arm, cause is not clear.  Possible cervical radiculopathy, possible rotator cuff injury.  Old records are reviewed, and she has no relevant past visits.  No indication for imaging today.  She is discharged with prescriptions for naproxen and orphenadrine and is referred back to her primary care provider for follow-up.  Final Clinical Impressions(s) / ED Diagnoses   Final diagnoses:  Pain in the neck  Acute pain of right shoulder    ED Discharge Orders         Ordered    naproxen (NAPROSYN) 500 MG tablet  2 times daily     05/13/19 0317    orphenadrine (NORFLEX) 100 MG tablet  2 times daily     05/13/19 0317           Dione Booze, MD 05/13/19 [phone]

## 2019-05-13 NOTE — Discharge Instructions (Signed)
Your pain could be coming from a disc in your neck, or your shoulder (rotator cuff).  Apply ice for 20-30 minutes at a time, as needed.  Return if symptoms are getting worse.

## 2019-05-13 NOTE — ED Triage Notes (Signed)
Pt states that she woke up with pain that starts at the neck area and radiates down right arm, denies any injury,

## 2019-05-14 IMAGING — DX DG FOOT COMPLETE 3+V*R*
3 series · 3 of 3 positions shown · non-contrast
Comparison: None.

CLINICAL DATA: Right foot pain, no known injury, initial encounter

EXAM:
RIGHT FOOT COMPLETE - 3+ VIEW

[foot ap]
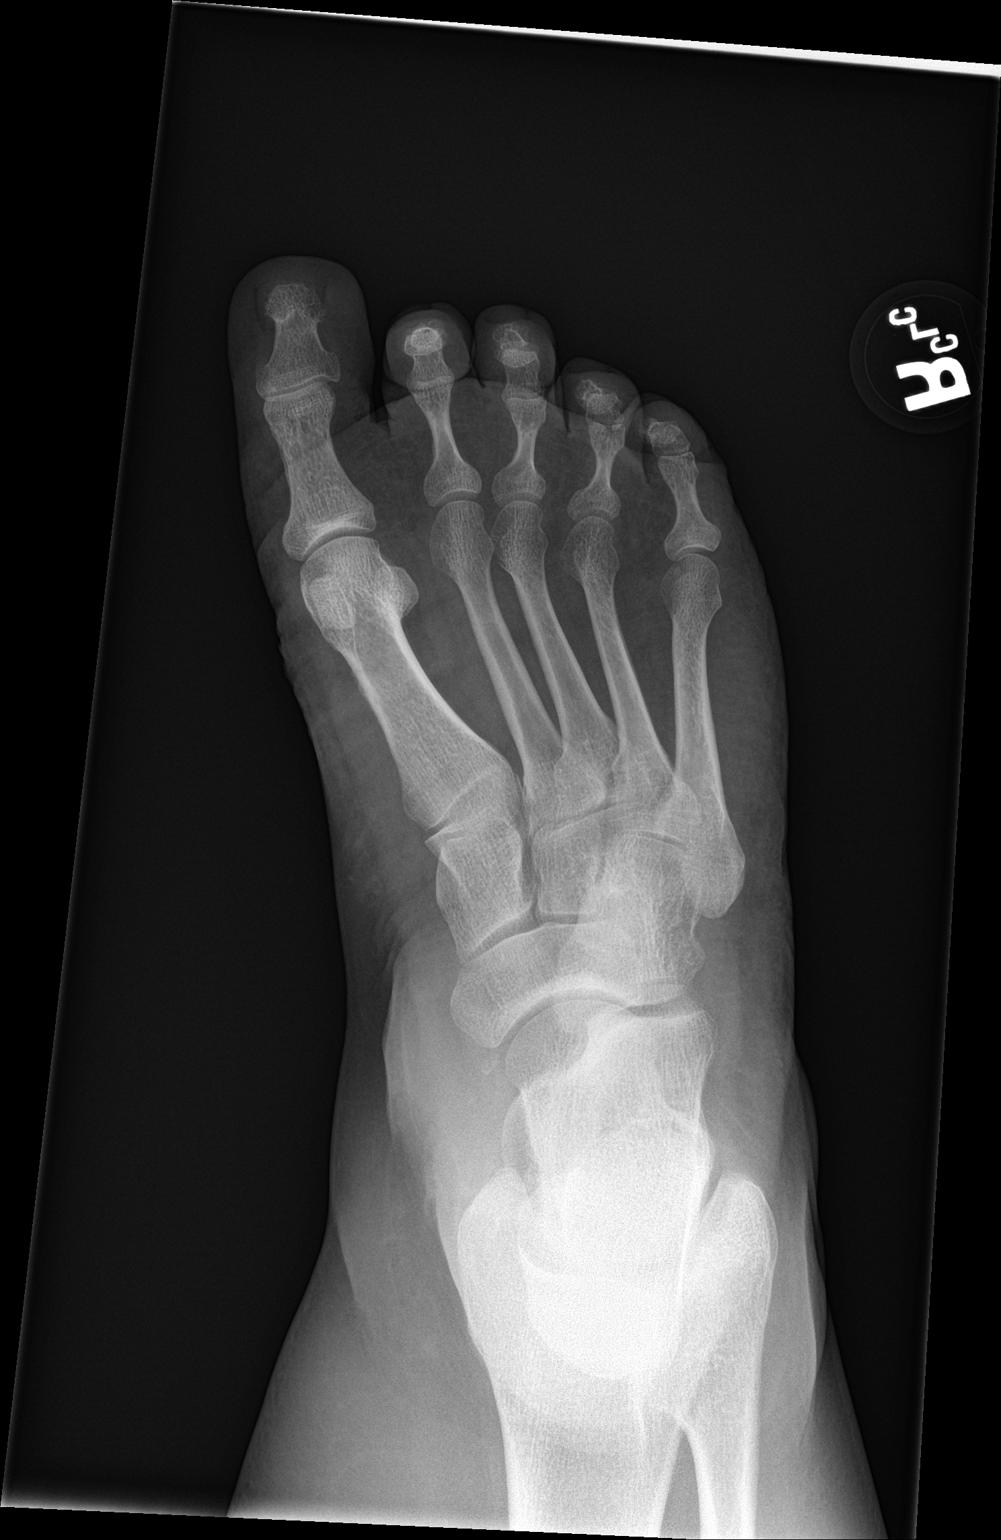

[foot obl]
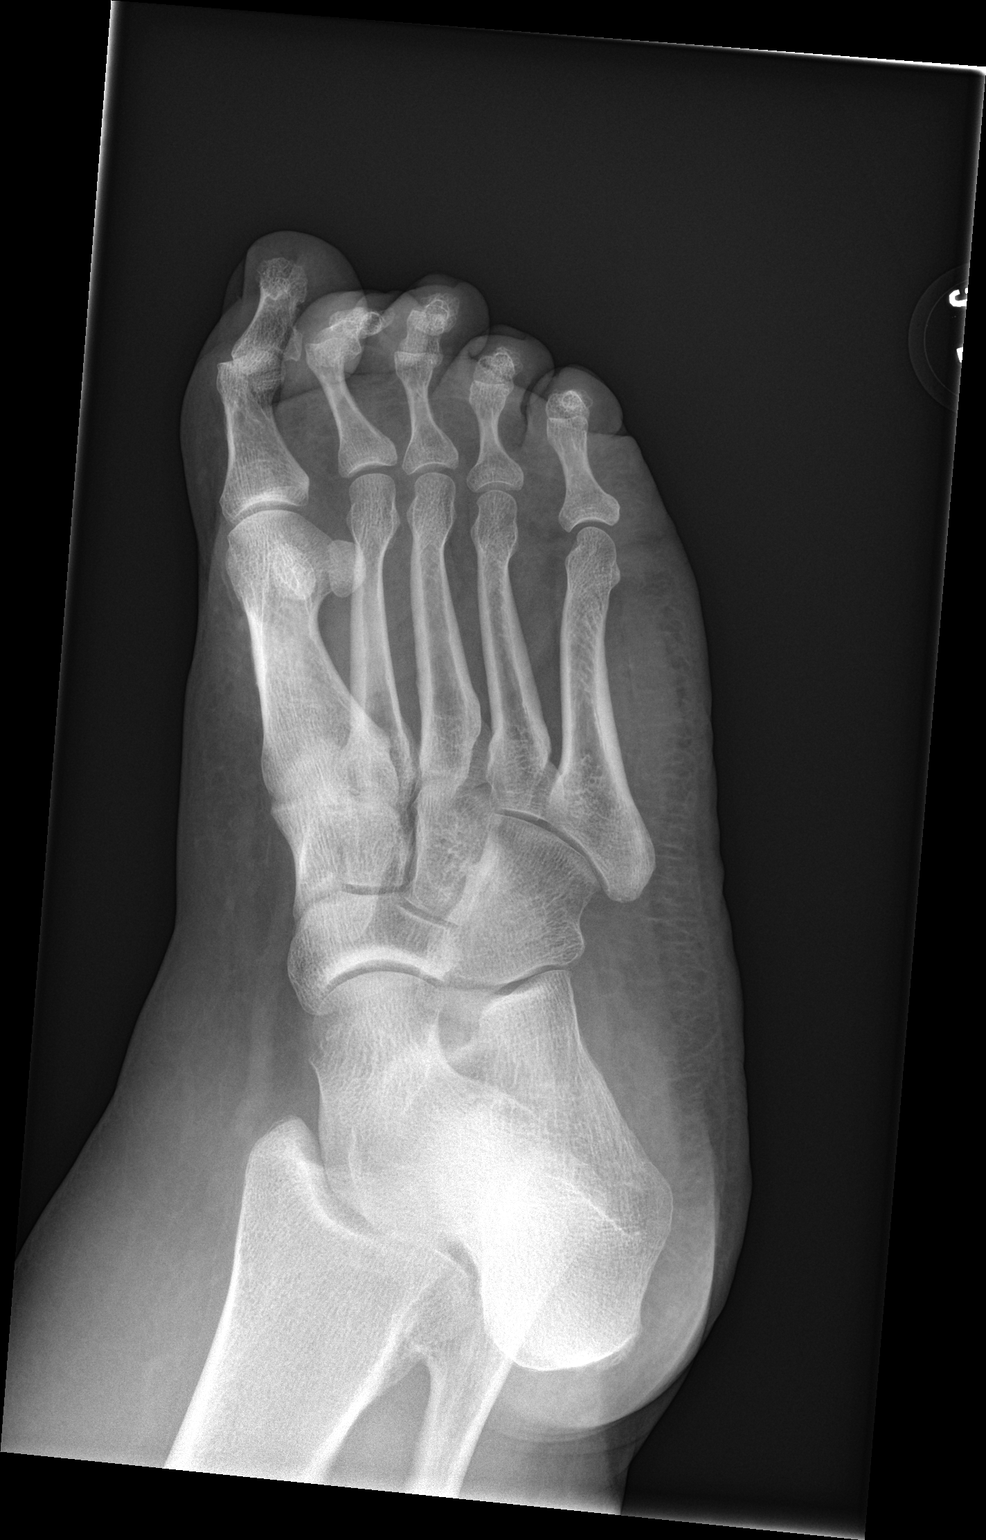

[foot lat]
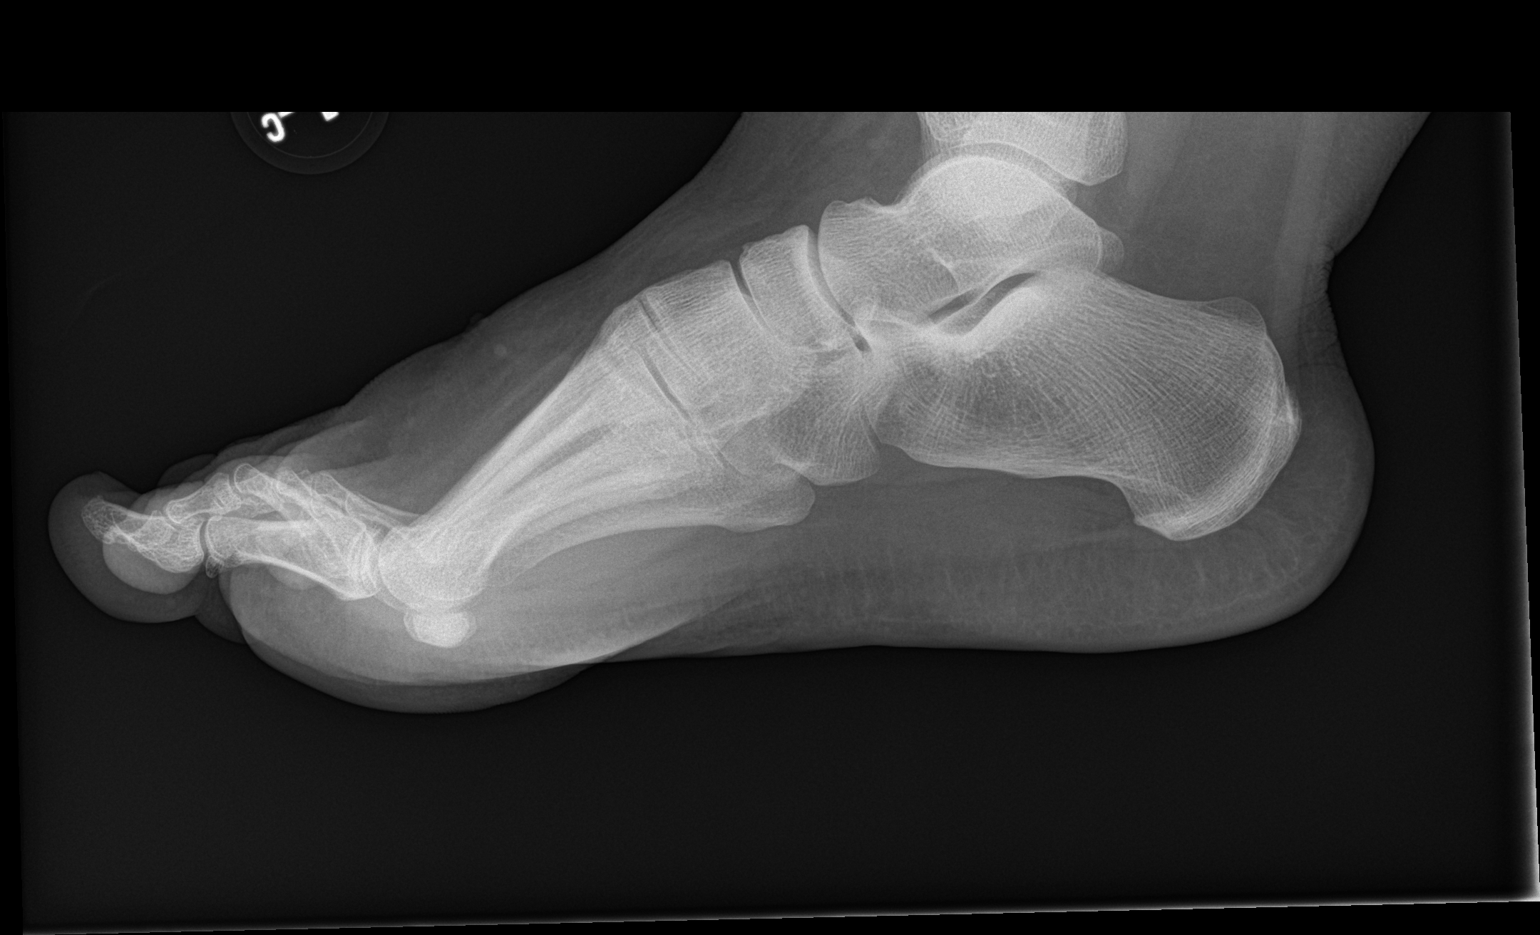

[3 of 3 positions shown; findings below may reference images not displayed]

FINDINGS: No acute fracture or dislocation is noted. No gross soft tissue
abnormality is noted.
IMPRESSION: No acute abnormality noted.

## 2019-06-07 ENCOUNTER — Ambulatory Visit: Payer: Medicaid Other | Admitting: Physician Assistant

## 2019-06-07 ENCOUNTER — Other Ambulatory Visit: Payer: Self-pay

## 2019-06-07 ENCOUNTER — Ambulatory Visit: Payer: Medicaid Other | Admitting: Family Medicine

## 2019-06-13 ENCOUNTER — Other Ambulatory Visit: Payer: Self-pay

## 2019-06-13 ENCOUNTER — Ambulatory Visit (INDEPENDENT_AMBULATORY_CARE_PROVIDER_SITE_OTHER): Payer: Medicaid Other | Admitting: Family Medicine

## 2019-06-13 DIAGNOSIS — F17200 Nicotine dependence, unspecified, uncomplicated: Secondary | ICD-10-CM | POA: Diagnosis not present

## 2019-06-13 DIAGNOSIS — Z6841 Body Mass Index (BMI) 40.0 and over, adult: Secondary | ICD-10-CM

## 2019-06-13 DIAGNOSIS — F330 Major depressive disorder, recurrent, mild: Secondary | ICD-10-CM | POA: Diagnosis not present

## 2019-06-13 DIAGNOSIS — F411 Generalized anxiety disorder: Secondary | ICD-10-CM | POA: Diagnosis not present

## 2019-06-13 MED ORDER — FLUOXETINE HCL 20 MG PO CAPS: 20.0000 mg | ORAL_CAPSULE | Freq: Every day | ORAL | 0 refills | Status: DC

## 2019-06-13 MED ORDER — NICOTINE 7 MG/24HR TD PT24: 7.0000 mg | MEDICATED_PATCH | Freq: Every day | TRANSDERMAL | 0 refills | Status: DC

## 2019-06-13 MED ORDER — BUPROPION HCL ER (XL) 150 MG PO TB24: 150.0000 mg | ORAL_TABLET | Freq: Every day | ORAL | 1 refills | Status: DC

## 2019-06-13 NOTE — Progress Notes (Signed)
Telephone visit  Subjective: CC: f/u GAD/ depression PCP: Raliegh IpGottschalk, Lashawndra Lampkins M, DO ZOX:WRUEAVWHPI:Amber Colon is a 38 y.o. female calls for telephone consult today. Patient provides verbal consent for consult held via phone.  Location of patient: home Location of provider: [facility] Others present for call: none  1. GAD/ Depression/ obesity Patient reports some improvement in her mood lability since our last visit.  She is tolerating the fluoxetine 20 mg daily without difficulty.  She continues to have fatigue and difficulty with weight loss.  She is does have some depressive symptoms of difficulty with concentration.  She is wondering if there is anything else that we can do to improve the symptoms.  She has been compliant with her Synthroid which she takes for hypothyroidism and which was recently increased in May.    2.  Tobacco use disorder Patient reports smoking about 7 cigarettes/day.  She denies any chest pain, shortness of breath, hemoptysis.  No unplanned weight loss.  She would like to stop smoking and is interested in nicotine patches.  She is asking for these to be sent to the pharmacy   ROS: Per HPI  Allergies  Allergen Reactions  . Amoxicillin Diarrhea, Nausea And Vomiting and Other (See Comments)    Has patient had a PCN reaction causing immediate rash, facial/tongue/throat swelling, SOB or lightheadedness with hypotension: No Has patient had a PCN reaction causing severe rash involving mucus membranes or skin necrosis: No Has patient had a PCN reaction that required hospitalization: No Has patient had a PCN reaction occurring within the last 10 years: No If all of the above answers are "NO", then may proceed with Cephalosporin use.    Past Medical History:  Diagnosis Date  . Anemia    not taking her iron pills either  . Anxiety   . Depression   . Gallstones   . GERD (gastroesophageal reflux disease)   . Headache    "occasional"  . History of blood transfusion    blood  after miscarrige  . Hypertension    doesn't take her bp pills  . Hypothyroidism    again, takes no med  . Morbid obesity (HCC) 01/16/2019  . Obesity   . Panic attack     Current Outpatient Medications:  .  aspirin EC 81 MG tablet, Take 81 mg by mouth daily as needed (for chest pain)., Disp: , Rfl:  .  colestipol (COLESTID) 1 g tablet, Take 2 tablets by mouth twice daily, Disp: 120 tablet, Rfl: 3 .  FLUoxetine (PROZAC) 20 MG capsule, Take 1 capsule (20 mg total) by mouth daily., Disp: 30 capsule, Rfl: 1 .  levothyroxine (SYNTHROID) 75 MCG tablet, Take 1 tablet (75 mcg total) by mouth daily., Disp: 90 tablet, Rfl: 0 .  naproxen (NAPROSYN) 500 MG tablet, Take 1 tablet (500 mg total) by mouth 2 (two) times daily., Disp: 30 tablet, Rfl: 0 .  orphenadrine (NORFLEX) 100 MG tablet, Take 1 tablet (100 mg total) by mouth 2 (two) times daily., Disp: 30 tablet, Rfl: 0 .  pantoprazole (PROTONIX) 40 MG tablet, Take 1 tablet by mouth once daily, Disp: 90 tablet, Rfl: 0 .  tiZANidine (ZANAFLEX) 4 MG tablet, Take 0.5-1 tablets (2-4 mg total) by mouth every 8 (eight) hours as needed for muscle spasms., Disp: 30 tablet, Rfl: 1  Depression screen [employer] 2/9 06/13/2019 05/07/2019 04/23/2019  Decreased Interest 1 3 0  Down, Depressed, Hopeless 0 1 0  PHQ - 2 Score 1 4 0  Altered sleeping 3  1 -  Tired, decreased energy 3 3 -  Change in appetite 3 3 -  Feeling bad or failure about yourself  3 3 -  Trouble concentrating 2 1 -  Moving slowly or fidgety/restless 3 0 -  Suicidal thoughts 0 0 -  PHQ-9 Score 18 15 -  Difficult doing work/chores Somewhat difficult Somewhat difficult -  Some recent data might be hidden   GAD 7 : Generalized Anxiety Score 06/13/2019 05/07/2019 12/24/2016  Nervous, Anxious, on Edge 0 3 0  Control/stop worrying 0 3 3  Worry too much - different things 0 3 3  Trouble relaxing 1 3 3   Restless 3 0 0  Easily annoyed or irritable 3 3 3   Afraid - awful might happen 1 0 2  Total GAD 7 Score 8  15 14   Anxiety Difficulty Somewhat difficult Somewhat difficult Somewhat difficult    Assessment/ Plan: 38 y.o. female   1. GAD (generalized anxiety disorder) Improving with fluoxetine.  I renewed this medication at current dose - FLUoxetine (PROZAC) 20 MG capsule; Take 1 capsule (20 mg total) by mouth daily.  Dispense: 90 capsule; Refill: 0  2. Mild episode of recurrent major depressive disorder ([place]) Still has depressive symptoms, difficulty focusing and issues with fatigue.  We will add Wellbutrin given ongoing depressive symptoms. - buPROPion (WELLBUTRIN XL) 150 MG 24 hr tablet; Take 1 tablet (150 mg total) by mouth daily.  Dispense: 30 tablet; Refill: 1 - FLUoxetine (PROZAC) 20 MG capsule; Take 1 capsule (20 mg total) by mouth daily.  Dispense: 90 capsule; Refill: 0  3. Current smoker I have prescribed nicotine patches but hopefully the Wellbutrin will also help with this. - buPROPion (WELLBUTRIN XL) 150 MG 24 hr tablet; Take 1 tablet (150 mg total) by mouth daily.  Dispense: 30 tablet; Refill: 1 - nicotine (NICODERM CQ - DOSED IN MG/24 HR) 7 mg/24hr patch; Place 1 patch (7 mg total) onto the skin daily.  Dispense: 28 patch; Refill: 0  4. Morbid obesity with BMI of 50.0-59.9, adult ([place]) We discussed that Wellbutrin may help with appetite suppression and improvement in energy.  We will plan to recheck her thyroid panel as well which was noted to be sub-therapeutic in May.  Her follow-up visit has been scheduled she is aware of date and time - buPROPion (WELLBUTRIN XL) 150 MG 24 hr tablet; Take 1 tablet (150 mg total) by mouth daily.  Dispense: 30 tablet; Refill: 1   Start time: 10:11am End time: 10:19am  Total time spent on patient care (including telephone call/ virtual visit): 16 minutes  [place], [place] [phone]

## 2019-06-18 ENCOUNTER — Ambulatory Visit (INDEPENDENT_AMBULATORY_CARE_PROVIDER_SITE_OTHER): Payer: Medicaid Other | Admitting: Otolaryngology

## 2019-06-19 ENCOUNTER — Ambulatory Visit: Payer: Self-pay | Admitting: Family Medicine

## 2019-07-01 ENCOUNTER — Other Ambulatory Visit: Payer: Self-pay | Admitting: Family Medicine

## 2019-07-01 DIAGNOSIS — K219 Gastro-esophageal reflux disease without esophagitis: Secondary | ICD-10-CM

## 2019-07-11 DIAGNOSIS — G243 Spasmodic torticollis: Secondary | ICD-10-CM | POA: Diagnosis not present

## 2019-07-11 DIAGNOSIS — M9900 Segmental and somatic dysfunction of head region: Secondary | ICD-10-CM | POA: Diagnosis not present

## 2019-07-11 DIAGNOSIS — M9902 Segmental and somatic dysfunction of thoracic region: Secondary | ICD-10-CM | POA: Diagnosis not present

## 2019-07-11 DIAGNOSIS — M9901 Segmental and somatic dysfunction of cervical region: Secondary | ICD-10-CM | POA: Diagnosis not present

## 2019-07-12 DIAGNOSIS — M9900 Segmental and somatic dysfunction of head region: Secondary | ICD-10-CM | POA: Diagnosis not present

## 2019-07-12 DIAGNOSIS — M9901 Segmental and somatic dysfunction of cervical region: Secondary | ICD-10-CM | POA: Diagnosis not present

## 2019-07-12 DIAGNOSIS — G243 Spasmodic torticollis: Secondary | ICD-10-CM | POA: Diagnosis not present

## 2019-07-12 DIAGNOSIS — M9902 Segmental and somatic dysfunction of thoracic region: Secondary | ICD-10-CM | POA: Diagnosis not present

## 2019-07-18 DIAGNOSIS — M9900 Segmental and somatic dysfunction of head region: Secondary | ICD-10-CM | POA: Diagnosis not present

## 2019-07-18 DIAGNOSIS — M9901 Segmental and somatic dysfunction of cervical region: Secondary | ICD-10-CM | POA: Diagnosis not present

## 2019-07-18 DIAGNOSIS — G243 Spasmodic torticollis: Secondary | ICD-10-CM | POA: Diagnosis not present

## 2019-07-18 DIAGNOSIS — M9902 Segmental and somatic dysfunction of thoracic region: Secondary | ICD-10-CM | POA: Diagnosis not present

## 2019-07-19 ENCOUNTER — Ambulatory Visit (INDEPENDENT_AMBULATORY_CARE_PROVIDER_SITE_OTHER): Payer: Medicaid Other | Admitting: Otolaryngology

## 2019-07-19 DIAGNOSIS — H95123 Granulation of postmastoidectomy cavity, bilateral ears: Secondary | ICD-10-CM | POA: Diagnosis not present

## 2019-07-23 ENCOUNTER — Ambulatory Visit: Payer: Self-pay | Admitting: Family Medicine

## 2019-07-23 DIAGNOSIS — M9901 Segmental and somatic dysfunction of cervical region: Secondary | ICD-10-CM | POA: Diagnosis not present

## 2019-07-23 DIAGNOSIS — G243 Spasmodic torticollis: Secondary | ICD-10-CM | POA: Diagnosis not present

## 2019-07-23 DIAGNOSIS — M9902 Segmental and somatic dysfunction of thoracic region: Secondary | ICD-10-CM | POA: Diagnosis not present

## 2019-07-23 DIAGNOSIS — M9900 Segmental and somatic dysfunction of head region: Secondary | ICD-10-CM | POA: Diagnosis not present

## 2019-07-24 ENCOUNTER — Encounter: Payer: Self-pay | Admitting: Family Medicine

## 2019-07-25 ENCOUNTER — Other Ambulatory Visit: Payer: Self-pay | Admitting: Family Medicine

## 2019-08-10 ENCOUNTER — Other Ambulatory Visit: Payer: Self-pay | Admitting: Family Medicine

## 2019-08-10 DIAGNOSIS — F17200 Nicotine dependence, unspecified, uncomplicated: Secondary | ICD-10-CM

## 2019-08-10 DIAGNOSIS — F330 Major depressive disorder, recurrent, mild: Secondary | ICD-10-CM

## 2019-08-10 DIAGNOSIS — Z6841 Body Mass Index (BMI) 40.0 and over, adult: Secondary | ICD-10-CM

## 2019-08-21 ENCOUNTER — Ambulatory Visit (INDEPENDENT_AMBULATORY_CARE_PROVIDER_SITE_OTHER): Payer: Medicaid Other | Admitting: Family Medicine

## 2019-08-21 DIAGNOSIS — M25562 Pain in left knee: Secondary | ICD-10-CM | POA: Diagnosis not present

## 2019-08-21 DIAGNOSIS — Z6841 Body Mass Index (BMI) 40.0 and over, adult: Secondary | ICD-10-CM | POA: Diagnosis not present

## 2019-08-21 DIAGNOSIS — F411 Generalized anxiety disorder: Secondary | ICD-10-CM

## 2019-08-21 DIAGNOSIS — E8881 Metabolic syndrome: Secondary | ICD-10-CM | POA: Diagnosis not present

## 2019-08-21 DIAGNOSIS — E039 Hypothyroidism, unspecified: Secondary | ICD-10-CM

## 2019-08-21 DIAGNOSIS — M5441 Lumbago with sciatica, right side: Secondary | ICD-10-CM

## 2019-08-21 DIAGNOSIS — D649 Anemia, unspecified: Secondary | ICD-10-CM | POA: Diagnosis not present

## 2019-08-21 DIAGNOSIS — F330 Major depressive disorder, recurrent, mild: Secondary | ICD-10-CM

## 2019-08-21 DIAGNOSIS — G8929 Other chronic pain: Secondary | ICD-10-CM

## 2019-08-21 DIAGNOSIS — M5442 Lumbago with sciatica, left side: Secondary | ICD-10-CM | POA: Diagnosis not present

## 2019-08-21 DIAGNOSIS — Z8249 Family history of ischemic heart disease and other diseases of the circulatory system: Secondary | ICD-10-CM | POA: Diagnosis not present

## 2019-08-21 DIAGNOSIS — M25561 Pain in right knee: Secondary | ICD-10-CM

## 2019-08-21 MED ORDER — FLUOXETINE HCL 20 MG PO CAPS: 20.0000 mg | ORAL_CAPSULE | Freq: Every day | ORAL | 1 refills | Status: DC

## 2019-08-21 NOTE — Progress Notes (Signed)
Telephone visit  Subjective: CC: f/u GAD PCP: Raliegh IpGottschalk,  M, DO ZOX:WRUEAVWHPI:Amber Colon is a 38 y.o. female calls for telephone consult today. Patient provides verbal consent for consult held via phone.  Location of patient: home Location of provider: [facility] Others present for call: none  1.  Generalized anxiety disorder with depressive disorder/tobacco use disorder/obesity Patient notes that Wellbutrin seem to help for the first couple of days with her depression and smoking but stopped working after that.  She is been out of the medicine for about a week now.  She was unaware that there was a refill sent on 24 August.  She notes that she is quit successfully from smoking cold [place] for about 3 days.  She never started the patches because she "did not want to get nightmares".  She also notes that it only helped with her appetite for a couple of days and she has since gone back to her normal ways.  She is wanting to go ahead and see a bariatric surgeon if she is eligible as she has been unsuccessful with weight loss.  2.  Bilateral knee pain and back pain Patient with longstanding history of bilateral back pain with associated sciatica.  She has bilateral knee pain.  She went ahead and saw a chiropractor and she notes that they did something to her knees and her knees have since ached more.  She is wondering if she should go to a different chiropractor or if she should see a different specialist.  She notes that she has difficulty with ambulation secondary to pain.  No falls.  She is feeling over-the-counter medications for pain.  3.  Hypothyroidism Patient is compliant with Synthroid.  She denies any unplanned weight loss and if anything she is having difficulty losing weight.  No change in voice or difficulty swallowing.   ROS: Per HPI  Allergies  Allergen Reactions  . Amoxicillin Diarrhea, Nausea And Vomiting and Other (See Comments)    Has patient had a PCN reaction causing immediate  rash, facial/tongue/throat swelling, SOB or lightheadedness with hypotension: No Has patient had a PCN reaction causing severe rash involving mucus membranes or skin necrosis: No Has patient had a PCN reaction that required hospitalization: No Has patient had a PCN reaction occurring within the last 10 years: No If all of the above answers are "NO", then may proceed with Cephalosporin use.    Past Medical History:  Diagnosis Date  . Anemia    not taking her iron pills either  . Anxiety   . Depression   . Gallstones   . GERD (gastroesophageal reflux disease)   . Headache    "occasional"  . History of blood transfusion    blood after miscarrige  . Hypertension    doesn't take her bp pills  . Hypothyroidism    again, takes no med  . Morbid obesity (HCC) 01/16/2019  . Obesity   . Panic attack     Current Outpatient Medications:  .  aspirin EC 81 MG tablet, Take 81 mg by mouth daily as needed (for chest pain)., Disp: , Rfl:  .  colestipol (COLESTID) 1 g tablet, Take 2 tablets by mouth twice daily, Disp: 120 tablet, Rfl: 3 .  FLUoxetine (PROZAC) 20 MG capsule, Take 1 capsule (20 mg total) by mouth daily., Disp: 90 capsule, Rfl: 0 .  levothyroxine (EUTHYROX) 75 MCG tablet, Take 1 tablet (75 mcg total) by mouth daily. (need repeat labs), Disp: 30 tablet, Rfl: 0 .  pantoprazole (PROTONIX) 40 MG tablet, Take 1 tablet by mouth once daily, Disp: 90 tablet, Rfl: 0 .  tiZANidine (ZANAFLEX) 4 MG tablet, Take 0.5-1 tablets (2-4 mg total) by mouth every 8 (eight) hours as needed for muscle spasms., Disp: 30 tablet, Rfl: 1  Depression screen [facility] 2/9 08/21/2019 06/13/2019 05/07/2019  Decreased Interest - 1 3  Down, Depressed, Hopeless 1 0 1  PHQ - 2 Score 1 1 4   Altered sleeping 3 3 1   Tired, decreased energy 3 3 3   Change in appetite 0 3 3  Feeling bad or failure about yourself  3 3 3   Trouble concentrating 1 2 1   Moving slowly or fidgety/restless 0 3 0  Suicidal thoughts 0 0 0  PHQ-9 Score  11 18 15   Difficult doing work/chores Somewhat difficult Somewhat difficult Somewhat difficult  Some recent data might be hidden   GAD 7 : Generalized Anxiety Score 08/21/2019 06/13/2019 05/07/2019 12/24/2016  Nervous, Anxious, on Edge 0 0 3 0  Control/stop worrying 3 0 3 3  Worry too much - different things 3 0 3 3  Trouble relaxing 3 1 3 3   Restless 0 3 0 0  Easily annoyed or irritable 3 3 3 3   Afraid - awful might happen 3 1 0 2  Total GAD 7 Score 15 8 15 14   Anxiety Difficulty Somewhat difficult Somewhat difficult Somewhat difficult Somewhat difficult    Assessment/ Plan: 38 y.o. female   1. Bilateral chronic knee pain Likely due to morbid obesity.  I am placing referral both to bariatric surgery and orthopedics for further evaluation.  Continue ice therapy. - Ambulatory referral to Orthopedic Surgery - Amb Referral to Bariatric Surgery  2. Chronic bilateral low back pain with bilateral sciatica - Ambulatory referral to Orthopedic Surgery - Amb Referral to Bariatric Surgery  3. Acquired hypothyroidism Last TSH was abnormal.  Her dose was adjusted.  Check thyroid panel. - Thyroid Panel With TSH; Future  4. Morbid obesity with BMI of 50.0-59.9, adult ([place]) Ongoing and hypothyroidism likely contributing some.  However, she is not made much headway with lifestyle modification.  I have placed a referral to bariatric surgery but we did discuss that she may indeed need to see other specialist before being eligible.  We will check a fasting lipid panel and A1c given history of metabolic syndrome - Amb Referral to Bariatric Surgery - Lipid Panel; Future - Bayer DCA Hb A1c Waived; Future  5. Metabolic syndrome - Amb Referral to Bariatric Surgery - Lipid Panel; Future - Bayer DCA Hb A1c Waived; Future  6. Family history of early CAD - Amb Referral to Bariatric Surgery  7. GAD (generalized anxiety disorder) Her GAD 7 score has increased some since last visit.  I wonder if this is  related to recent use of Wellbutrin.  She subjectively reports stability and therefore we will continue Prozac at 20 mg daily. - FLUoxetine (PROZAC) 20 MG capsule; Take 1 capsule (20 mg total) by mouth daily.  Dispense: 90 capsule; Refill: 1  8. Mild episode of recurrent major depressive disorder (HCC) - FLUoxetine (PROZAC) 20 MG capsule; Take 1 capsule (20 mg total) by mouth daily.  Dispense: 90 capsule; Refill: 1  9. Anemia, unspecified type - CBC; Future   Start time: 11:31am End time: 11:44am  Total time spent on patient care (including telephone call/ virtual visit): 22 minutes  [place], [place] [phone]

## 2019-08-23 ENCOUNTER — Other Ambulatory Visit: Payer: Self-pay

## 2019-08-23 ENCOUNTER — Ambulatory Visit (INDEPENDENT_AMBULATORY_CARE_PROVIDER_SITE_OTHER): Payer: Medicaid Other | Admitting: Orthopaedic Surgery

## 2019-08-23 ENCOUNTER — Ambulatory Visit: Payer: Self-pay

## 2019-08-23 ENCOUNTER — Ambulatory Visit (INDEPENDENT_AMBULATORY_CARE_PROVIDER_SITE_OTHER): Payer: Medicaid Other

## 2019-08-23 ENCOUNTER — Encounter: Payer: Self-pay | Admitting: Orthopaedic Surgery

## 2019-08-23 VITALS — BP 141/79 | HR 88 | Ht 65.0 in | Wt 374.0 lb

## 2019-08-23 DIAGNOSIS — Z6841 Body Mass Index (BMI) 40.0 and over, adult: Secondary | ICD-10-CM

## 2019-08-23 DIAGNOSIS — M25562 Pain in left knee: Secondary | ICD-10-CM | POA: Insufficient documentation

## 2019-08-23 DIAGNOSIS — M545 Low back pain, unspecified: Secondary | ICD-10-CM

## 2019-08-23 DIAGNOSIS — G8929 Other chronic pain: Secondary | ICD-10-CM

## 2019-08-23 DIAGNOSIS — M25561 Pain in right knee: Secondary | ICD-10-CM | POA: Diagnosis not present

## 2019-08-23 MED ORDER — METHYLPREDNISOLONE ACETATE 40 MG/ML IJ SUSP
40.0000 mg | INTRAMUSCULAR | Status: AC | PRN
  Administered 2019-08-23: 40 mg via INTRA_ARTICULAR

## 2019-08-23 MED ORDER — BUPIVACAINE HCL 0.5 % IJ SOLN
3.0000 mL | INTRAMUSCULAR | Status: AC | PRN
  Administered 2019-08-23: 13:00:00 3 mL via INTRA_ARTICULAR

## 2019-08-23 MED ORDER — LIDOCAINE HCL 1 % IJ SOLN
0.5000 mL | INTRAMUSCULAR | Status: AC | PRN
  Administered 2019-08-23: .5 mL

## 2019-08-23 MED ORDER — BUPIVACAINE HCL 0.25 % IJ SOLN
4.0000 mL | INTRAMUSCULAR | Status: AC | PRN
  Administered 2019-08-23: 13:00:00 4 mL via INTRA_ARTICULAR

## 2019-08-23 NOTE — Progress Notes (Signed)
Office Visit Note   Patient: Amber Colon           Date of Birth: 03/14/1981           MRN: [id number] Visit Date: 08/23/2019              Requested by: Raliegh IpGottschalk, Ashly M, DO [address] [place],  [place] [id number] PCP: Raliegh IpGottschalk, Ashly M, DO   Assessment & Plan: Visit Diagnoses:  1. Chronic pain of both knees   2. Low back pain, unspecified back pain laterality, unspecified chronicity, unspecified whether sciatica present     Plan: Bilateral knee intra-articular injections performed which she tolerated well.  We discussed weight loss encouraged her to proceed with possible bariatric surgery to help with her history of knee pain back pain, metabolic syndrome.  Follow-Up Instructions: No follow-ups on file.   Orders:  Orders Placed This Encounter  Procedures  . XR Lumbar Spine 2-3 Views  . XR Knee 1-2 Views Left  . XR Knee 1-2 Views Right   No orders of the defined types were placed in this encounter.     Procedures: Large Joint Inj: R knee on 08/23/2019 12:41 PM Indications: pain and joint swelling Details: 22 G 1.5 in needle, anterolateral approach  Arthrogram: No  Medications: 40 mg methylPREDNISolone acetate 40 MG/ML; 0.5 mL lidocaine 1 %; 4 mL bupivacaine 0.25 % Outcome: tolerated well, no immediate complications Procedure, treatment alternatives, risks and benefits explained, specific risks discussed. Consent was given by the patient. Immediately prior to procedure a time out was called to verify the correct patient, procedure, equipment, support staff and site/side marked as required. Patient was prepped and draped in the usual sterile fashion.   Large Joint Inj: L knee on 08/23/2019 12:41 PM Indications: joint swelling and pain Details: 22 G 1.5 in needle, anterolateral approach  Arthrogram: No  Medications: 0.5 mL lidocaine 1 %; 3 mL bupivacaine 0.5 %; 40 mg methylPREDNISolone acetate 40 MG/ML Outcome: tolerated well, no immediate complications Procedure,  treatment alternatives, risks and benefits explained, specific risks discussed. Consent was given by the patient. Immediately prior to procedure a time out was called to verify the correct patient, procedure, equipment, support staff and site/side marked as required. Patient was prepped and draped in the usual sterile fashion.       Clinical Data: No additional findings.   Subjective: Chief Complaint  Patient presents with  . Right Knee - Pain  . Left Knee - Pain  . Lower Back - Pain    HPI 38 year old female with BMI 62 with chronic low back pain and bilateral knee pain.  She states she has had pain for several years difficulty walking.  She states she has a referral coming up for possible weight loss bariatric surgery.  Patient has been on thyroid supplement in the past and she states when he was on a higher dose that she lost weight when the dosage was decreased she regained the weight.  She denies numbness or tingling in her legs she has great problems with stairs.  Problems getting up from a low chair.  She denies chills or fever.  Review of Systems Positive for morbid obesity.  Previous ear surgeries gallbladder surgery 2017.  Positive for acid reflux anxiety depression hypertension thyroid condition.  Objective: Vital Signs: BP (!) 141/79   Pulse 88   Ht 5\' 5"  (1.651 m)   Wt (!) 374 lb (169.6 kg)   BMI 62.24 kg/m   Physical Exam  Constitutional:      Appearance: She is well-developed.  HENT:     Head: Normocephalic.     Right Ear: External ear normal.     Left Ear: External ear normal.  Eyes:     Pupils: Pupils are equal, round, and reactive to light.  Neck:     Thyroid: No thyromegaly.     Trachea: No tracheal deviation.  Cardiovascular:     Rate and Rhythm: Normal rate.  Pulmonary:     Effort: Pulmonary effort is normal.  Abdominal:     Palpations: Abdomen is soft.  Skin:    General: Skin is warm and dry.  Neurological:     Mental Status: She is alert and  oriented to person, place, and time.  Psychiatric:        Behavior: Behavior normal.     Ortho Exam negative logroll right and left hip.  Hips reach full extension.  Knee and ankle jerk are intact.  She may have some synovitis bilaterally difficult to determine with her body habitus.  Crepitus with knee extension patellofemoral loading right and left.  Some tenderness medial lateral joint line.  Specialty Comments:  No specialty comments available.  Imaging: No results found.   PMFS History: Patient Active Problem List   Diagnosis Date Noted  . Left wrist pain 01/16/2019  . Dysphagia   . Gastritis and gastroduodenitis   . Family history of early CAD 09/20/2017  . 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 Depression 12/24/2016  . S/P cholecystectomy 09/23/2016  . Current smoker 02/27/2016  . GAD (generalized anxiety disorder) 01/22/2016  . GERD (gastroesophageal reflux disease) 01/22/2016  . Morbid obesity with BMI of 50.0-59.9, adult ([place]) 01/22/2016  . Metabolic syndrome 16/09/9603  . Hypothyroidism 06/18/2015  . Anemia, iron deficiency 06/18/2015  . Essential hypertension 06/18/2015   Past Medical History:  Diagnosis Date  . Anemia    not taking her iron pills either  . Anxiety   . Depression   . Gallstones   . GERD (gastroesophageal reflux disease)   . Headache    "occasional"  . History of blood transfusion    blood after miscarrige  . Hypertension    doesn't take her bp pills  . Hypothyroidism    again, takes no med  . Morbid obesity ([place]) 01/16/2019  . Obesity   . Panic attack     Family History  Problem Relation Age of Onset  . Diabetes Mother   . Heart disease Mother   . Ovarian cancer Mother   . Stroke Mother   . Hypertension Mother   . Diabetes Father   . COPD Father   . Heart disease Father   . Anuerysm Father        Neck, stomach  . Breast cancer Maternal Grandmother   . Diabetes Maternal Grandfather   . Diabetes Paternal Aunt   .  Diabetes Paternal Uncle     Past Surgical History:  Procedure Laterality Date  . CHOLECYSTECTOMY N/A 09/08/2016   Procedure: LAPAROSCOPIC CHOLECYSTECTOMY WITH INTRAOPERATIVE CHOLANGIOGRAM;  Surgeon: Autumn Messing III, MD;  Location: [facility];  Service: General;  Laterality: N/A;  . DILATION AND CURETTAGE OF UTERUS    . ESOPHAGOGASTRODUODENOSCOPY (EGD) WITH PROPOFOL N/A 02/23/2018   Procedure: ESOPHAGOGASTRODUODENOSCOPY (EGD) WITH PROPOFOL;  Surgeon: Milus Banister, MD;  Location: [facility];  Service: Endoscopy;  Laterality: N/A;  . TUBAL LIGATION    . TYMPANOMASTOIDECTOMY Left 12/24/2015   Procedure: LEFT MODIFIED  RADICAL TYMPANOMASTOIDECTOMY;  Surgeon: Leta Baptist,  MD;  Location: [facility];  Service: ENT;  Laterality: Left;  . TYMPANOMASTOIDECTOMY Right 07/07/2016   Procedure: RIGHT TYMPANOMASTOIDECTOMY;  Surgeon: Newman Pies, MD;  Location: [facility];  Service: ENT;  Laterality: Right;   Social History   Occupational History  . Occupation: stay at home mom  Tobacco Use  . Smoking status: Current Some Day Smoker    Packs/day: 0.25    Years: 2.00    Pack years: 0.50    Types: Cigarettes  . Smokeless tobacco: Never Used  . Tobacco comment: 3-4 cigs a day  Substance and Sexual Activity  . Alcohol use: No  . Drug use: No  . Sexual activity: Not on file    Comment: only smokes about 2 cigrettes a day/trying to quit

## 2019-08-24 ENCOUNTER — Other Ambulatory Visit: Payer: Medicaid Other

## 2019-08-24 ENCOUNTER — Other Ambulatory Visit: Payer: Self-pay

## 2019-08-24 DIAGNOSIS — E8881 Metabolic syndrome: Secondary | ICD-10-CM | POA: Diagnosis not present

## 2019-08-24 DIAGNOSIS — Z6841 Body Mass Index (BMI) 40.0 and over, adult: Secondary | ICD-10-CM | POA: Diagnosis not present

## 2019-08-24 DIAGNOSIS — D649 Anemia, unspecified: Secondary | ICD-10-CM | POA: Diagnosis not present

## 2019-08-24 DIAGNOSIS — E039 Hypothyroidism, unspecified: Secondary | ICD-10-CM

## 2019-08-24 LAB — BAYER DCA HB A1C WAIVED: HB A1C (BAYER DCA - WAIVED): 5.2 % (ref <7.0)

## 2019-08-25 LAB — THYROID PANEL WITH TSH
Free Thyroxine Index: 1.7 (ref 1.2–4.9)
T3 Uptake Ratio: 24 % (ref 24–39)
T4, Total: 7.2 ug/dL (ref 4.5–12.0)
TSH: 2.04 u[IU]/mL (ref 0.450–4.500)

## 2019-08-25 LAB — LIPID PANEL
Chol/HDL Ratio: 5.5 ratio — ABNORMAL HIGH (ref 0.0–4.4)
Cholesterol, Total: 311 mg/dL — ABNORMAL HIGH (ref 100–199)
HDL: 57 mg/dL (ref >39)
LDL Chol Calc (NIH): 210 mg/dL — ABNORMAL HIGH (ref 0–99)
Triglycerides: 226 mg/dL — ABNORMAL HIGH (ref 0–149)
VLDL Cholesterol Cal: 44 mg/dL — ABNORMAL HIGH (ref 5–40)

## 2019-08-25 LAB — CBC
Hematocrit: 35.6 % (ref 34.0–46.6)
Hemoglobin: 11.2 g/dL (ref 11.1–15.9)
MCH: 23.6 pg — ABNORMAL LOW (ref 26.6–33.0)
MCHC: 31.5 g/dL (ref 31.5–35.7)
MCV: 75 fL — ABNORMAL LOW (ref 79–97)
Platelets: 337 10*3/uL (ref 150–450)
RBC: 4.74 x10E6/uL (ref 3.77–5.28)
RDW: 15.4 % (ref 11.7–15.4)
WBC: 16.1 10*3/uL — ABNORMAL HIGH (ref 3.4–10.8)

## 2019-08-28 ENCOUNTER — Other Ambulatory Visit: Payer: Self-pay | Admitting: Family Medicine

## 2019-08-28 MED ORDER — ATORVASTATIN CALCIUM 20 MG PO TABS: 20.0000 mg | ORAL_TABLET | Freq: Every day | ORAL | 3 refills | Status: DC

## 2019-08-29 ENCOUNTER — Encounter: Payer: Self-pay | Admitting: Family Medicine

## 2019-08-31 ENCOUNTER — Other Ambulatory Visit: Payer: Self-pay | Admitting: Family Medicine

## 2019-08-31 DIAGNOSIS — K219 Gastro-esophageal reflux disease without esophagitis: Secondary | ICD-10-CM

## 2019-09-12 ENCOUNTER — Ambulatory Visit: Payer: Medicaid Other | Admitting: Family Medicine

## 2019-10-03 ENCOUNTER — Other Ambulatory Visit: Payer: Self-pay | Admitting: Family Medicine

## 2019-10-03 NOTE — Telephone Encounter (Signed)
LMOVM refills were sent in in September, these maybe on file at [facility] to ask for these by name instead of an Rx number, they may have been assigned a new number

## 2019-10-03 NOTE — Telephone Encounter (Signed)
What is the name of the medication? Fluoxetine 20 mg, Pantoprazole 40 mg Patient is out and had appt 9-1 for med check  Have you contacted your pharmacy to request a refill? Yes  Which pharmacy would you like this sent to? [facility]   Patient notified that their request is being sent to the clinical staff for review and that they should receive a call once it is complete. If they do not receive a call within 24 hours they can check with their pharmacy or our office.

## 2019-11-01 ENCOUNTER — Other Ambulatory Visit: Payer: Self-pay | Admitting: Family Medicine

## 2019-11-01 DIAGNOSIS — R197 Diarrhea, unspecified: Secondary | ICD-10-CM

## 2019-11-26 ENCOUNTER — Other Ambulatory Visit: Payer: Self-pay

## 2019-11-26 ENCOUNTER — Ambulatory Visit (INDEPENDENT_AMBULATORY_CARE_PROVIDER_SITE_OTHER): Payer: Medicaid Other | Admitting: Family Medicine

## 2019-11-26 ENCOUNTER — Encounter: Payer: Self-pay | Admitting: Family Medicine

## 2019-11-26 DIAGNOSIS — F330 Major depressive disorder, recurrent, mild: Secondary | ICD-10-CM | POA: Diagnosis not present

## 2019-11-26 DIAGNOSIS — F411 Generalized anxiety disorder: Secondary | ICD-10-CM

## 2019-11-26 MED ORDER — FLUOXETINE HCL 40 MG PO CAPS: 40.0000 mg | ORAL_CAPSULE | Freq: Every day | ORAL | 1 refills | Status: DC

## 2019-11-26 NOTE — Progress Notes (Signed)
Virtual Visit via telephone Note  I connected with Amber Colon on 11/26/19 at 1718 by telephone and verified that I am speaking with the correct person using two identifiers. Amber Colon is currently located at home and no other people are currently with her during visit. The provider, Fransisca Kaufmann Dettinger, MD is located in their office at time of visit.  Call ended at 53  I discussed the limitations, risks, security and privacy concerns of performing an evaluation and management service by telephone and the availability of in person appointments. I also discussed with the patient that there may be a patient responsible charge related to this service. The patient expressed understanding and agreed to proceed.   History and Present Illness: Patient is calling in with headaches that have been bad and decreased energy and feels like crap.  She doesn't feel like eating and she feels really depressed lately and she has no motivation.  She has headaches on the left side of her head and she has decreased concentration and decreased memory and focus is decreased.  She feels like her new job has increased stress. She worked there just recently and that is when the stress started around the same time.  She has felt like this for 1 month.   No diagnosis found.  Outpatient Encounter Medications as of 11/26/2019  Medication Sig  . aspirin EC 81 MG tablet Take 81 mg by mouth daily as needed (for chest pain).  Marland Kitchen atorvastatin (LIPITOR) 20 MG tablet Take 1 tablet (20 mg total) by mouth daily.  . colestipol (COLESTID) 1 g tablet Take 2 tablets by mouth twice daily  . EUTHYROX 75 MCG tablet TAKE 1 TABLET BY MOUTH ONCE DAILY. NEED REPEAT LABS  . FLUoxetine (PROZAC) 20 MG capsule Take 1 capsule (20 mg total) by mouth daily.  . pantoprazole (PROTONIX) 40 MG tablet Take 1 tablet by mouth once daily  . tiZANidine (ZANAFLEX) 4 MG tablet Take 0.5-1 tablets (2-4 mg total) by mouth every 8 (eight) hours as  needed for muscle spasms.   No facility-administered encounter medications on file as of 11/26/2019.     Review of Systems  Constitutional: Positive for activity change, appetite change and fatigue. Negative for chills and fever.  Eyes: Negative for visual disturbance.  Respiratory: Negative for chest tightness and shortness of breath.   Cardiovascular: Negative for chest pain and leg swelling.  Musculoskeletal: Negative for back pain and gait problem.  Skin: Negative for rash.  Neurological: Positive for headaches. Negative for speech difficulty, weakness and light-headedness.  Psychiatric/Behavioral: Positive for decreased concentration, dysphoric mood and sleep disturbance. Negative for agitation, behavioral problems, self-injury and suicidal ideas. The patient is nervous/anxious.   All other systems reviewed and are negative.   Observations/Objective: Patient sounds comfortable and in no acute distress  Assessment and Plan: Problem List Items Addressed This Visit      Other   GAD (generalized anxiety disorder)   Relevant Medications   FLUoxetine (PROZAC) 40 MG capsule   Depression   Relevant Medications   FLUoxetine (PROZAC) 40 MG capsule      Sounds like patient's headaches and a lot of her symptoms were stress-induced and started after her stress increased and she says her depression has been much increased since we will increase her antidepression medication to 40 mg and have her follow-up in a month Follow Up Instructions: Follow up 1 month for Pap smear and depression    I discussed the assessment and treatment  plan with the patient. The patient was provided an opportunity to ask questions and all were answered. The patient agreed with the plan and demonstrated an understanding of the instructions.   The patient was advised to call back or seek an in-person evaluation if the symptoms worsen or if the condition fails to improve as anticipated.  The above assessment and  management plan was discussed with the patient. The patient verbalized understanding of and has agreed to the management plan. Patient is aware to call the clinic if symptoms persist or worsen. Patient is aware when to return to the clinic for a follow-up visit. Patient educated on when it is appropriate to go to the emergency department.    I provided 10 minutes of non-face-to-face time during this encounter.    Nils Pyle, MD

## 2019-12-31 ENCOUNTER — Other Ambulatory Visit: Payer: Self-pay | Admitting: Family Medicine

## 2019-12-31 ENCOUNTER — Encounter: Payer: Self-pay | Admitting: Family Medicine

## 2019-12-31 ENCOUNTER — Ambulatory Visit (INDEPENDENT_AMBULATORY_CARE_PROVIDER_SITE_OTHER): Payer: Medicaid Other | Admitting: Family Medicine

## 2019-12-31 ENCOUNTER — Other Ambulatory Visit: Payer: Self-pay

## 2019-12-31 VITALS — BP 124/86 | HR 81 | Temp 98.2°F | Ht 60.0 in | Wt 374.0 lb

## 2019-12-31 DIAGNOSIS — B372 Candidiasis of skin and nail: Secondary | ICD-10-CM

## 2019-12-31 DIAGNOSIS — Z720 Tobacco use: Secondary | ICD-10-CM | POA: Diagnosis not present

## 2019-12-31 DIAGNOSIS — B9689 Other specified bacterial agents as the cause of diseases classified elsewhere: Secondary | ICD-10-CM

## 2019-12-31 DIAGNOSIS — F330 Major depressive disorder, recurrent, mild: Secondary | ICD-10-CM

## 2019-12-31 DIAGNOSIS — E8881 Metabolic syndrome: Secondary | ICD-10-CM

## 2019-12-31 DIAGNOSIS — Z124 Encounter for screening for malignant neoplasm of cervix: Secondary | ICD-10-CM

## 2019-12-31 DIAGNOSIS — Z6841 Body Mass Index (BMI) 40.0 and over, adult: Secondary | ICD-10-CM

## 2019-12-31 DIAGNOSIS — Z23 Encounter for immunization: Secondary | ICD-10-CM | POA: Diagnosis not present

## 2019-12-31 DIAGNOSIS — N6314 Unspecified lump in the right breast, lower inner quadrant: Secondary | ICD-10-CM

## 2019-12-31 DIAGNOSIS — E039 Hypothyroidism, unspecified: Secondary | ICD-10-CM | POA: Diagnosis not present

## 2019-12-31 DIAGNOSIS — N898 Other specified noninflammatory disorders of vagina: Secondary | ICD-10-CM

## 2019-12-31 DIAGNOSIS — I10 Essential (primary) hypertension: Secondary | ICD-10-CM

## 2019-12-31 DIAGNOSIS — F411 Generalized anxiety disorder: Secondary | ICD-10-CM

## 2019-12-31 DIAGNOSIS — D72829 Elevated white blood cell count, unspecified: Secondary | ICD-10-CM

## 2019-12-31 DIAGNOSIS — Z01419 Encounter for gynecological examination (general) (routine) without abnormal findings: Secondary | ICD-10-CM | POA: Diagnosis not present

## 2019-12-31 LAB — WET PREP FOR TRICH, YEAST, CLUE
Clue Cell Exam: POSITIVE — AB
Trichomonas Exam: NEGATIVE
Yeast Exam: NEGATIVE

## 2019-12-31 LAB — BAYER DCA HB A1C WAIVED: HB A1C (BAYER DCA - WAIVED): 5.4 % (ref <7.0)

## 2019-12-31 MED ORDER — NYSTATIN 100000 UNIT/GM EX CREA: 1.0000 "application " | TOPICAL_CREAM | Freq: Two times a day (BID) | CUTANEOUS | 0 refills | Status: DC

## 2019-12-31 MED ORDER — METRONIDAZOLE 500 MG PO TABS: 500.0000 mg | ORAL_TABLET | Freq: Two times a day (BID) | ORAL | 0 refills | Status: DC

## 2019-12-31 MED ORDER — FLUOXETINE HCL 40 MG PO CAPS: 40.0000 mg | ORAL_CAPSULE | Freq: Every day | ORAL | 5 refills | Status: DC

## 2019-12-31 NOTE — Patient Instructions (Addendum)
You had labs performed today.  You will be contacted with the results of the labs once they are available, usually in the next 3 business days for routine lab work.  If you have an active my chart account, they will be released to your MyChart.  If you prefer to have these labs released to you via telephone, please let us know.  If you had a pap smear or biopsy performed, expect to be contacted in about 7-10 days.  I have ordered imaging of your breast to look at the lump on the right.  Intertrigo Intertrigo is skin irritation (inflammation) that happens in warm, moist areas of the body. The irritation can cause a rash and make skin raw and itchy. The rash is usually pink or red. It happens mostly between folds of skin or where skin rubs together, such as:  Between the toes.  In the armpits.  In the groin area.  Under the belly.  Under the breasts.  Around the butt area. This condition is not passed from person to person (is not contagious). What are the causes?  Heat, moisture, rubbing, and not enough air movement.  The condition can be made worse by: ? Sweat. ? Bacteria. ? A fungus, such as yeast. What increases the risk?  Moisture in your skin folds.  You are more likely to develop this condition if you: ? Have diabetes. ? Are overweight. ? Are not able to move around. ? Live in a warm and moist climate. ? Wear splints, braces, or other medical devices. ? Are not able to control your pee (urine) or poop (stool). What are the signs or symptoms?  A pink or red skin rash in the skin fold or near the skin fold.  Raw or scaly skin.  Itching.  A burning feeling.  Bleeding.  Leaking fluid.  A bad smell. How is this treated?  Cleaning and drying your skin.  Taking an antibiotic medicine or using an antibiotic skin cream for a bacterial infection.  Using an antifungal cream on your skin or taking pills for an infection that was caused by a fungus, such as  yeast.  Using a steroid ointment to stop the itching and irritation.  Separating the skin fold with a clean cotton cloth to absorb moisture and allow air to flow into the area. Follow these instructions at home:  Keep the affected area clean and dry.  Do not scratch your skin.  Stay cool as much as you can. Use an air conditioner or a fan, if you have one.  Apply over-the-counter and prescription medicines only as told by your doctor.  If you were prescribed an antibiotic medicine, use it as told by your doctor. Do not stop using the antibiotic even if your condition starts to get better.  Keep all follow-up visits as told by your doctor. This is important. How is this prevented?   Stay at a healthy weight.  Take care of your feet. This is very important if you have diabetes. You should: ? Wear shoes that fit well. ? Keep your feet dry. ? Wear clean cotton or wool socks.  Protect the skin in your groin and butt area as told by your doctor. To do this: ? Follow a regular cleaning routine. ? Use creams, powders, or ointments that protect your skin. ? Change protection pads often.  Do not wear tight clothes. Wear clothes that: ? Are loose. ? Take moisture away from your body. ? Are made of  cotton.  Wear a bra that gives good support, if needed.  Shower and dry yourself well after being active. Use a hair dryer on a cool setting to dry between skin folds.  Keep your blood sugar under control if you have diabetes. Contact a doctor if:  Your symptoms do not get better with treatment.  Your symptoms get worse or they spread.  You notice more redness and warmth.  You have a fever. Summary  Intertrigo is skin irritation that occurs when folds of skin rub together.  This condition is caused by heat, moisture, and rubbing.  This condition may be treated by cleaning and drying your skin and with medicines.  Apply over-the-counter and prescription medicines only as told  by your doctor.  Keep all follow-up visits as told by your doctor. This is important. This information is not intended to replace advice given to you by your health care provider. Make sure you discuss any questions you have with your health care provider. Document Revised: 09/14/2018 Document Reviewed: 09/14/2018 Elsevier Patient Education  2020 [place] 37-40 Years Old, Female Preventive care refers to visits with your health care provider and lifestyle choices that can promote health and wellness. This includes:  A yearly physical exam. This may also be called an annual well check.  Regular dental visits and eye exams.  Immunizations.  Screening for certain conditions.  Healthy lifestyle choices, such as eating a healthy diet, getting regular exercise, not using drugs or products that contain nicotine and tobacco, and limiting alcohol use. What can I expect for my preventive care visit? Physical exam Your health care provider will check your:  Height and weight. This may be used to calculate body mass index (BMI), which tells if you are at a healthy weight.  Heart rate and blood pressure.  Skin for abnormal spots. Counseling Your health care provider may ask you questions about your:  Alcohol, tobacco, and drug use.  Emotional well-being.  Home and relationship well-being.  Sexual activity.  Eating habits.  Work and work Statistician.  Method of birth control.  Menstrual cycle.  Pregnancy history. What immunizations do I need?  Influenza (flu) vaccine  This is recommended every year. Tetanus, diphtheria, and pertussis (Tdap) vaccine  You may need a Td booster every 10 years. Varicella (chickenpox) vaccine  You may need this if you have not been vaccinated. Human papillomavirus (HPV) vaccine  If recommended by your health care provider, you may need three doses over 6 months. Measles, mumps, and rubella (MMR) vaccine  You may  need at least one dose of MMR. You may also need a second dose. Meningococcal conjugate (MenACWY) vaccine  One dose is recommended if you are age 25-21 years and a first-year college student living in a residence hall, or if you have one of several medical conditions. You may also need additional booster doses. Pneumococcal conjugate (PCV13) vaccine  You may need this if you have certain conditions and were not previously vaccinated. Pneumococcal polysaccharide (PPSV23) vaccine  You may need one or two doses if you smoke cigarettes or if you have certain conditions. Hepatitis A vaccine  You may need this if you have certain conditions or if you travel or work in places where you may be exposed to hepatitis A. Hepatitis B vaccine  You may need this if you have certain conditions or if you travel or work in places where you may be exposed to hepatitis B. Haemophilus influenzae type b (Hib)  vaccine  You may need this if you have certain conditions. You may receive vaccines as individual doses or as more than one vaccine together in one shot (combination vaccines). Talk with your health care provider about the risks and benefits of combination vaccines. What tests do I need?  Blood tests  Lipid and cholesterol levels. These may be checked every 5 years starting at age 8.  Hepatitis C test.  Hepatitis B test. Screening  Diabetes screening. This is done by checking your blood sugar (glucose) after you have not eaten for a while (fasting).  Sexually transmitted disease (STD) testing.  BRCA-related cancer screening. This may be done if you have a family history of breast, ovarian, tubal, or peritoneal cancers.  Pelvic exam and Pap test. This may be done every 3 years starting at age 77. Starting at age 39, this may be done every 5 years if you have a Pap test in combination with an HPV test. Talk with your health care provider about your test results, treatment options, and if  necessary, the need for more tests. Follow these instructions at home: Eating and drinking   Eat a diet that includes fresh fruits and vegetables, whole grains, lean protein, and low-fat dairy.  Take vitamin and mineral supplements as recommended by your health care provider.  Do not drink alcohol if: ? Your health care provider tells you not to drink. ? You are pregnant, may be pregnant, or are planning to become pregnant.  If you drink alcohol: ? Limit how much you have to 0-1 drink a day. ? Be aware of how much alcohol is in your drink. In the [place] one drink equals one 12 oz bottle of beer (355 mL), one 5 oz glass of wine (148 mL), or one 1 oz glass of hard liquor (44 mL). Lifestyle  Take daily care of your teeth and gums.  Stay active. Exercise for at least 30 minutes on 5 or more days each week.  Do not use any products that contain nicotine or tobacco, such as cigarettes, e-cigarettes, and chewing tobacco. If you need help quitting, ask your health care provider.  If you are sexually active, practice safe sex. Use a condom or other form of birth control (contraception) in order to prevent pregnancy and STIs (sexually transmitted infections). If you plan to become pregnant, see your health care provider for a preconception visit. What's next?  Visit your health care provider once a year for a well check visit.  Ask your health care provider how often you should have your eyes and teeth checked.  Stay up to date on all vaccines. This information is not intended to replace advice given to you by your health care provider. Make sure you discuss any questions you have with your health care provider. Document Revised: 08/17/2018 Document Reviewed: 08/17/2018 Elsevier Patient Education  2020 [employer].

## 2019-12-31 NOTE — Progress Notes (Signed)
Amber Colon is a 39 y.o. female presents to office today for annual physical exam examination.    Concerns today include: 1.  Vaginal discharge/itching Patient reports about a month history of vaginal discharge and itching.  She notes a bread-like odor to the discharge.  She tried Vagisil powder but this was not helpful.  2.  Right breast lump Patient reports a right breast lump that was present 3 months ago.  She notes it was on the outer aspect of the breast and has since resolved.  She has painful lump sometimes in the left breast but again this has resolved.  She notes various abscesses that occur throughout the breast intermittently.  She does not have any currently active ones.  She does note family history of breast cancer in her mother at age 50.  62.  Hypothyroidism Patient reports compliance with Euthyrox 75 mcg daily.  She does report difficulty with weight gain but notes that she is started a new job in which she is more physically active.  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  She does not report any change in bowel habits but does note intermittent heart palpitations that occur at rest.  She reports sweating.  Occupation: Radiographer, therapeutic, Marital status: Married, Substance use: Smokes daily Diet: " Country food", Exercise: No structured. Last mammogram: n/a Last pap smear: ? Immunizations needed:  Flu shot  Past Medical History:  Diagnosis Date  . Anemia    not taking her iron pills either  . Anxiety   . Depression   . Gallstones   . GERD (gastroesophageal reflux disease)   . Headache    "occasional"  . History of blood transfusion    blood after miscarrige  . Hypertension    doesn't take her bp pills  . Hypothyroidism    again, takes no med  . Morbid obesity ([place]) 01/16/2019  . Obesity   . Panic attack    Social History   Socioeconomic History  . Marital status: Married    Spouse name: Not on file  . Number of  children: 3  . Years of education: Not on file  . Highest education level: Not on file  Occupational History  . Occupation: stay at home mom  Tobacco Use  . Smoking status: Current Some Day Smoker    Packs/day: 0.25    Years: 2.00    Pack years: 0.50    Types: Cigarettes  . Smokeless tobacco: Never Used  . Tobacco comment: 3-4 cigs a day  Substance and Sexual Activity  . Alcohol use: No  . Drug use: No  . Sexual activity: Not on file    Comment: only smokes about 2 cigrettes a day/trying to quit  Other Topics Concern  . Not on file  Social History Narrative  . Not on file   Social Determinants of Health   Financial Resource Strain:   . Difficulty of Paying Living Expenses: Not on file  Food Insecurity:   . Worried About Charity fundraiser in the Last Year: Not on file  . Ran Out of Food in the Last Year: Not on file  Transportation Needs:   . Lack of Transportation (Medical): Not on file  . Lack of Transportation (Non-Medical): Not on file  Physical Activity:   . Days of Exercise per Week: Not on file  . Minutes of Exercise per Session: Not on file  Stress:   . Feeling of Stress :  Not on file  Social Connections:   . Frequency of Communication with Friends and Family: Not on file  . Frequency of Social Gatherings with Friends and Family: Not on file  . Attends Religious Services: Not on file  . Active Member of Clubs or Organizations: Not on file  . Attends Archivist Meetings: Not on file  . Marital Status: Not on file  Intimate Partner Violence:   . Fear of Current or Ex-Partner: Not on file  . Emotionally Abused: Not on file  . Physically Abused: Not on file  . Sexually Abused: Not on file   Past Surgical History:  Procedure Laterality Date  . CHOLECYSTECTOMY N/A 09/08/2016   Procedure: LAPAROSCOPIC CHOLECYSTECTOMY WITH INTRAOPERATIVE CHOLANGIOGRAM;  Surgeon: Autumn Messing III, MD;  Location: [facility];  Service: General;  Laterality: N/A;  . DILATION AND  CURETTAGE OF UTERUS    . ESOPHAGOGASTRODUODENOSCOPY (EGD) WITH PROPOFOL N/A 02/23/2018   Procedure: ESOPHAGOGASTRODUODENOSCOPY (EGD) WITH PROPOFOL;  Surgeon: Milus Banister, MD;  Location: [facility];  Service: Endoscopy;  Laterality: N/A;  . TUBAL LIGATION    . TYMPANOMASTOIDECTOMY Left 12/24/2015   Procedure: LEFT MODIFIED  RADICAL TYMPANOMASTOIDECTOMY;  Surgeon: Leta Baptist, MD;  Location: [facility];  Service: ENT;  Laterality: Left;  . TYMPANOMASTOIDECTOMY Right 07/07/2016   Procedure: RIGHT TYMPANOMASTOIDECTOMY;  Surgeon: Leta Baptist, MD;  Location: [facility];  Service: ENT;  Laterality: Right;   Family History  Problem Relation Age of Onset  . Diabetes Mother   . Heart disease Mother   . Ovarian cancer Mother   . Stroke Mother   . Hypertension Mother   . Diabetes Father   . COPD Father   . Heart disease Father   . Anuerysm Father        Neck, stomach  . Breast cancer Maternal Grandmother   . Diabetes Maternal Grandfather   . Diabetes Paternal Aunt   . Diabetes Paternal Uncle     Current Outpatient Medications:  .  colestipol (COLESTID) 1 g tablet, Take 2 tablets by mouth twice daily, Disp: 120 tablet, Rfl: 2 .  EUTHYROX 75 MCG tablet, TAKE 1 TABLET BY MOUTH ONCE DAILY. NEED REPEAT LABS, Disp: 90 tablet, Rfl: 3 .  FLUoxetine (PROZAC) 40 MG capsule, Take 1 capsule (40 mg total) by mouth daily., Disp: 30 capsule, Rfl: 1 .  pantoprazole (PROTONIX) 40 MG tablet, Take 1 tablet by mouth once daily, Disp: 90 tablet, Rfl: 0  Allergies  Allergen Reactions  . Amoxicillin Diarrhea, Nausea And Vomiting and Other (See Comments)    Has patient had a PCN reaction causing immediate rash, facial/tongue/throat swelling, SOB or lightheadedness with hypotension: No Has patient had a PCN reaction causing severe rash involving mucus membranes or skin necrosis: No Has patient had a PCN reaction that required hospitalization: No Has patient had a PCN reaction occurring within the last 10 years: No If all of the  above answers are "NO", then may proceed with Cephalosporin use.      ROS: Review of Systems Constitutional: negative Eyes: positive for some blurred vision. has not had eye exam in >3 years Ears, nose, mouth, throat, and face: negative Respiratory: positive for dyspnea on exertion Cardiovascular: positive for palpitations Gastrointestinal: negative Genitourinary:positive for vaginal discharge Integument/breast: positive for breast lump and breast tenderness Hematologic/lymphatic: negative Musculoskeletal:positive for right knee pain Neurological: negative Behavioral/Psych: Anxiety and depression but does not want to advance her fluoxetine further today.  Symptoms seem to be situational with stressors from her spouse.  Endocrine: negative Allergic/Immunologic: negative    Physical exam BP 124/86   Pulse 81   Temp 98.2 F (36.8 C) (Temporal)   Ht 5' (1.524 m)   Wt (!) 374 lb (169.6 kg)   SpO2 97%   BMI 73.04 kg/m  General appearance: alert, cooperative, appears stated age and morbidly obese Head: Normocephalic, without obvious abnormality, atraumatic Eyes: negative findings: lids and lashes normal, conjunctivae and sclerae normal, corneas clear and pupils equal, round, reactive to light and accomodation; no exophthalmos Ears: normal TM's and external ear canals both ears Nose: Nares normal. Septum midline. Mucosa normal. No drainage or sinus tenderness. Throat: lips, mucosa, and tongue normal; teeth and gums normal Neck: no adenopathy, supple, symmetrical, trachea midline and thyroid not enlarged, symmetric, no tenderness/mass/nodules Back: symmetric, no curvature. ROM normal. No CVA tenderness. Lungs: clear to auscultation bilaterally Breasts: No nipple retraction or dimpling, No nipple discharge or bleeding, No axillary or supraclavicular adenopathy, There is a palpable, hard, irregularly shaped area of soft tissue that seems fixed to the medial aspect of the right breast at  about the 4 o'clock position.  This is tender to palpation.  No other dominant masses appreciated.  Breasts are pendulous. Heart: regular rate and rhythm, S1, S2 normal, no murmur, click, rub or gallop Abdomen: Exam limited by body habitus.  No apparent hepatosplenomegaly.  No tenderness palpation.  Nondistended. Pelvic: cervix normal in appearance, no adnexal masses or tenderness, no cervical motion tenderness, rectovaginal septum normal, uterus normal size, shape, and consistency, vagina normal without discharge and Cervix is retroverted.  She has some redness and irritation of the external vaginal tissue, specifically the labia majora.  No excoriation or skin breakdown appreciated. Extremities: extremities normal, atraumatic, no cyanosis or edema Pulses: 2+ and symmetric Skin: vaginal skin as above Lymph nodes: Cervical, supraclavicular, and axillary nodes normal. Neurologic: Alert and oriented X 3, normal strength and tone. Normal symmetric reflexes. Normal coordination and gait Psych: Mood stable.  Affect flat.  Eye contact fair.  Depression screen [facility] 2/9 12/31/2019 08/21/2019 06/13/2019  Decreased Interest 1 - 1  Down, Depressed, Hopeless 1 1 0  PHQ - 2 Score _0 Altered sleeping 0 3 3  Tired, decreased energy _1 Change in appetite 1 0 3  Feeling bad or failure about yourself  _2 Trouble concentrating _3 Moving slowly or fidgety/restless 1 0 3  Suicidal thoughts 0 0 0  PHQ-9 Score _4 Difficult doing work/chores Somewhat difficult Somewhat difficult Somewhat difficult  Some recent data might be hidden   GAD 7 : Generalized Anxiety Score 12/31/2019 08/21/2019 06/13/2019 05/07/2019  Nervous, Anxious, on Edge 1 0 0 3  Control/stop worrying 2 3 0 3  Worry too much - different things 2 3 0 3  Trouble relaxing _5 Restless 0 0 3 0  Easily annoyed or irritable _6 Afraid - awful might happen _7 0  Total GAD 7 Score _8 Anxiety Difficulty Somewhat  difficult Somewhat difficult Somewhat difficult Somewhat difficult    Assessment/ Plan: Amber Colon here for annual physical exam.   1. Well woman exam with routine gynecological exam Reinforced lifestyle modification including increased physical activity and adjustment of diet.  Handout was provided today.  Given her BMI, would strongly consider other interventions like bariatric surgery.  If she is interested in this going forward, will place  referral - Pap IG, CT/NG NAA, and HPV (high risk) [facility]  2. Screening for malignant neoplasm of cervix - Pap IG, CT/NG NAA, and HPV (high risk) [facility]  3. Tobacco use Counseling.  Patient is contemplative  4. Morbid obesity with BMI of 70 and over, adult ([place]) As above - CMP14+EGFR - Lipid Panel - Bayer DCA Hb A1c Waived  5. GAD (generalized anxiety disorder) Elevated PHQ and GAD-7 but patient did not wish to advance or add any medications at this time. - FLUoxetine (PROZAC) 40 MG capsule; Take 1 capsule (40 mg total) by mouth daily.  Dispense: 30 capsule; Refill: 5  6. Mild episode of recurrent major depressive disorder (HCC) - FLUoxetine (PROZAC) 40 MG capsule; Take 1 capsule (40 mg total) by mouth daily.  Dispense: 30 capsule; Refill: 5  7. Acquired hypothyroidism She has had about a 8 pound weight gain since her last visit.  We will evaluate for possible metabolic etiology but I suspect this is more to do with lifestyle. - TSH  8. Essential hypertension Controlled - GBT51+VOHY  9. Metabolic syndrome - WVP71+GGYI - Lipid Panel - Bayer DCA Hb A1c Waived  10. Leukocytosis, unspecified type Uncertain etiology.  Possibly reactive due to morbid obesity - CBC  11. Breast lump on right side at 4 o'clock position We discussed this is likely fibrocystic changes versus scar in the setting of previous abscess.  However, given mother's history of early breast cancer will obtain imaging. - MM DIAG BREAST TOMO  BILATERAL; Future - US BREAST COMPLETE UNI RIGHT INC AXILLA; Future  12. Intertriginous candidiasis Topical antifungal provided.  Will await wet prep to determine if need for Diflucan is appropriate. - nystatin cream (MYCOSTATIN); Apply 1 application topically 2 (two) times daily. To the EXTERNAL vagina x10-14 days  Dispense: 45 g; Refill: 0  13. Vaginal discharge - WET PREP FOR TRICH, YEAST, CLUE   Counseled on healthy lifestyle choices, including diet (rich in fruits, vegetables and lean meats and low in salt and simple carbohydrates) and exercise (at least 30 minutes of moderate physical activity daily).  Patient to follow up in 1 year for annual exam or sooner if needed.  Verlan Grotz M. Lajuana Ripple, DO

## 2020-01-01 ENCOUNTER — Other Ambulatory Visit: Payer: Self-pay | Admitting: Family Medicine

## 2020-01-01 DIAGNOSIS — E039 Hypothyroidism, unspecified: Secondary | ICD-10-CM

## 2020-01-01 LAB — CMP14+EGFR
ALT: 28 IU/L (ref 0–32)
AST: 35 IU/L (ref 0–40)
Albumin/Globulin Ratio: 1.6 (ref 1.2–2.2)
Albumin: 4.4 g/dL (ref 3.8–4.8)
Alkaline Phosphatase: 96 IU/L (ref 39–117)
BUN/Creatinine Ratio: 8 — ABNORMAL LOW (ref 9–23)
BUN: 8 mg/dL (ref 6–20)
Bilirubin Total: 0.4 mg/dL (ref 0.0–1.2)
CO2: 21 mmol/L (ref 20–29)
Calcium: 9.6 mg/dL (ref 8.7–10.2)
Chloride: 102 mmol/L (ref 96–106)
Creatinine, Ser: 1.04 mg/dL — ABNORMAL HIGH (ref 0.57–1.00)
GFR calc Af Amer: 79 mL/min/{1.73_m2} (ref >59)
GFR calc non Af Amer: 68 mL/min/{1.73_m2} (ref >59)
Globulin, Total: 2.7 g/dL (ref 1.5–4.5)
Glucose: 96 mg/dL (ref 65–99)
Potassium: 4.1 mmol/L (ref 3.5–5.2)
Sodium: 137 mmol/L (ref 134–144)
Total Protein: 7.1 g/dL (ref 6.0–8.5)

## 2020-01-01 LAB — CBC
Hematocrit: 33.3 % — ABNORMAL LOW (ref 34.0–46.6)
Hemoglobin: 10.3 g/dL — ABNORMAL LOW (ref 11.1–15.9)
MCH: 22.5 pg — ABNORMAL LOW (ref 26.6–33.0)
MCHC: 30.9 g/dL — ABNORMAL LOW (ref 31.5–35.7)
MCV: 73 fL — ABNORMAL LOW (ref 79–97)
Platelets: 226 10*3/uL (ref 150–450)
RBC: 4.58 x10E6/uL (ref 3.77–5.28)
RDW: 16 % — ABNORMAL HIGH (ref 11.7–15.4)
WBC: 7.6 10*3/uL (ref 3.4–10.8)

## 2020-01-01 LAB — LIPID PANEL
Chol/HDL Ratio: 4.3 ratio (ref 0.0–4.4)
Cholesterol, Total: 230 mg/dL — ABNORMAL HIGH (ref 100–199)
HDL: 54 mg/dL (ref >39)
LDL Chol Calc (NIH): 144 mg/dL — ABNORMAL HIGH (ref 0–99)
Triglycerides: 176 mg/dL — ABNORMAL HIGH (ref 0–149)
VLDL Cholesterol Cal: 32 mg/dL (ref 5–40)

## 2020-01-01 LAB — TSH: TSH: 4.63 u[IU]/mL — ABNORMAL HIGH (ref 0.450–4.500)

## 2020-01-01 MED ORDER — LEVOTHYROXINE SODIUM 88 MCG PO TABS: 88.0000 ug | ORAL_TABLET | Freq: Every day | ORAL | 0 refills | Status: DC

## 2020-01-02 ENCOUNTER — Encounter: Payer: Self-pay | Admitting: Family Medicine

## 2020-01-04 ENCOUNTER — Other Ambulatory Visit: Payer: Self-pay | Admitting: *Deleted

## 2020-01-04 ENCOUNTER — Encounter: Payer: Self-pay | Admitting: Family Medicine

## 2020-01-04 DIAGNOSIS — F411 Generalized anxiety disorder: Secondary | ICD-10-CM

## 2020-01-04 DIAGNOSIS — R197 Diarrhea, unspecified: Secondary | ICD-10-CM

## 2020-01-04 DIAGNOSIS — F330 Major depressive disorder, recurrent, mild: Secondary | ICD-10-CM

## 2020-01-04 DIAGNOSIS — K219 Gastro-esophageal reflux disease without esophagitis: Secondary | ICD-10-CM

## 2020-01-04 MED ORDER — PANTOPRAZOLE SODIUM 40 MG PO TBEC: 40.0000 mg | DELAYED_RELEASE_TABLET | Freq: Every day | ORAL | 1 refills | Status: DC

## 2020-01-04 MED ORDER — COLESTIPOL HCL 1 G PO TABS: 2.0000 g | ORAL_TABLET | Freq: Two times a day (BID) | ORAL | 2 refills | Status: AC

## 2020-01-04 MED ORDER — FLUOXETINE HCL 40 MG PO CAPS: 40.0000 mg | ORAL_CAPSULE | Freq: Every day | ORAL | 5 refills | Status: DC

## 2020-01-07 ENCOUNTER — Encounter: Payer: Self-pay | Admitting: Family Medicine

## 2020-01-08 ENCOUNTER — Other Ambulatory Visit: Payer: Self-pay

## 2020-01-08 ENCOUNTER — Ambulatory Visit (INDEPENDENT_AMBULATORY_CARE_PROVIDER_SITE_OTHER): Payer: Medicaid Other | Admitting: Family Medicine

## 2020-01-08 ENCOUNTER — Encounter: Payer: Self-pay | Admitting: Family Medicine

## 2020-01-08 VITALS — BP 107/73 | HR 88 | Temp 97.1°F | Resp 20 | Ht 60.0 in | Wt 374.0 lb

## 2020-01-08 DIAGNOSIS — M25561 Pain in right knee: Secondary | ICD-10-CM | POA: Diagnosis not present

## 2020-01-08 DIAGNOSIS — M1711 Unilateral primary osteoarthritis, right knee: Secondary | ICD-10-CM

## 2020-01-08 DIAGNOSIS — G8929 Other chronic pain: Secondary | ICD-10-CM

## 2020-01-08 MED ORDER — METHYLPREDNISOLONE ACETATE 80 MG/ML IJ SUSP
40.0000 mg | Freq: Once | INTRAMUSCULAR | Status: AC
  Administered 2020-01-08: 40 mg via INTRAMUSCULAR

## 2020-01-08 MED ORDER — DICLOFENAC SODIUM 1 % EX GEL: 2.0000 g | Freq: Four times a day (QID) | CUTANEOUS | 1 refills | Status: DC

## 2020-01-08 MED ORDER — PREDNISONE 20 MG PO TABS: ORAL_TABLET | ORAL | 0 refills | Status: DC

## 2020-01-08 NOTE — Progress Notes (Signed)
Subjective:  Patient ID: Amber Colon, female    DOB: 1981/08/07, 39 y.o.   MRN: [id number]  Patient Care Team: Janora Norlander, DO as PCP - General (Family Medicine)   Chief Complaint:  right knee pain   HPI: Amber Colon is a 39 y.o. female presenting on 01/08/2020 for right knee pain   Right knee pain for several months, worse over the last month. No new injuries. Had joint injection by ortho in September that was not beneficial. States the pain is worse with weight bearing and going up and down stairs.   Knee Pain  The incident occurred more than 1 week ago. There was no injury mechanism. The pain is present in the right knee. The quality of the pain is described as shooting, aching and burning. The pain is at a severity of 5/10. The pain is moderate. The pain has been intermittent since onset. Pertinent negatives include no inability to bear weight, loss of motion, loss of sensation, muscle weakness, numbness or tingling. She reports no foreign bodies present. The symptoms are aggravated by movement, palpation and weight bearing. She has tried acetaminophen and rest for the symptoms. The treatment provided no relief.     Relevant past medical, surgical, family, and social history reviewed and updated as indicated.  Allergies and medications reviewed and updated. Date reviewed: Chart in Epic.   Past Medical History:  Diagnosis Date  . Anemia    not taking her iron pills either  . Anxiety   . Depression   . Gallstones   . GERD (gastroesophageal reflux disease)   . Headache    "occasional"  . History of blood transfusion    blood after miscarrige  . Hypertension    doesn't take her bp pills  . Hypothyroidism    again, takes no med  . Morbid obesity ([place]) 01/16/2019  . Obesity   . Panic attack     Past Surgical History:  Procedure Laterality Date  . CHOLECYSTECTOMY N/A 09/08/2016   Procedure: LAPAROSCOPIC CHOLECYSTECTOMY WITH INTRAOPERATIVE CHOLANGIOGRAM;   Surgeon: Autumn Messing III, MD;  Location: [place];  Service: General;  Laterality: N/A;  . DILATION AND CURETTAGE OF UTERUS    . ESOPHAGOGASTRODUODENOSCOPY (EGD) WITH PROPOFOL N/A 02/23/2018   Procedure: ESOPHAGOGASTRODUODENOSCOPY (EGD) WITH PROPOFOL;  Surgeon: Milus Banister, MD;  Location: [facility];  Service: Endoscopy;  Laterality: N/A;  . TUBAL LIGATION    . TYMPANOMASTOIDECTOMY Left 12/24/2015   Procedure: LEFT MODIFIED  RADICAL TYMPANOMASTOIDECTOMY;  Surgeon: Leta Baptist, MD;  Location: [place];  Service: ENT;  Laterality: Left;  . TYMPANOMASTOIDECTOMY Right 07/07/2016   Procedure: RIGHT TYMPANOMASTOIDECTOMY;  Surgeon: Leta Baptist, MD;  Location: [facility];  Service: ENT;  Laterality: Right;    Social History   Socioeconomic History  . Marital status: Married    Spouse name: Not on file  . Number of children: 3  . Years of education: Not on file  . Highest education level: Not on file  Occupational History  . Occupation: stay at home mom  Tobacco Use  . Smoking status: Current Some Day Smoker    Packs/day: 0.25    Years: 11.00    Pack years: 2.75    Types: Cigarettes  . Smokeless tobacco: Never Used  . Tobacco comment: 3-4 cigs a day  Substance and Sexual Activity  . Alcohol use: No  . Drug use: No  . Sexual activity: Not on file    Comment: only smokes about  2 cigrettes a day/trying to quit  Other Topics Concern  . Not on file  Social History Narrative  . Not on file   Social Determinants of Health   Financial Resource Strain:   . Difficulty of Paying Living Expenses: Not on file  Food Insecurity:   . Worried About Charity fundraiser in the Last Year: Not on file  . Ran Out of Food in the Last Year: Not on file  Transportation Needs:   . Lack of Transportation (Medical): Not on file  . Lack of Transportation (Non-Medical): Not on file  Physical Activity:   . Days of Exercise per Week: Not on file  . Minutes of Exercise per Session: Not on file  Stress:   . Feeling of Stress  : Not on file  Social Connections:   . Frequency of Communication with Friends and Family: Not on file  . Frequency of Social Gatherings with Friends and Family: Not on file  . Attends Religious Services: Not on file  . Active Member of Clubs or Organizations: Not on file  . Attends Archivist Meetings: Not on file  . Marital Status: Not on file  Intimate Partner Violence:   . Fear of Current or Ex-Partner: Not on file  . Emotionally Abused: Not on file  . Physically Abused: Not on file  . Sexually Abused: Not on file    Outpatient Encounter Medications as of 01/08/2020  Medication Sig  . colestipol (COLESTID) 1 g tablet Take 2 tablets (2 g total) by mouth 2 (two) times daily.  Marland Kitchen FLUoxetine (PROZAC) 40 MG capsule Take 1 capsule (40 mg total) by mouth daily.  Marland Kitchen levothyroxine (SYNTHROID) 88 MCG tablet Take 1 tablet (88 mcg total) by mouth daily.  . metroNIDAZOLE (FLAGYL) 500 MG tablet Take 1 tablet (500 mg total) by mouth 2 (two) times daily.  Marland Kitchen nystatin cream (MYCOSTATIN) Apply 1 application topically 2 (two) times daily. To the EXTERNAL vagina x10-14 days  . pantoprazole (PROTONIX) 40 MG tablet Take 1 tablet (40 mg total) by mouth daily.  . diclofenac Sodium (VOLTAREN) 1 % GEL Apply 2 g topically 4 (four) times daily.  . predniSONE (DELTASONE) 20 MG tablet 2 po at sametime daily for 5 days  . [EXPIRED] methylPREDNISolone acetate (DEPO-MEDROL) injection 40 mg    No facility-administered encounter medications on file as of 01/08/2020.    Allergies  Allergen Reactions  . Amoxicillin Diarrhea, Nausea And Vomiting and Other (See Comments)    Has patient had a PCN reaction causing immediate rash, facial/tongue/throat swelling, SOB or lightheadedness with hypotension: No Has patient had a PCN reaction causing severe rash involving mucus membranes or skin necrosis: No Has patient had a PCN reaction that required hospitalization: No Has patient had a PCN reaction occurring within  the last 10 years: No If all of the above answers are "NO", then may proceed with Cephalosporin use.     Review of Systems  Constitutional: Negative for activity change, appetite change, chills, fatigue and fever.  HENT: Negative.   Eyes: Negative.   Respiratory: Negative for cough, chest tightness and shortness of breath.   Cardiovascular: Negative for chest pain, palpitations and leg swelling.  Gastrointestinal: Negative for abdominal pain, blood in stool, constipation, diarrhea, nausea and vomiting.  Endocrine: Negative.   Genitourinary: Negative for decreased urine volume, difficulty urinating, dysuria, frequency and urgency.  Musculoskeletal: Positive for arthralgias, gait problem (due to right knee pain) and joint swelling. Negative for back pain and myalgias.  Skin: Negative.   Allergic/Immunologic: Negative.   Neurological: Negative for dizziness, tingling, numbness and headaches.  Hematological: Negative.   Psychiatric/Behavioral: Negative for confusion, hallucinations, sleep disturbance and suicidal ideas.  All other systems reviewed and are negative.       Objective:  BP 107/73   Pulse 88   Temp (!) 97.1 F (36.2 C)   Resp 20   Ht 5' (1.524 m)   Wt (!) 374 lb (169.6 kg)   LMP 12/09/2019   SpO2 100%   BMI 73.04 kg/m    Wt Readings from Last 3 Encounters:  01/08/20 (!) 374 lb (169.6 kg)  12/31/19 (!) 374 lb (169.6 kg)  08/23/19 (!) 374 lb (169.6 kg)    Physical Exam Vitals and nursing note reviewed.  Constitutional:      General: She is not in acute distress.    Appearance: Normal appearance. She is well-developed and well-groomed. She is morbidly obese. She is not ill-appearing, toxic-appearing or diaphoretic.  HENT:     Head: Normocephalic and atraumatic.     Jaw: There is normal jaw occlusion.     Right Ear: Hearing normal.     Left Ear: Hearing normal.     Nose: Nose normal.     Mouth/Throat:     Lips: Pink.     Mouth: Mucous membranes are moist.       Pharynx: Oropharynx is clear. Uvula midline.  Eyes:     General: Lids are normal.     Extraocular Movements: Extraocular movements intact.     Conjunctiva/sclera: Conjunctivae normal.     Pupils: Pupils are equal, round, and reactive to light.  Neck:     Thyroid: No thyroid mass, thyromegaly or thyroid tenderness.     Vascular: No carotid bruit or JVD.     Trachea: Trachea and phonation normal.  Cardiovascular:     Rate and Rhythm: Normal rate and regular rhythm.     Chest Wall: PMI is not displaced.     Pulses: Normal pulses.     Heart sounds: Normal heart sounds. No murmur. No friction rub. No gallop.   Pulmonary:     Effort: Pulmonary effort is normal. No respiratory distress.     Breath sounds: Normal breath sounds. No wheezing.  Abdominal:     General: Bowel sounds are normal. There is no distension or abdominal bruit.     Palpations: Abdomen is soft. There is no hepatomegaly or splenomegaly.     Tenderness: There is no abdominal tenderness. There is no right CVA tenderness or left CVA tenderness.     Hernia: No hernia is present.  Musculoskeletal:     Cervical back: Normal range of motion and neck supple.     Right hip: Normal.     Left hip: Normal.     Right upper leg: Normal.     Left upper leg: Normal.     Right knee: Swelling present. No deformity, effusion, erythema, ecchymosis, lacerations, bony tenderness or crepitus. Decreased range of motion (flexion due to body habitus and pain). Tenderness present over the medial joint line and lateral joint line. No LCL laxity, MCL laxity, ACL laxity or PCL laxity. Normal alignment, normal meniscus and normal patellar mobility. Normal pulse.     Right lower leg: Normal.     Left lower leg: No edema.     Right ankle: Normal.  Lymphadenopathy:     Cervical: No cervical adenopathy.  Skin:    General: Skin is warm and dry.  Capillary Refill: Capillary refill takes less than 2 seconds.     Coloration: Skin is not cyanotic,  jaundiced or pale.     Findings: No rash.  Neurological:     General: No focal deficit present.     Mental Status: She is alert and oriented to person, place, and time.     Cranial Nerves: Cranial nerves are intact.     Sensory: Sensation is intact.     Motor: Motor function is intact.     Coordination: Coordination is intact.     Gait: Gait is intact.     Deep Tendon Reflexes: Reflexes are normal and symmetric.  Psychiatric:        Attention and Perception: Attention and perception normal.        Mood and Affect: Mood and affect normal.        Speech: Speech normal.        Behavior: Behavior normal. Behavior is cooperative.        Thought Content: Thought content normal.        Cognition and Memory: Cognition and memory normal.        Judgment: Judgment normal.     Results for orders placed or performed in visit on 12/31/19  WET PREP FOR [place], YEAST, CLUE   Specimen: Vaginal Fluid   VAGINAL FLUI  Result Value Ref Range   Trichomonas Exam Negative Negative   Yeast Exam Negative Negative   Clue Cell Exam Positive (A) Negative  CMP14+EGFR  Result Value Ref Range   Glucose 96 65 - 99 mg/dL   BUN 8 6 - 20 mg/dL   Creatinine, Ser 1.04 (H) 0.57 - 1.00 mg/dL   GFR calc non Af Amer 68 >59 mL/min/1.73   GFR calc Af Amer 79 >59 mL/min/1.73   BUN/Creatinine Ratio 8 (L) 9 - 23   Sodium 137 134 - 144 mmol/L   Potassium 4.1 3.5 - 5.2 mmol/L   Chloride 102 96 - 106 mmol/L   CO2 21 20 - 29 mmol/L   Calcium 9.6 8.7 - 10.2 mg/dL   Total Protein 7.1 6.0 - 8.5 g/dL   Albumin 4.4 3.8 - 4.8 g/dL   Globulin, Total 2.7 1.5 - 4.5 g/dL   Albumin/Globulin Ratio 1.6 1.2 - 2.2   Bilirubin Total 0.4 0.0 - 1.2 mg/dL   Alkaline Phosphatase 96 39 - 117 IU/L   AST 35 0 - 40 IU/L   ALT 28 0 - 32 IU/L  Lipid Panel  Result Value Ref Range   Cholesterol, Total 230 (H) 100 - 199 mg/dL   Triglycerides 176 (H) 0 - 149 mg/dL   HDL 54 >39 mg/dL   VLDL Cholesterol Cal 32 5 - 40 mg/dL   LDL Chol Calc  (NIH) 144 (H) 0 - 99 mg/dL   Chol/HDL Ratio 4.3 0.0 - 4.4 ratio  CBC  Result Value Ref Range   WBC 7.6 3.4 - 10.8 x10E3/uL   RBC 4.58 3.77 - 5.28 x10E6/uL   Hemoglobin 10.3 (L) 11.1 - 15.9 g/dL   Hematocrit 33.3 (L) 34.0 - 46.6 %   MCV 73 (L) 79 - 97 fL   MCH 22.5 (L) 26.6 - 33.0 pg   MCHC 30.9 (L) 31.5 - 35.7 g/dL   RDW 16.0 (H) 11.7 - 15.4 %   Platelets 226 150 - 450 x10E3/uL  TSH  Result Value Ref Range   TSH 4.630 (H) 0.450 - 4.500 uIU/mL  Bayer DCA Hb A1c Waived  Result Value Ref  Range   HB A1C (BAYER DCA - WAIVED) 5.4 <7.0 %       Pertinent labs & imaging results that were available during my care of the patient were reviewed by me and considered in my medical decision making.  Assessment & Plan:  Amber Colon was seen today for right knee pain.  Diagnoses and all orders for this visit:  Chronic pain of right knee Primary osteoarthritis of right knee Ongoing and worsening right knee pain. Imaging in September significant for arthritic changes.  Will burst with steroids and trial topical Voltaren gel. Referral to PT placed and knee brace ordered. Symptomatic care discussed in detail.  -     Ambulatory referral to Physical Therapy -     diclofenac Sodium (VOLTAREN) 1 % GEL; Apply 2 g topically 4 (four) times daily. -     predniSONE (DELTASONE) 20 MG tablet; 2 po at sametime daily for 5 days -     For home use only DME Other see comment     Continue all other maintenance medications.  Follow up plan: Return if symptoms worsen or fail to improve.  Continue healthy lifestyle choices, including diet (rich in fruits, vegetables, and lean proteins, and low in salt and simple carbohydrates) and exercise (at least 30 minutes of moderate physical activity daily).  Educational handout given for knee pain  The above assessment and management plan was discussed with the patient. The patient verbalized understanding of and has agreed to the management plan. Patient is aware to call  the clinic if they develop any new symptoms or if symptoms persist or worsen. Patient is aware when to return to the clinic for a follow-up visit. Patient educated on when it is appropriate to go to the emergency department.   Monia Pouch, FNP-C [facility] [phone]

## 2020-01-08 NOTE — Patient Instructions (Signed)

## 2020-01-10 ENCOUNTER — Encounter: Payer: Self-pay | Admitting: Family Medicine

## 2020-01-10 ENCOUNTER — Ambulatory Visit: Payer: Medicaid Other | Attending: Family Medicine | Admitting: Physical Therapy

## 2020-01-11 ENCOUNTER — Encounter: Payer: Self-pay | Admitting: Family Medicine

## 2020-01-12 LAB — IGP,CTNG,APTIMAHPV
Chlamydia, Nuc. Acid Amp: NEGATIVE
Gonococcus by Nucleic Acid Amp: NEGATIVE
HPV Aptima: NEGATIVE

## 2020-01-14 ENCOUNTER — Encounter: Payer: Self-pay | Admitting: Family Medicine

## 2020-01-15 ENCOUNTER — Other Ambulatory Visit: Payer: Medicaid Other

## 2020-01-16 ENCOUNTER — Ambulatory Visit
Admission: RE | Admit: 2020-01-16 | Discharge: 2020-01-16 | Disposition: A | Discharge status: Home / Self Care | Payer: Medicaid Other | Source: Ambulatory Visit | Attending: Family Medicine | Admitting: Family Medicine

## 2020-01-16 ENCOUNTER — Other Ambulatory Visit: Payer: Self-pay

## 2020-01-16 ENCOUNTER — Other Ambulatory Visit: Payer: Self-pay | Admitting: Family Medicine

## 2020-01-16 DIAGNOSIS — N6314 Unspecified lump in the right breast, lower inner quadrant: Secondary | ICD-10-CM

## 2020-01-16 DIAGNOSIS — N6489 Other specified disorders of breast: Secondary | ICD-10-CM

## 2020-01-16 DIAGNOSIS — R928 Other abnormal and inconclusive findings on diagnostic imaging of breast: Secondary | ICD-10-CM | POA: Diagnosis not present

## 2020-01-24 NOTE — Progress Notes (Signed)
Patient has appt

## 2020-02-22 DIAGNOSIS — M9902 Segmental and somatic dysfunction of thoracic region: Secondary | ICD-10-CM | POA: Diagnosis not present

## 2020-02-22 DIAGNOSIS — M9901 Segmental and somatic dysfunction of cervical region: Secondary | ICD-10-CM | POA: Diagnosis not present

## 2020-02-22 DIAGNOSIS — M531 Cervicobrachial syndrome: Secondary | ICD-10-CM | POA: Diagnosis not present

## 2020-02-22 DIAGNOSIS — M5414 Radiculopathy, thoracic region: Secondary | ICD-10-CM | POA: Diagnosis not present

## 2020-02-26 DIAGNOSIS — M5414 Radiculopathy, thoracic region: Secondary | ICD-10-CM | POA: Diagnosis not present

## 2020-02-26 DIAGNOSIS — M9901 Segmental and somatic dysfunction of cervical region: Secondary | ICD-10-CM | POA: Diagnosis not present

## 2020-02-26 DIAGNOSIS — M531 Cervicobrachial syndrome: Secondary | ICD-10-CM | POA: Diagnosis not present

## 2020-02-26 DIAGNOSIS — M9902 Segmental and somatic dysfunction of thoracic region: Secondary | ICD-10-CM | POA: Diagnosis not present

## 2020-02-29 DIAGNOSIS — M531 Cervicobrachial syndrome: Secondary | ICD-10-CM | POA: Diagnosis not present

## 2020-02-29 DIAGNOSIS — M5414 Radiculopathy, thoracic region: Secondary | ICD-10-CM | POA: Diagnosis not present

## 2020-02-29 DIAGNOSIS — M9902 Segmental and somatic dysfunction of thoracic region: Secondary | ICD-10-CM | POA: Diagnosis not present

## 2020-02-29 DIAGNOSIS — M9901 Segmental and somatic dysfunction of cervical region: Secondary | ICD-10-CM | POA: Diagnosis not present

## 2020-03-03 DIAGNOSIS — M5414 Radiculopathy, thoracic region: Secondary | ICD-10-CM | POA: Diagnosis not present

## 2020-03-03 DIAGNOSIS — M9902 Segmental and somatic dysfunction of thoracic region: Secondary | ICD-10-CM | POA: Diagnosis not present

## 2020-03-03 DIAGNOSIS — M531 Cervicobrachial syndrome: Secondary | ICD-10-CM | POA: Diagnosis not present

## 2020-03-03 DIAGNOSIS — M9901 Segmental and somatic dysfunction of cervical region: Secondary | ICD-10-CM | POA: Diagnosis not present

## 2020-04-03 DIAGNOSIS — H95123 Granulation of postmastoidectomy cavity, bilateral ears: Secondary | ICD-10-CM | POA: Diagnosis not present

## 2020-04-23 ENCOUNTER — Telehealth: Payer: Self-pay | Admitting: Family Medicine

## 2020-04-23 DIAGNOSIS — E039 Hypothyroidism, unspecified: Secondary | ICD-10-CM

## 2020-04-23 NOTE — Telephone Encounter (Signed)
Patient aware.

## 2020-04-23 NOTE — Telephone Encounter (Signed)
Please have her come in for repeat thyroid panel as per last checkup recommendation.  She was supposed to get that rechecked in march.

## 2020-04-23 NOTE — Telephone Encounter (Signed)
Please review and advise.

## 2020-04-25 ENCOUNTER — Other Ambulatory Visit: Payer: Medicaid Other

## 2020-04-25 ENCOUNTER — Other Ambulatory Visit: Payer: Self-pay

## 2020-04-25 DIAGNOSIS — E039 Hypothyroidism, unspecified: Secondary | ICD-10-CM

## 2020-04-26 LAB — THYROID PANEL WITH TSH
Free Thyroxine Index: 1.4 (ref 1.2–4.9)
T3 Uptake Ratio: 24 % (ref 24–39)
T4, Total: 5.9 ug/dL (ref 4.5–12.0)
TSH: 4.8 u[IU]/mL — ABNORMAL HIGH (ref 0.450–4.500)

## 2020-05-01 ENCOUNTER — Other Ambulatory Visit: Payer: Self-pay | Admitting: Family

## 2020-05-01 MED ORDER — LEVOTHYROXINE SODIUM 100 MCG PO TABS: 100.0000 ug | ORAL_TABLET | Freq: Every day | ORAL | 11 refills | Status: DC

## 2020-05-05 ENCOUNTER — Telehealth: Payer: Self-pay | Admitting: Family Medicine

## 2020-05-05 ENCOUNTER — Other Ambulatory Visit: Payer: Self-pay | Admitting: *Deleted

## 2020-05-05 DIAGNOSIS — F411 Generalized anxiety disorder: Secondary | ICD-10-CM

## 2020-05-05 DIAGNOSIS — F330 Major depressive disorder, recurrent, mild: Secondary | ICD-10-CM

## 2020-05-05 IMAGING — DX DG FOOT COMPLETE 3+V*R*
3 series · 3 of 3 positions shown · non-contrast
Comparison: 09/20/2017

CLINICAL DATA: Right heel pain.

EXAM:
RIGHT FOOT COMPLETE - 3+ VIEW

[foot ap]
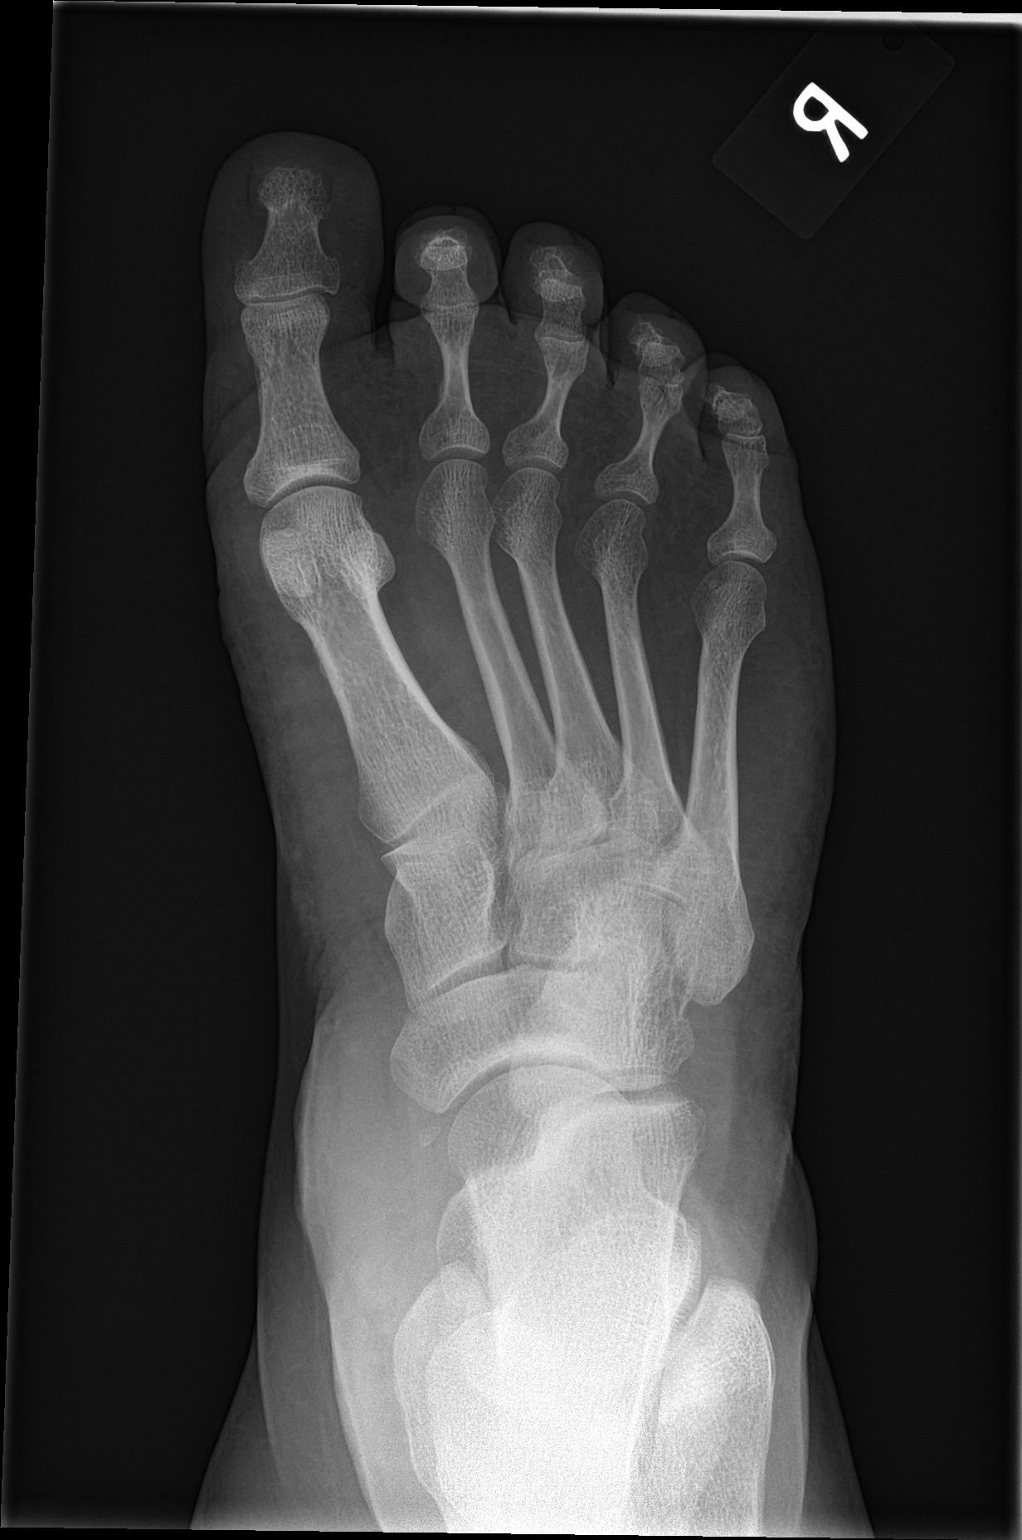

[foot obl]
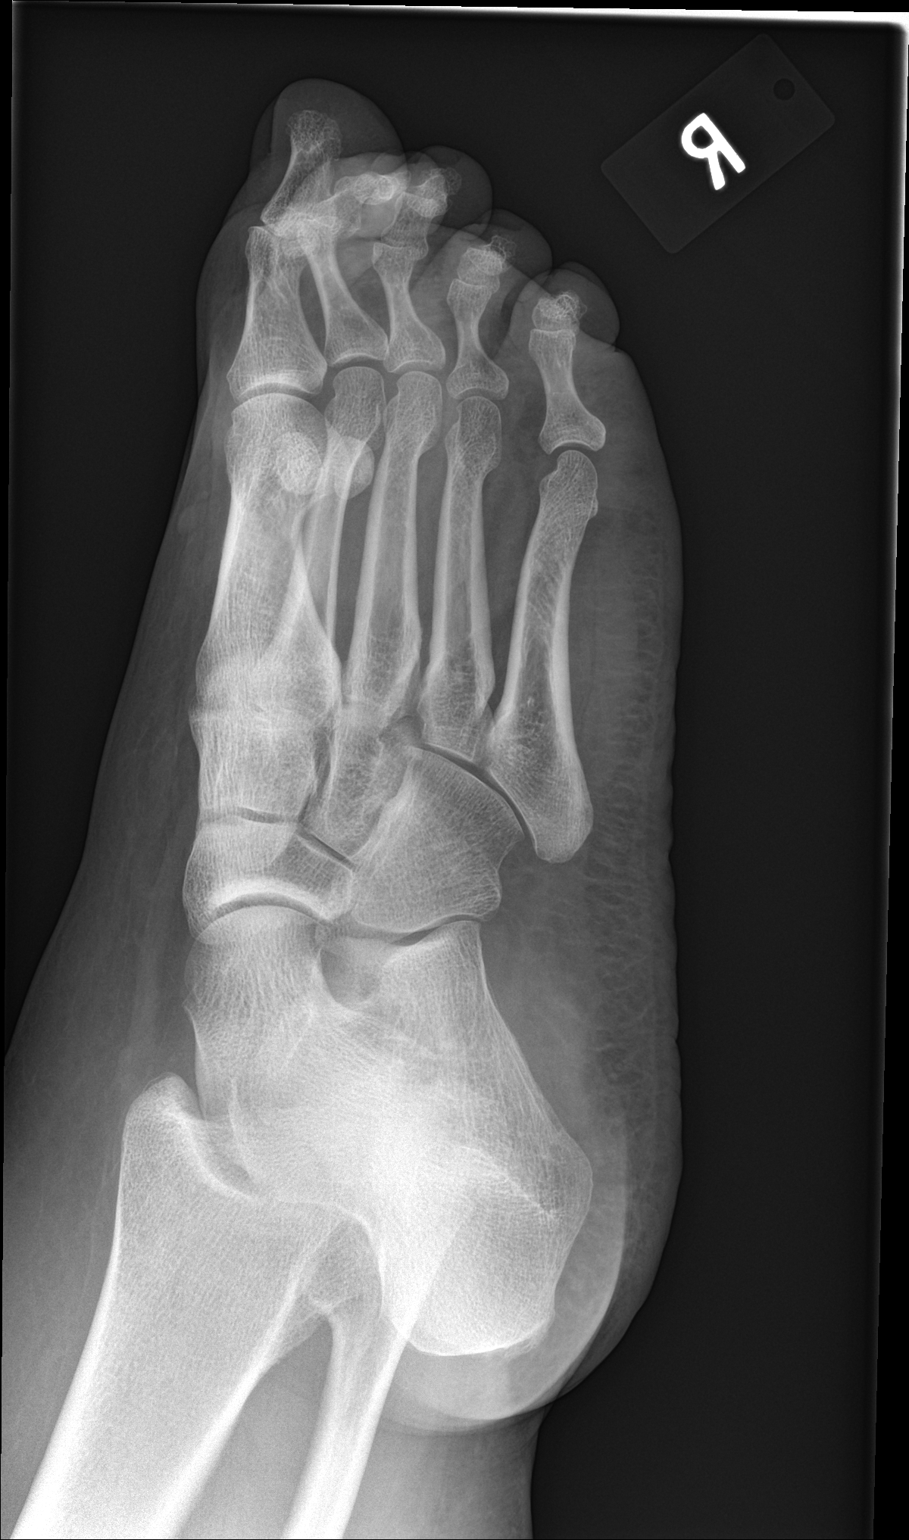

[foot lat]
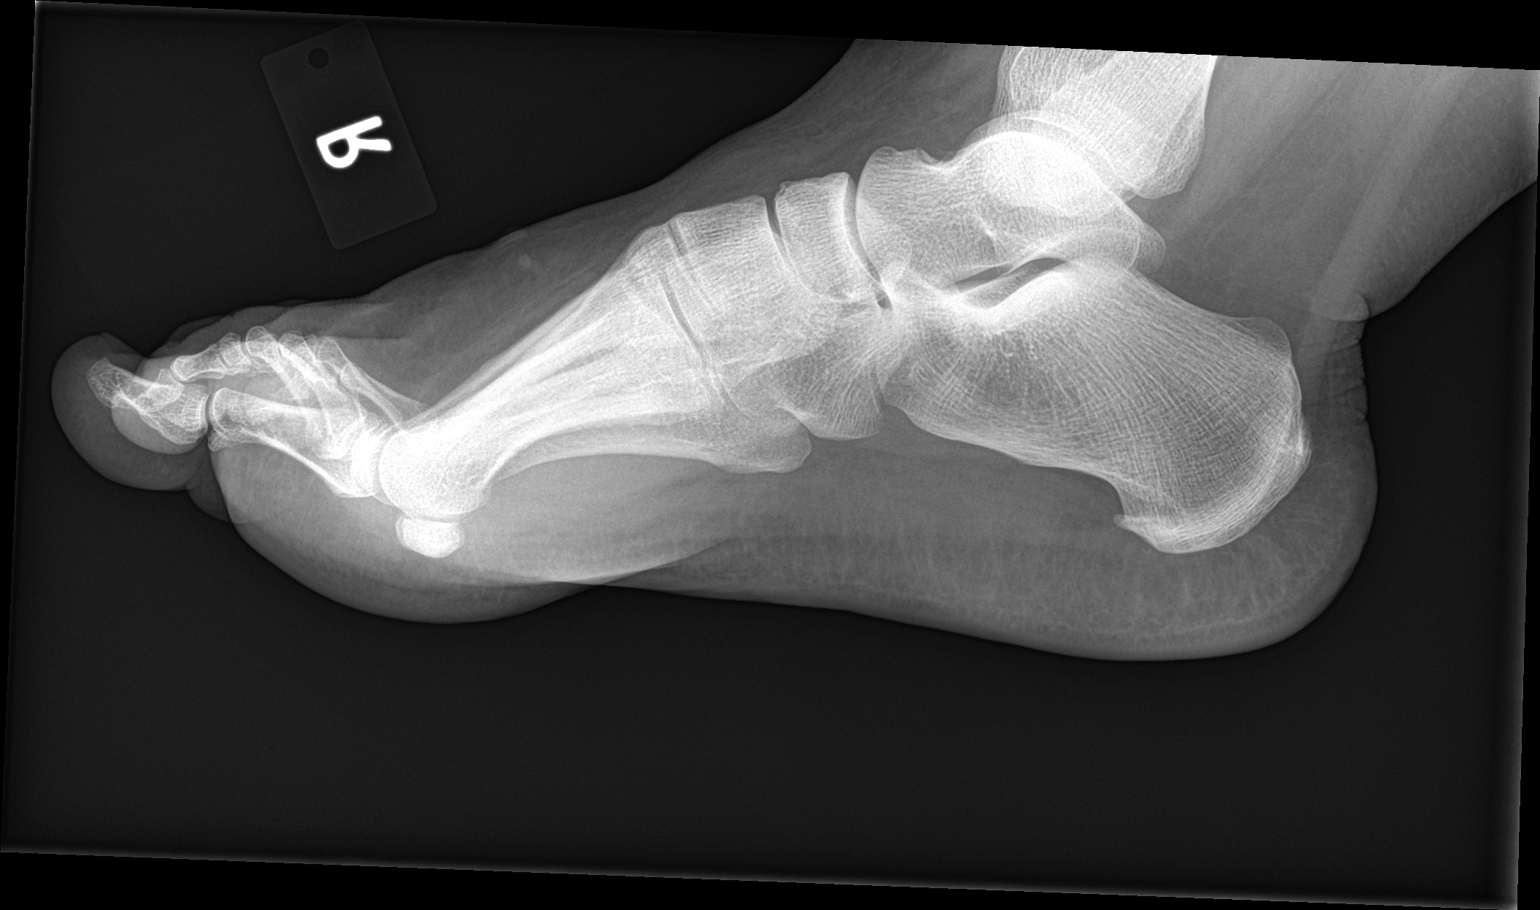

[3 of 3 positions shown; findings below may reference images not displayed]

FINDINGS: No fracture or dislocation is identified. There is a small plantar
calcaneal enthesophyte which is new or larger than on the prior
study. No osseous erosion or focal soft tissue abnormality is seen.
IMPRESSION: Small plantar calcaneal enthesophyte.  No acute osseous abnormality.

## 2020-05-05 MED ORDER — FLUOXETINE HCL 40 MG PO CAPS: 40.0000 mg | ORAL_CAPSULE | Freq: Every day | ORAL | 0 refills | Status: DC

## 2020-05-05 NOTE — Telephone Encounter (Signed)
°  Prescription Request  05/05/2020  What is the name of the medication or equipment? Medication for depression and anxiety does not remember the name of it   Have you contacted your pharmacy to request a refill? (if applicable) yes  Which pharmacy would you like this sent to? [facility]    Patient notified that their request is being sent to the clinical staff for review and that they should receive a response within 2 business days.

## 2020-05-05 NOTE — Telephone Encounter (Signed)
Patient aware, script is ready. 

## 2020-06-06 ENCOUNTER — Ambulatory Visit: Payer: Medicaid Other | Admitting: Family Medicine

## 2020-07-09 ENCOUNTER — Ambulatory Visit: Payer: Medicaid Other | Admitting: Family Medicine

## 2020-07-11 ENCOUNTER — Encounter: Payer: Self-pay | Admitting: Family Medicine

## 2020-07-13 ENCOUNTER — Other Ambulatory Visit: Payer: Self-pay | Admitting: Family Medicine

## 2020-07-13 DIAGNOSIS — K219 Gastro-esophageal reflux disease without esophagitis: Secondary | ICD-10-CM

## 2020-07-14 ENCOUNTER — Telehealth: Payer: Self-pay | Admitting: Family Medicine

## 2020-07-17 ENCOUNTER — Other Ambulatory Visit: Payer: Medicaid Other

## 2020-07-31 ENCOUNTER — Emergency Department (HOSPITAL_COMMUNITY)
Admission: EM | Admit: 2020-07-31 | Discharge: 2020-07-31 | Disposition: A | Discharge status: Home / Self Care | Payer: Medicaid Other | Attending: Emergency Medicine | Admitting: Emergency Medicine

## 2020-07-31 ENCOUNTER — Encounter (HOSPITAL_COMMUNITY): Payer: Self-pay | Admitting: Emergency Medicine

## 2020-07-31 ENCOUNTER — Other Ambulatory Visit: Payer: Self-pay

## 2020-07-31 DIAGNOSIS — M79632 Pain in left forearm: Secondary | ICD-10-CM | POA: Insufficient documentation

## 2020-07-31 DIAGNOSIS — Z5321 Procedure and treatment not carried out due to patient leaving prior to being seen by health care provider: Secondary | ICD-10-CM | POA: Diagnosis not present

## 2020-07-31 NOTE — ED Triage Notes (Signed)
Pt reports left forearm pain that started yesterday. Pt denies injury to the area.

## 2020-08-01 ENCOUNTER — Telehealth: Payer: Self-pay | Admitting: *Deleted

## 2020-08-01 NOTE — Telephone Encounter (Signed)
Amber Colon presented to the ED and left before being seen by the provider on 07/31/20. The patient has been enrolled in an automated general discharge outreach program and 2 attempts to contact the patient will be made to follow up on their ED visit and subsequent needs. The care management team is available to provide assistance to this patient at any time.   Burnard Bunting, RN, BSN, CCRN [facility] [phone]

## 2020-08-07 ENCOUNTER — Ambulatory Visit: Payer: Medicaid Other | Admitting: Family Medicine

## 2020-08-07 ENCOUNTER — Telehealth: Payer: Self-pay | Admitting: Family Medicine

## 2020-08-08 ENCOUNTER — Encounter: Payer: Self-pay | Admitting: Family Medicine

## 2020-08-11 ENCOUNTER — Encounter: Payer: Self-pay | Admitting: *Deleted

## 2020-08-11 NOTE — Telephone Encounter (Signed)
Patient has been dismissed from practice.

## 2020-09-12 DIAGNOSIS — E039 Hypothyroidism, unspecified: Secondary | ICD-10-CM | POA: Diagnosis not present

## 2020-09-12 DIAGNOSIS — K2990 Gastroduodenitis, unspecified, without bleeding: Secondary | ICD-10-CM | POA: Diagnosis not present

## 2020-09-12 DIAGNOSIS — F331 Major depressive disorder, recurrent, moderate: Secondary | ICD-10-CM | POA: Diagnosis not present

## 2020-09-12 DIAGNOSIS — I10 Essential (primary) hypertension: Secondary | ICD-10-CM | POA: Diagnosis not present

## 2020-09-12 DIAGNOSIS — K2970 Gastritis, unspecified, without bleeding: Secondary | ICD-10-CM | POA: Diagnosis not present

## 2020-09-12 DIAGNOSIS — F411 Generalized anxiety disorder: Secondary | ICD-10-CM | POA: Diagnosis not present

## 2020-10-02 DIAGNOSIS — H95123 Granulation of postmastoidectomy cavity, bilateral ears: Secondary | ICD-10-CM | POA: Diagnosis not present

## 2020-10-30 ENCOUNTER — Other Ambulatory Visit: Payer: Self-pay | Admitting: Family Medicine

## 2020-10-30 DIAGNOSIS — R197 Diarrhea, unspecified: Secondary | ICD-10-CM

## 2020-11-10 DIAGNOSIS — E8881 Metabolic syndrome: Secondary | ICD-10-CM | POA: Diagnosis not present

## 2020-11-10 DIAGNOSIS — F331 Major depressive disorder, recurrent, moderate: Secondary | ICD-10-CM | POA: Diagnosis not present

## 2020-11-10 DIAGNOSIS — F32A Depression, unspecified: Secondary | ICD-10-CM | POA: Diagnosis not present

## 2020-11-10 DIAGNOSIS — I10 Essential (primary) hypertension: Secondary | ICD-10-CM | POA: Diagnosis not present

## 2020-11-10 DIAGNOSIS — K2970 Gastritis, unspecified, without bleeding: Secondary | ICD-10-CM | POA: Diagnosis not present

## 2020-11-10 DIAGNOSIS — F411 Generalized anxiety disorder: Secondary | ICD-10-CM | POA: Diagnosis not present

## 2020-11-10 DIAGNOSIS — K2990 Gastroduodenitis, unspecified, without bleeding: Secondary | ICD-10-CM | POA: Diagnosis not present

## 2020-11-10 DIAGNOSIS — R197 Diarrhea, unspecified: Secondary | ICD-10-CM | POA: Diagnosis not present

## 2020-11-10 DIAGNOSIS — E039 Hypothyroidism, unspecified: Secondary | ICD-10-CM | POA: Diagnosis not present

## 2020-12-22 ENCOUNTER — Ambulatory Visit: Payer: Medicaid Other

## 2021-02-13 DIAGNOSIS — R197 Diarrhea, unspecified: Secondary | ICD-10-CM | POA: Diagnosis not present

## 2021-02-13 DIAGNOSIS — M5441 Lumbago with sciatica, right side: Secondary | ICD-10-CM | POA: Diagnosis not present

## 2021-02-13 DIAGNOSIS — F411 Generalized anxiety disorder: Secondary | ICD-10-CM | POA: Diagnosis not present

## 2021-02-13 DIAGNOSIS — E039 Hypothyroidism, unspecified: Secondary | ICD-10-CM | POA: Diagnosis not present

## 2021-02-13 DIAGNOSIS — K2970 Gastritis, unspecified, without bleeding: Secondary | ICD-10-CM | POA: Diagnosis not present

## 2021-02-13 DIAGNOSIS — Z6841 Body Mass Index (BMI) 40.0 and over, adult: Secondary | ICD-10-CM | POA: Diagnosis not present

## 2021-02-13 DIAGNOSIS — K2990 Gastroduodenitis, unspecified, without bleeding: Secondary | ICD-10-CM | POA: Diagnosis not present

## 2021-02-13 DIAGNOSIS — F331 Major depressive disorder, recurrent, moderate: Secondary | ICD-10-CM | POA: Diagnosis not present

## 2021-03-10 DIAGNOSIS — D509 Iron deficiency anemia, unspecified: Secondary | ICD-10-CM | POA: Diagnosis not present

## 2021-03-10 DIAGNOSIS — Z6841 Body Mass Index (BMI) 40.0 and over, adult: Secondary | ICD-10-CM | POA: Diagnosis not present

## 2021-03-10 DIAGNOSIS — Z13228 Encounter for screening for other metabolic disorders: Secondary | ICD-10-CM | POA: Diagnosis not present

## 2021-03-10 DIAGNOSIS — F419 Anxiety disorder, unspecified: Secondary | ICD-10-CM | POA: Diagnosis not present

## 2021-03-10 DIAGNOSIS — F32A Depression, unspecified: Secondary | ICD-10-CM | POA: Diagnosis not present

## 2021-04-18 ENCOUNTER — Emergency Department (HOSPITAL_COMMUNITY)
Admission: EM | Admit: 2021-04-18 | Discharge: 2021-04-18 | Disposition: A | Discharge status: Home / Self Care | Payer: Medicaid Other | Attending: Emergency Medicine | Admitting: Emergency Medicine

## 2021-04-18 ENCOUNTER — Emergency Department (HOSPITAL_COMMUNITY): Payer: Medicaid Other

## 2021-04-18 ENCOUNTER — Other Ambulatory Visit: Payer: Self-pay

## 2021-04-18 DIAGNOSIS — R079 Chest pain, unspecified: Secondary | ICD-10-CM | POA: Diagnosis not present

## 2021-04-18 DIAGNOSIS — D649 Anemia, unspecified: Secondary | ICD-10-CM | POA: Diagnosis not present

## 2021-04-18 DIAGNOSIS — Z79899 Other long term (current) drug therapy: Secondary | ICD-10-CM | POA: Insufficient documentation

## 2021-04-18 DIAGNOSIS — R110 Nausea: Secondary | ICD-10-CM | POA: Insufficient documentation

## 2021-04-18 DIAGNOSIS — F17210 Nicotine dependence, cigarettes, uncomplicated: Secondary | ICD-10-CM | POA: Diagnosis not present

## 2021-04-18 DIAGNOSIS — R0602 Shortness of breath: Secondary | ICD-10-CM | POA: Insufficient documentation

## 2021-04-18 DIAGNOSIS — R0789 Other chest pain: Secondary | ICD-10-CM | POA: Diagnosis not present

## 2021-04-18 DIAGNOSIS — M79602 Pain in left arm: Secondary | ICD-10-CM | POA: Insufficient documentation

## 2021-04-18 DIAGNOSIS — M25512 Pain in left shoulder: Secondary | ICD-10-CM | POA: Diagnosis not present

## 2021-04-18 DIAGNOSIS — I517 Cardiomegaly: Secondary | ICD-10-CM | POA: Diagnosis not present

## 2021-04-18 DIAGNOSIS — E039 Hypothyroidism, unspecified: Secondary | ICD-10-CM | POA: Diagnosis not present

## 2021-04-18 DIAGNOSIS — R0689 Other abnormalities of breathing: Secondary | ICD-10-CM | POA: Diagnosis not present

## 2021-04-18 DIAGNOSIS — R457 State of emotional shock and stress, unspecified: Secondary | ICD-10-CM | POA: Diagnosis not present

## 2021-04-18 DIAGNOSIS — I10 Essential (primary) hypertension: Secondary | ICD-10-CM | POA: Insufficient documentation

## 2021-04-18 LAB — FOLATE: Folate: 7.3 ng/mL (ref >5.9)

## 2021-04-18 LAB — CBC
HCT: 29.3 % — ABNORMAL LOW (ref 36.0–46.0)
Hemoglobin: 8.2 g/dL — ABNORMAL LOW (ref 12.0–15.0)
MCH: 18.7 pg — ABNORMAL LOW (ref 26.0–34.0)
MCHC: 28 g/dL — ABNORMAL LOW (ref 30.0–36.0)
MCV: 66.7 fL — ABNORMAL LOW (ref 80.0–100.0)
Platelets: 272 10*3/uL (ref 150–400)
RBC: 4.39 MIL/uL (ref 3.87–5.11)
RDW: 19.9 % — ABNORMAL HIGH (ref 11.5–15.5)
WBC: 7.8 10*3/uL (ref 4.0–10.5)
nRBC: 0 % (ref 0.0–0.2)

## 2021-04-18 LAB — BASIC METABOLIC PANEL
Anion gap: 7 (ref 5–15)
BUN: 10 mg/dL (ref 6–20)
CO2: 24 mmol/L (ref 22–32)
Calcium: 9.1 mg/dL (ref 8.9–10.3)
Chloride: 106 mmol/L (ref 98–111)
Creatinine, Ser: 1.07 mg/dL — ABNORMAL HIGH (ref 0.44–1.00)
GFR, Estimated: >60 mL/min (ref >60)
Glucose, Bld: 116 mg/dL — ABNORMAL HIGH (ref 70–99)
Potassium: 3.6 mmol/L (ref 3.5–5.1)
Sodium: 137 mmol/L (ref 135–145)

## 2021-04-18 LAB — IRON AND TIBC
Iron: 16 ug/dL — ABNORMAL LOW (ref 28–170)
Saturation Ratios: 4 % — ABNORMAL LOW (ref 10.4–31.8)
TIBC: 421 ug/dL (ref 250–450)
UIBC: 405 ug/dL

## 2021-04-18 LAB — RETICULOCYTES
Immature Retic Fract: 31.1 % — ABNORMAL HIGH (ref 2.3–15.9)
RBC.: 4.56 MIL/uL (ref 3.87–5.11)
Retic Count, Absolute: 71.6 10*3/uL (ref 19.0–186.0)
Retic Ct Pct: 1.6 % (ref 0.4–3.1)

## 2021-04-18 LAB — TROPONIN I (HIGH SENSITIVITY): Troponin I (High Sensitivity): <2 ng/L (ref <18)

## 2021-04-18 LAB — FERRITIN: Ferritin: 4 ng/mL — ABNORMAL LOW (ref 11–307)

## 2021-04-18 LAB — VITAMIN B12: Vitamin B-12: 158 pg/mL — ABNORMAL LOW (ref 180–914)

## 2021-04-18 NOTE — ED Triage Notes (Signed)
Pt having chest pain and left shoulder pain since Tuesday. Pt related her pains to anxiety she has been having. Pt has hx of anxiety. Pt has stressors currently. VSS. Pt arrived by [facility].

## 2021-04-18 NOTE — ED Notes (Signed)
Pt ambulated to restroom and provided urine sample if needed.

## 2021-04-18 NOTE — Discharge Instructions (Addendum)
Suspect musculoskeletal cause for your pain today, for this you can take Motrin and Tylenol as directed. As discussed, you are anemic, suspect this is due to iron deficiency however anemia panel was sent out today and your doctor can follow-up with you for these results.  In the meantime, recommend a prenatal vitamin with iron as discussed.  This can cause constipation, managed with Colace and MiraLAX as needed.

## 2021-04-18 NOTE — ED Provider Notes (Signed)
[facility] EMERGENCY DEPARTMENT Provider Note   CSN: [id number] Arrival date & time: 04/18/21  1211     History Chief Complaint  Patient presents with  . Chest Pain    Amber Colon is a 40 y.o. female.  40 year old female with complaint of left side chest pain onset 2 days ago (04/16/21) upon waking, constant since, sharp in nature, radiates to left shoulder and down left arm, associated with shortness of breath, nausea.  Nothing makes pain worse including exertion, nothing alleviates pain.  Denies history of similar pain previously.  Reports history of hypertension which is improved and does not currently take medication, denies hyperlipidemia, diabetes.  No significant family history.  No other complaints or concerns.        Past Medical History:  Diagnosis Date  . Anemia    not taking her iron pills either  . Anxiety   . Depression   . Gallstones   . GERD (gastroesophageal reflux disease)   . Headache    "occasional"  . History of blood transfusion    blood after miscarrige  . Hypertension    doesn't take her bp pills  . Hypothyroidism    again, takes no med  . Morbid obesity (HCC) 01/16/2019  . Obesity   . Panic attack     Patient Active Problem List   Diagnosis Date Noted  . Primary osteoarthritis of right knee 01/08/2020  . Pain in left knee 08/23/2019  . Pain in right knee 08/23/2019  . Left wrist pain 01/16/2019  . Dysphagia   . Gastritis and gastroduodenitis   . Family history of early CAD 09/20/2017  . 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 Depression 12/24/2016  . S/P cholecystectomy 09/23/2016  . Current smoker 02/27/2016  . GAD (generalized anxiety disorder) 01/22/2016  . GERD (gastroesophageal reflux disease) 01/22/2016  . Morbid obesity with BMI of 50.0-59.9, adult (HCC) 01/22/2016  . Metabolic syndrome 01/22/2016  . Hypothyroidism 06/18/2015  . Anemia, iron deficiency 06/18/2015  . Essential hypertension 06/18/2015    Past  Surgical History:  Procedure Laterality Date  . CHOLECYSTECTOMY N/A 09/08/2016   Procedure: LAPAROSCOPIC CHOLECYSTECTOMY WITH INTRAOPERATIVE CHOLANGIOGRAM;  Surgeon: Chevis Pretty III, MD;  Location: [facility] OR;  Service: General;  Laterality: N/A;  . DILATION AND CURETTAGE OF UTERUS    . ESOPHAGOGASTRODUODENOSCOPY (EGD) WITH PROPOFOL N/A 02/23/2018   Procedure: ESOPHAGOGASTRODUODENOSCOPY (EGD) WITH PROPOFOL;  Surgeon: Rachael Fee, MD;  Location: [facility] ENDOSCOPY;  Service: Endoscopy;  Laterality: N/A;  . TUBAL LIGATION    . TYMPANOMASTOIDECTOMY Left 12/24/2015   Procedure: LEFT MODIFIED  RADICAL TYMPANOMASTOIDECTOMY;  Surgeon: Newman Pies, MD;  Location: [facility] OR;  Service: ENT;  Laterality: Left;  . TYMPANOMASTOIDECTOMY Right 07/07/2016   Procedure: RIGHT TYMPANOMASTOIDECTOMY;  Surgeon: Newman Pies, MD;  Location: [facility] OR;  Service: ENT;  Laterality: Right;     OB History    Gravida  4   Para      Term      Preterm      AB      Living  3     SAB      IAB      Ectopic      Multiple      Live Births              Family History  Problem Relation Age of Onset  . Diabetes Mother   . Heart disease Mother   . Ovarian cancer Mother   . Stroke Mother   .  Hypertension Mother   . Diabetes Father   . COPD Father   . Heart disease Father   . Anuerysm Father        Neck, stomach  . Breast cancer Maternal Grandmother 50  . Diabetes Maternal Grandfather   . Diabetes Paternal Aunt   . Diabetes Paternal Uncle     Social History   Tobacco Use  . Smoking status: Current Some Day Smoker    Packs/day: 0.25    Years: 11.00    Pack years: 2.75    Types: Cigarettes  . Smokeless tobacco: Never Used  . Tobacco comment: 3-4 cigs a day  Vaping Use  . Vaping Use: Never used  Substance Use Topics  . Alcohol use: No  . Drug use: No    Home Medications Prior to Admission medications   Medication Sig Start Date End Date Taking? Authorizing Provider  colestipol (COLESTID) 1 g tablet Take 2  tablets (2 g total) by mouth 2 (two) times daily. 01/04/20   Raliegh Ip, DO  diclofenac Sodium (VOLTAREN) 1 % GEL Apply 2 g topically 4 (four) times daily. 01/08/20   Sonny Masters, FNP  FLUoxetine (PROZAC) 40 MG capsule Take 1 capsule (40 mg total) by mouth daily. 05/05/20   Raliegh Ip, DO  levothyroxine (SYNTHROID) 100 MCG tablet Take 1 tablet (100 mcg total) by mouth daily. 05/01/20 05/01/21  Junie Spencer, FNP  metroNIDAZOLE (FLAGYL) 500 MG tablet Take 1 tablet (500 mg total) by mouth 2 (two) times daily. 12/31/19   Raliegh Ip, DO  nystatin cream (MYCOSTATIN) Apply 1 application topically 2 (two) times daily. To the EXTERNAL vagina x10-14 days 12/31/19   Raliegh Ip, DO  pantoprazole (PROTONIX) 40 MG tablet Take 1 tablet (40 mg total) by mouth daily. (Needs to be seen before next refill) 07/14/20   Raliegh Ip, DO  predniSONE (DELTASONE) 20 MG tablet 2 po at sametime daily for 5 days 01/08/20   Sonny Masters, FNP    Allergies    Amoxicillin  Review of Systems   Review of Systems  Constitutional: Negative for chills and fever.  Respiratory: Positive for shortness of breath.   Cardiovascular: Positive for chest pain. Negative for palpitations and leg swelling.  Gastrointestinal: Positive for nausea. Negative for abdominal pain, constipation, diarrhea and vomiting.  Genitourinary: Negative for dysuria and frequency.  Musculoskeletal: Positive for arthralgias and myalgias.  Skin: Negative for rash and wound.  Allergic/Immunologic: Negative for immunocompromised state.  Neurological: Negative for weakness and numbness.  Hematological: Negative for adenopathy.  Psychiatric/Behavioral: Negative for confusion.  All other systems reviewed and are negative.   Physical Exam Updated Vital Signs BP 135/80   Pulse 81   Temp 98.2 F (36.8 C) (Oral)   Resp 16   Ht 5\' 6"  (1.676 m)   Wt (!) 170.1 kg   SpO2 100%   BMI 60.53 kg/m   Physical  Exam Vitals and nursing note reviewed.  Constitutional:      General: She is not in acute distress.    Appearance: She is well-developed. She is not diaphoretic.  HENT:     Head: Normocephalic and atraumatic.  Cardiovascular:     Rate and Rhythm: Normal rate and regular rhythm.     Heart sounds: Normal heart sounds. No murmur heard.   Pulmonary:     Effort: Pulmonary effort is normal.     Breath sounds: Normal breath sounds.  Chest:     Chest wall:  Tenderness present.    Abdominal:     Palpations: Abdomen is soft.     Tenderness: There is no abdominal tenderness.  Musculoskeletal:       Arms:     Cervical back: Normal range of motion and neck supple.     Right lower leg: No tenderness. No edema.     Left lower leg: No tenderness. No edema.  Skin:    General: Skin is warm and dry.  Neurological:     Mental Status: She is alert and oriented to person, place, and time.  Psychiatric:        Behavior: Behavior normal.     ED Results / Procedures / Treatments   Labs (all labs ordered are listed, but only abnormal results are displayed) Labs Reviewed  BASIC METABOLIC PANEL - Abnormal; Notable for the following components:      Result Value   Glucose, Bld 116 (*)    Creatinine, Ser 1.07 (*)    All other components within normal limits  CBC - Abnormal; Notable for the following components:   Hemoglobin 8.2 (*)    HCT 29.3 (*)    MCV 66.7 (*)    MCH 18.7 (*)    MCHC 28.0 (*)    RDW 19.9 (*)    All other components within normal limits  VITAMIN B12  FOLATE  IRON AND TIBC  FERRITIN  RETICULOCYTES  TROPONIN I (HIGH SENSITIVITY)    EKG None  Radiology DG Chest Port 1 View  Result Date: 04/18/2021 CLINICAL DATA:  Chest pain EXAM: PORTABLE CHEST 1 VIEW COMPARISON:  12/09/2017 FINDINGS: Similar cardiomegaly allowing for differences in technique. No consolidation, edema, or pleural effusion. No pneumothorax. IMPRESSION: No acute process in the chest.  Cardiomegaly.  Electronically Signed   By: Guadlupe Spanish M.D.   On: 04/18/2021 13:34    Procedures Procedures   Medications Ordered in ED Medications - No data to display  ED Course  I have reviewed the triage vital signs and the nursing notes.  Pertinent labs & imaging results that were available during my care of the patient were reviewed by me and considered in my medical decision making (see chart for details).  Clinical Course as of 04/18/21 1409  Sat Apr 18, 2021  213 40 year old female with complaint of left-sided chest pain as above.  On exam found at times with palpation of the left trapezius muscle as well as left chest wall tenderness. Suspect musculoskeletal cause for her pain however will evaluate with cardiac work-up, offered reassurance. [LM]  1355 03/09/21 hgb 9.1 on review in care everywhere.  [LM]  1408 Patient is found to have anemia with hemoglobin of 8.2, reports heavy menstrual cycles with recent cycle.  Patient has been told she is anemic in the past however is noncompliant with iron supplement, not formally diagnosed with iron deficiency anemia.  Anemia panel sent out today, patient agrees to contact her PCP for follow-up on this and further management, the meantime recommend prenatal vitamin with iron.  EKG without changes from prior, troponin is less than 2, with pain ongoing for the past 2 days, doubt ACS.  BMP without significant changes. [LM]    Clinical Course User Index [LM] Alden Hipp   MDM Rules/Calculators/A&P                          Final Clinical Impression(s) / ED Diagnoses Final diagnoses:  Chest wall pain  Anemia, unspecified  type    Rx / DC Orders ED Discharge Orders    None       Leilyn Frayre, LauAlden Hippra A, PA-C 04/18/21 1410    Blane OharaZavitz, Joshua, MD 04/18/21 1547

## 2021-04-20 ENCOUNTER — Telehealth: Payer: Self-pay

## 2021-04-20 NOTE — Telephone Encounter (Signed)
Transition Care Management Follow-up Telephone Call  Date of discharge and from where: 04/18/2021 from [facility]  How have you been since you were released from the hospital? Pt stated that she is feeling better and has not questions or concerns. Pt has started her iron supplement. PCP has been updated in [facility].   Any questions or concerns? No  Items Reviewed:  Did the pt receive and understand the discharge instructions provided? Yes   Medications obtained and verified? Yes   Other? No   Any new allergies since your discharge? No   Dietary orders reviewed? n/a  Do you have support at home? Yes   Functional Questionnaire: (I = Independent and D = Dependent) ADLs: I  Bathing/Dressing- I  Meal Prep- I  Eating- I  Maintaining continence- I  Transferring/Ambulation- I  Managing Meds- I   Follow up appointments reviewed:   PCP Hospital f/u appt confirmed? No    Specialist Hospital f/u appt confirmed? No    Are transportation arrangements needed? No    If their condition worsens, is the pt aware to call PCP or go to the Emergency Dept.? Yes  Was the patient provided with contact information for the PCP's office or ED? Yes  Was to pt encouraged to call back with questions or concerns? Yes

## 2021-04-28 DIAGNOSIS — D509 Iron deficiency anemia, unspecified: Secondary | ICD-10-CM | POA: Diagnosis not present

## 2021-04-28 DIAGNOSIS — E039 Hypothyroidism, unspecified: Secondary | ICD-10-CM | POA: Diagnosis not present

## 2021-05-06 DIAGNOSIS — H7013 Chronic mastoiditis, bilateral: Secondary | ICD-10-CM | POA: Diagnosis not present

## 2021-05-25 DIAGNOSIS — D500 Iron deficiency anemia secondary to blood loss (chronic): Secondary | ICD-10-CM | POA: Diagnosis not present

## 2021-05-25 DIAGNOSIS — N920 Excessive and frequent menstruation with regular cycle: Secondary | ICD-10-CM | POA: Diagnosis not present

## 2021-05-25 DIAGNOSIS — D509 Iron deficiency anemia, unspecified: Secondary | ICD-10-CM | POA: Diagnosis not present

## 2021-06-02 DIAGNOSIS — Z20822 Contact with and (suspected) exposure to covid-19: Secondary | ICD-10-CM | POA: Diagnosis not present

## 2021-06-02 DIAGNOSIS — R52 Pain, unspecified: Secondary | ICD-10-CM | POA: Diagnosis not present

## 2021-06-02 DIAGNOSIS — R0602 Shortness of breath: Secondary | ICD-10-CM | POA: Diagnosis not present

## 2021-06-03 DIAGNOSIS — U071 COVID-19: Secondary | ICD-10-CM | POA: Diagnosis not present

## 2021-06-09 DIAGNOSIS — Z20822 Contact with and (suspected) exposure to covid-19: Secondary | ICD-10-CM | POA: Diagnosis not present

## 2021-06-12 ENCOUNTER — Encounter: Payer: Medicaid Other | Admitting: Obstetrics & Gynecology

## 2021-06-15 DIAGNOSIS — D500 Iron deficiency anemia secondary to blood loss (chronic): Secondary | ICD-10-CM | POA: Diagnosis not present

## 2021-07-01 ENCOUNTER — Encounter: Payer: Medicaid Other | Admitting: Obstetrics & Gynecology

## 2021-07-01 ENCOUNTER — Encounter: Payer: Medicaid Other | Admitting: Adult Health

## 2021-07-21 DIAGNOSIS — D500 Iron deficiency anemia secondary to blood loss (chronic): Secondary | ICD-10-CM | POA: Diagnosis not present

## 2021-07-30 DIAGNOSIS — G8929 Other chronic pain: Secondary | ICD-10-CM | POA: Diagnosis not present

## 2021-07-30 DIAGNOSIS — M1711 Unilateral primary osteoarthritis, right knee: Secondary | ICD-10-CM | POA: Diagnosis not present

## 2021-07-30 DIAGNOSIS — M25561 Pain in right knee: Secondary | ICD-10-CM | POA: Diagnosis not present

## 2021-07-30 DIAGNOSIS — L720 Epidermal cyst: Secondary | ICD-10-CM | POA: Diagnosis not present

## 2021-08-07 DIAGNOSIS — M25562 Pain in left knee: Secondary | ICD-10-CM | POA: Diagnosis not present

## 2021-08-07 DIAGNOSIS — M170 Bilateral primary osteoarthritis of knee: Secondary | ICD-10-CM | POA: Diagnosis not present

## 2021-08-07 DIAGNOSIS — Z6841 Body Mass Index (BMI) 40.0 and over, adult: Secondary | ICD-10-CM | POA: Diagnosis not present

## 2021-08-07 DIAGNOSIS — M25561 Pain in right knee: Secondary | ICD-10-CM | POA: Diagnosis not present

## 2021-08-12 DIAGNOSIS — Z6841 Body Mass Index (BMI) 40.0 and over, adult: Secondary | ICD-10-CM | POA: Diagnosis not present

## 2021-08-12 DIAGNOSIS — M170 Bilateral primary osteoarthritis of knee: Secondary | ICD-10-CM | POA: Diagnosis not present

## 2021-08-20 DIAGNOSIS — L7211 Pilar cyst: Secondary | ICD-10-CM | POA: Diagnosis not present

## 2021-10-02 DIAGNOSIS — N920 Excessive and frequent menstruation with regular cycle: Secondary | ICD-10-CM | POA: Diagnosis not present

## 2021-10-02 DIAGNOSIS — D500 Iron deficiency anemia secondary to blood loss (chronic): Secondary | ICD-10-CM | POA: Diagnosis not present

## 2021-10-29 ENCOUNTER — Other Ambulatory Visit: Payer: Self-pay | Admitting: Family Medicine

## 2021-10-29 DIAGNOSIS — N6314 Unspecified lump in the right breast, lower inner quadrant: Secondary | ICD-10-CM

## 2021-10-30 ENCOUNTER — Ambulatory Visit: Payer: Medicaid Other

## 2021-10-30 ENCOUNTER — Other Ambulatory Visit: Payer: Self-pay | Admitting: Family Medicine

## 2021-10-30 ENCOUNTER — Ambulatory Visit
Admission: RE | Admit: 2021-10-30 | Discharge: 2021-10-30 | Disposition: A | Discharge status: Home / Self Care | Payer: Medicaid Other | Source: Ambulatory Visit | Attending: Family Medicine | Admitting: Family Medicine

## 2021-10-30 DIAGNOSIS — N6324 Unspecified lump in the left breast, lower inner quadrant: Secondary | ICD-10-CM

## 2021-10-30 DIAGNOSIS — N6314 Unspecified lump in the right breast, lower inner quadrant: Secondary | ICD-10-CM

## 2021-10-30 DIAGNOSIS — R922 Inconclusive mammogram: Secondary | ICD-10-CM | POA: Diagnosis not present

## 2021-10-30 DIAGNOSIS — N644 Mastodynia: Secondary | ICD-10-CM | POA: Diagnosis not present

## 2021-11-02 DIAGNOSIS — F411 Generalized anxiety disorder: Secondary | ICD-10-CM | POA: Diagnosis not present

## 2022-01-21 DIAGNOSIS — H7013 Chronic mastoiditis, bilateral: Secondary | ICD-10-CM | POA: Diagnosis not present

## 2022-02-02 DIAGNOSIS — D500 Iron deficiency anemia secondary to blood loss (chronic): Secondary | ICD-10-CM | POA: Diagnosis not present

## 2022-02-02 DIAGNOSIS — N920 Excessive and frequent menstruation with regular cycle: Secondary | ICD-10-CM | POA: Diagnosis not present

## 2022-02-02 DIAGNOSIS — Z6841 Body Mass Index (BMI) 40.0 and over, adult: Secondary | ICD-10-CM | POA: Diagnosis not present

## 2022-02-11 DIAGNOSIS — D500 Iron deficiency anemia secondary to blood loss (chronic): Secondary | ICD-10-CM | POA: Diagnosis not present

## 2022-02-19 DIAGNOSIS — Z1152 Encounter for screening for COVID-19: Secondary | ICD-10-CM | POA: Diagnosis not present

## 2022-02-22 ENCOUNTER — Emergency Department (HOSPITAL_COMMUNITY)
Admission: EM | Admit: 2022-02-22 | Discharge: 2022-02-22 | Disposition: A | Discharge status: Home / Self Care | Payer: Medicaid Other | Attending: Emergency Medicine | Admitting: Emergency Medicine

## 2022-02-22 ENCOUNTER — Encounter (HOSPITAL_COMMUNITY): Payer: Self-pay | Admitting: *Deleted

## 2022-02-22 ENCOUNTER — Emergency Department (HOSPITAL_COMMUNITY): Payer: Medicaid Other

## 2022-02-22 DIAGNOSIS — R1031 Right lower quadrant pain: Secondary | ICD-10-CM | POA: Diagnosis not present

## 2022-02-22 DIAGNOSIS — N281 Cyst of kidney, acquired: Secondary | ICD-10-CM | POA: Diagnosis not present

## 2022-02-22 DIAGNOSIS — N3001 Acute cystitis with hematuria: Secondary | ICD-10-CM | POA: Diagnosis not present

## 2022-02-22 DIAGNOSIS — R1084 Generalized abdominal pain: Secondary | ICD-10-CM | POA: Diagnosis not present

## 2022-02-22 DIAGNOSIS — R1030 Lower abdominal pain, unspecified: Secondary | ICD-10-CM | POA: Diagnosis present

## 2022-02-22 DIAGNOSIS — Z79899 Other long term (current) drug therapy: Secondary | ICD-10-CM | POA: Insufficient documentation

## 2022-02-22 DIAGNOSIS — I10 Essential (primary) hypertension: Secondary | ICD-10-CM | POA: Diagnosis not present

## 2022-02-22 DIAGNOSIS — N939 Abnormal uterine and vaginal bleeding, unspecified: Secondary | ICD-10-CM | POA: Diagnosis not present

## 2022-02-22 DIAGNOSIS — K625 Hemorrhage of anus and rectum: Secondary | ICD-10-CM

## 2022-02-22 DIAGNOSIS — R52 Pain, unspecified: Secondary | ICD-10-CM | POA: Diagnosis not present

## 2022-02-22 DIAGNOSIS — E039 Hypothyroidism, unspecified: Secondary | ICD-10-CM | POA: Diagnosis not present

## 2022-02-22 DIAGNOSIS — M549 Dorsalgia, unspecified: Secondary | ICD-10-CM | POA: Diagnosis not present

## 2022-02-22 LAB — COMPREHENSIVE METABOLIC PANEL
ALT: 39 U/L (ref 0–44)
AST: 34 U/L (ref 15–41)
Albumin: 4.5 g/dL (ref 3.5–5.0)
Alkaline Phosphatase: 104 U/L (ref 38–126)
Anion gap: 9 (ref 5–15)
BUN: 14 mg/dL (ref 6–20)
CO2: 23 mmol/L (ref 22–32)
Calcium: 9.5 mg/dL (ref 8.9–10.3)
Chloride: 105 mmol/L (ref 98–111)
Creatinine, Ser: 1.15 mg/dL — ABNORMAL HIGH (ref 0.44–1.00)
GFR, Estimated: >60 mL/min (ref >60)
Glucose, Bld: 112 mg/dL — ABNORMAL HIGH (ref 70–99)
Potassium: 4.2 mmol/L (ref 3.5–5.1)
Sodium: 137 mmol/L (ref 135–145)
Total Bilirubin: 0.8 mg/dL (ref 0.3–1.2)
Total Protein: 8.7 g/dL — ABNORMAL HIGH (ref 6.5–8.1)

## 2022-02-22 LAB — URINALYSIS, ROUTINE W REFLEX MICROSCOPIC
Bilirubin Urine: NEGATIVE
Glucose, UA: NEGATIVE mg/dL
Ketones, ur: NEGATIVE mg/dL
Nitrite: NEGATIVE
Protein, ur: 30 mg/dL — AB
Specific Gravity, Urine: 1.016 (ref 1.005–1.030)
WBC, UA: >50 WBC/hpf — ABNORMAL HIGH (ref 0–5)
pH: 5 (ref 5.0–8.0)

## 2022-02-22 LAB — CBC
HCT: 44.1 % (ref 36.0–46.0)
Hemoglobin: 13.9 g/dL (ref 12.0–15.0)
MCH: 27.2 pg (ref 26.0–34.0)
MCHC: 31.5 g/dL (ref 30.0–36.0)
MCV: 86.3 fL (ref 80.0–100.0)
Platelets: 250 10*3/uL (ref 150–400)
RBC: 5.11 MIL/uL (ref 3.87–5.11)
RDW: 16.9 % — ABNORMAL HIGH (ref 11.5–15.5)
WBC: 13.5 10*3/uL — ABNORMAL HIGH (ref 4.0–10.5)
nRBC: 0 % (ref 0.0–0.2)

## 2022-02-22 LAB — TYPE AND SCREEN
ABO/RH(D): A NEG
Antibody Screen: NEGATIVE

## 2022-02-22 LAB — LIPASE, BLOOD: Lipase: 33 U/L (ref 11–51)

## 2022-02-22 LAB — POC URINE PREG, ED: Preg Test, Ur: NEGATIVE

## 2022-02-22 MED ORDER — SODIUM CHLORIDE 0.9 % IV BOLUS
1000.0000 mL | Freq: Once | INTRAVENOUS | Status: AC
  Administered 2022-02-22: 1000 mL via INTRAVENOUS

## 2022-02-22 MED ORDER — IOHEXOL 300 MG/ML  SOLN
100.0000 mL | Freq: Once | INTRAMUSCULAR | Status: AC | PRN
  Administered 2022-02-22: 100 mL via INTRAVENOUS

## 2022-02-22 MED ORDER — CEPHALEXIN 500 MG PO CAPS: 500.0000 mg | ORAL_CAPSULE | Freq: Four times a day (QID) | ORAL | 0 refills | Status: DC

## 2022-02-22 MED ORDER — MORPHINE SULFATE (PF) 4 MG/ML IV SOLN
4.0000 mg | Freq: Once | INTRAVENOUS | Status: AC
  Administered 2022-02-22: 4 mg via INTRAVENOUS
  Filled 2022-02-22: qty 1

## 2022-02-22 MED ORDER — ONDANSETRON HCL 4 MG/2ML IJ SOLN
4.0000 mg | Freq: Once | INTRAMUSCULAR | Status: AC
  Administered 2022-02-22: 4 mg via INTRAVENOUS
  Filled 2022-02-22: qty 2

## 2022-02-22 MED ORDER — ONDANSETRON 4 MG PO TBDP: 4.0000 mg | ORAL_TABLET | Freq: Three times a day (TID) | ORAL | 0 refills | Status: DC | PRN

## 2022-02-22 MED ORDER — CEPHALEXIN 500 MG PO CAPS
500.0000 mg | ORAL_CAPSULE | Freq: Once | ORAL | Status: AC
Start: 2022-02-22 — End: 2022-02-22
  Administered 2022-02-22: 500 mg via ORAL
  Filled 2022-02-22: qty 1

## 2022-02-22 NOTE — ED Triage Notes (Signed)
Patient states abdominal pain, back pain with diarrhea. Patient states bright red rectal bleeding and has recently been constipated.  ?

## 2022-02-22 NOTE — ED Triage Notes (Addendum)
Pain in right flank area for 3 days, pain is intermittent ?

## 2022-02-22 NOTE — ED Provider Notes (Signed)
[facility] EMERGENCY DEPARTMENT Provider Note   CSN: [id number] Arrival date & time: 02/22/22  1701     History  Chief Complaint  Patient presents with   Abdominal Pain    Amber Colon is a 41 y.o. female with a past medical history significant for hypothyroidism, hypertension, anxiety, GERD, metabolic syndrome who presents to the ED due to abdominal pain.  Patient states pain started in her back 3 days ago and transition to her lower abdomen 1 day ago.  Abdominal pain associated with diarrhea and nausea.  No vomiting.  No fever.  Denies urinary vaginal symptoms.  Patient has had a previous cholecystectomy.  Denies concerns for STIs.  Patient also endorses some rectal bleeding.  She describes as bright red blood.     Home Medications Prior to Admission medications   Medication Sig Start Date End Date Taking? Authorizing Provider  cephALEXin (KEFLEX) 500 MG capsule Take 1 capsule (500 mg total) by mouth 4 (four) times daily. 02/22/22  Yes Scheryl Sanborn C, PA-C  ondansetron (ZOFRAN-ODT) 4 MG disintegrating tablet Take 1 tablet (4 mg total) by mouth every 8 (eight) hours as needed for nausea or vomiting. 02/22/22  Yes Augie Vane, Merla Riches, PA-C  colestipol (COLESTID) 1 g tablet Take 2 tablets (2 g total) by mouth 2 (two) times daily. 01/04/20   Raliegh Ip, DO  diclofenac Sodium (VOLTAREN) 1 % GEL Apply 2 g topically 4 (four) times daily. 01/08/20   Sonny Masters, FNP  FLUoxetine (PROZAC) 40 MG capsule Take 1 capsule (40 mg total) by mouth daily. 05/05/20   Raliegh Ip, DO  levothyroxine (SYNTHROID) 100 MCG tablet Take 1 tablet (100 mcg total) by mouth daily. 05/01/20 05/01/21  Junie Spencer, FNP  metroNIDAZOLE (FLAGYL) 500 MG tablet Take 1 tablet (500 mg total) by mouth 2 (two) times daily. 12/31/19   Raliegh Ip, DO  nystatin cream (MYCOSTATIN) Apply 1 application topically 2 (two) times daily. To the EXTERNAL vagina x10-14 days 12/31/19   Raliegh Ip, DO   pantoprazole (PROTONIX) 40 MG tablet Take 1 tablet (40 mg total) by mouth daily. (Needs to be seen before next refill) 07/14/20   Raliegh Ip, DO  predniSONE (DELTASONE) 20 MG tablet 2 po at sametime daily for 5 days 01/08/20   Sonny Masters, FNP      Allergies    Amoxicillin    Review of Systems   Review of Systems  Constitutional:  Negative for fever.  Respiratory:  Negative for shortness of breath.   Cardiovascular:  Negative for chest pain.  Gastrointestinal:  Positive for abdominal pain, diarrhea and nausea. Negative for vomiting.  Genitourinary:  Negative for dysuria and vaginal discharge.   Physical Exam Updated Vital Signs BP 118/88 (BP Location: Left Arm)    Pulse 88    Temp 98.3 F (36.8 C) (Oral)    Resp 16    Ht 5\' 6"  (1.676 m)    Wt (!) 161.5 kg    LMP 02/02/2022    SpO2 98%    BMI 57.46 kg/m  Physical Exam Vitals and nursing note reviewed.  Constitutional:      General: She is not in acute distress.    Appearance: She is not ill-appearing.  HENT:     Head: Normocephalic.  Eyes:     Pupils: Pupils are equal, round, and reactive to light.  Cardiovascular:     Rate and Rhythm: Normal rate and regular rhythm.  Pulses: Normal pulses.     Heart sounds: Normal heart sounds. No murmur heard.   No friction rub. No gallop.  Pulmonary:     Effort: Pulmonary effort is normal.     Breath sounds: Normal breath sounds.  Abdominal:     General: Abdomen is flat. There is no distension.     Palpations: Abdomen is soft.     Tenderness: There is abdominal tenderness. There is no guarding or rebound.     Comments: Lower abdominal tenderness with voluntary guarding.  No rebound.  Genitourinary:    Comments: Rectal exam performed with chaperone in room.  Numerous external hemorrhoids.  No evidence of thrombosis.  Unable to obtain stool sample due to inadequate stool. Musculoskeletal:        General: Normal range of motion.     Cervical back: Neck supple.  Skin:     General: Skin is warm and dry.  Neurological:     General: No focal deficit present.     Mental Status: She is alert.  Psychiatric:        Mood and Affect: Mood normal.        Behavior: Behavior normal.    ED Results / Procedures / Treatments   Labs (all labs ordered are listed, but only abnormal results are displayed) Labs Reviewed  COMPREHENSIVE METABOLIC PANEL - Abnormal; Notable for the following components:      Result Value   Glucose, Bld 112 (*)    Creatinine, Ser 1.15 (*)    Total Protein 8.7 (*)    All other components within normal limits  CBC - Abnormal; Notable for the following components:   WBC 13.5 (*)    RDW 16.9 (*)    All other components within normal limits  URINALYSIS, ROUTINE W REFLEX MICROSCOPIC - Abnormal; Notable for the following components:   APPearance HAZY (*)    Hgb urine dipstick MODERATE (*)    Protein, ur 30 (*)    Leukocytes,Ua MODERATE (*)    WBC, UA >50 (*)    Bacteria, UA FEW (*)    All other components within normal limits  URINE CULTURE  LIPASE, BLOOD  POC URINE PREG, ED  POC OCCULT BLOOD, ED  TYPE AND SCREEN    EKG None  Radiology CT ABDOMEN PELVIS W CONTRAST  Result Date: 02/22/2022 CLINICAL DATA:  Right lower quadrant pain EXAM: CT ABDOMEN AND PELVIS WITH CONTRAST TECHNIQUE: Multidetector CT imaging of the abdomen and pelvis was performed using the standard protocol following bolus administration of intravenous contrast. RADIATION DOSE REDUCTION: This exam was performed according to the departmental dose-optimization program which includes automated exposure control, adjustment of the mA and/or kV according to patient size and/or use of iterative reconstruction technique. CONTRAST:  100mL OMNIPAQUE IOHEXOL 300 MG/ML  SOLN COMPARISON:  02/09/2015 FINDINGS: Lower chest: No acute abnormality. Hepatobiliary: No focal liver abnormality is seen. Status post cholecystectomy. No biliary dilatation. Pancreas: Unremarkable. No pancreatic  ductal dilatation or surrounding inflammatory changes. Spleen: Normal in size without focal abnormality. Adrenals/Urinary Tract: Adrenal glands are normal. Cortical scarring within the kidneys. Subcentimeter hypodensities too small to further characterize. Bilobed cyst lower pole left kidney measuring up to 7 cm. The bladder is unremarkable. Stomach/Bowel: Stomach is within normal limits. Appendix appears normal. No evidence of bowel wall thickening, distention, or inflammatory changes. Vascular/Lymphatic: No significant vascular findings are present. No enlarged abdominal or pelvic lymph nodes. Reproductive: Uterus and bilateral adnexa are unremarkable. Other: Negative for pelvic effusion or free air  Musculoskeletal: No acute or significant osseous findings. IMPRESSION: No CT evidence for acute intra-abdominal or pelvic abnormality Electronically Signed   By: Jasmine Pang M.D.   On: 02/22/2022 21:59    Procedures Procedures    Medications Ordered in ED Medications  sodium chloride 0.9 % bolus 1,000 mL (0 mLs Intravenous Stopped 02/22/22 2214)  ondansetron (ZOFRAN) injection 4 mg (4 mg Intravenous Given 02/22/22 2016)  morphine (PF) 4 MG/ML injection 4 mg (4 mg Intravenous Given 02/22/22 2019)  iohexol (OMNIPAQUE) 300 MG/ML solution 100 mL (100 mLs Intravenous Contrast Given 02/22/22 2143)  cephALEXin (KEFLEX) capsule 500 mg (500 mg Oral Given 02/22/22 2214)    ED Course/ Medical Decision Making/ A&P Clinical Course as of 02/22/22 2253  Mon Feb 22, 2022  1836 WBC(!): 13.5 [CA]  1958 Creatinine(!): 1.15 [CA]  1958 Glucose(!): 112 [CA]  1958 Glori Luis): MODERATE [CA]  1958 Bacteria, UA(!): FEW [CA]  1958 WBC, UA(!): >50 [CA]  2038 Preg Test, Ur: NEGATIVE [CA]    Clinical Course User Index [CA] Mannie Stabile, PA-C                           Medical Decision Making Amount and/or Complexity of Data Reviewed Independent Historian: friend    Details: Friend at bedside provided some  history External Data Reviewed: notes. Labs: ordered. Decision-making details documented in ED Course. Radiology: ordered and independent interpretation performed. Decision-making details documented in ED Course.  Risk Prescription drug management.   42 year old female presents to the ED due to lower abdominal pain and back pain.  Patient notes back pain started 3 days ago which then transitioned to suprapubic region.  Patient denies urinary or vaginal symptoms.  No concern for STIs.  Patient also endorses some rectal bleeding.  Upon arrival, vitals all within normal limits.  Patient is afebrile, not tachycardic or hypoxic.  Patient in no acute distress.  Tenderness throughout lower quadrants of abdomen.  Rectal exam performed with chaperone in room.  Numerous external hemorrhoids without evidence of thrombosis.  Unable to obtain stool sample due to inadequate stool.  Routine labs ordered.  CT abdomen to rule out appendicitis and other intra-abdominal etiologies given lower quadrant tenderness.  IV fluids, Zofran, morphine given.  CBC significant for mild leukocytosis at 13.5.  Normal hemoglobin.  Lipase normal at 30.  Low suspicion for pancreatitis.  CMP significant for elevated creatinine at 1.15.  Hyperglycemia 112 without evidence of DKA.  No major electrolyte derangements.  UA significant for moderate hematuria.  Leukocytes and few bacteria.  No nitrites.  Urine culture pending. Given lower abdominal pain and back pain, will treat for UTI with keflex.  CT abdomen personally visualized which is negative for any acute abnormalities. Low suspicion for any pelvic etiology. Suspect rectal bleeding related to external hemorrhoids. Normal hemoglobin. Patient has remained hemodynamically stable throughout her entire ED stay. No evidence of large GI bleed.   Upon reassessment, patient notes improvement in symptoms. GI number given to patient at discharge and advised to call to schedule an appointment for  further evaluation of hemorrhoids. Patient discharged with zofran. Strict ED precautions discussed with patient. Patient states understanding and agrees to plan. Patient discharged home in no acute distress and stable vitals        Final Clinical Impression(s) / ED Diagnoses Final diagnoses:  Lower abdominal pain  Rectal bleeding  Acute cystitis with hematuria    Rx / DC Orders ED Discharge Orders  Ordered    cephALEXin (KEFLEX) 500 MG capsule  4 times daily        02/22/22 2207    ondansetron (ZOFRAN-ODT) 4 MG disintegrating tablet  Every 8 hours PRN        02/22/22 2207              Mannie Stabile, PA-C 02/22/22 2253    Derwood Kaplan, MD 02/23/22 [phone]

## 2022-02-22 NOTE — Discharge Instructions (Addendum)
It was a pleasure taking care of you today. As discussed, your labs were reassuring. Your urine showed possible signs of a UTI. I am sending you home with an antibiotic. Take as prescribed and finish all antibiotics. I suspect your rectal bleeding is from hemorrhoids.  I have included the number of the GI doctor.  Please call to schedule an appointment for further evaluation.  I am also sending you home with nausea medication.  Take as needed.  Please follow-up with PCP within 1 week for further evaluation.  Return to the ER for any worsening symptoms. ?

## 2022-02-23 ENCOUNTER — Telehealth: Payer: Self-pay

## 2022-02-23 NOTE — Telephone Encounter (Signed)
Transition Care Management Follow-up Telephone Call ?Date of discharge and from where: 02/22/2022-[place] ?How have you been since you were released from the hospital? Pt stated she is ok but still has some pain  ?Any questions or concerns? No ? ?Items Reviewed: ?Did the pt receive and understand the discharge instructions provided? Yes  ?Medications obtained and verified? Yes  ?Other? No  ?Any new allergies since your discharge? No  ?Dietary orders reviewed? No ?Do you have support at home? Yes  ? ?Home Care and Equipment/Supplies: ?Were home health services ordered? not applicable ?If so, what is the name of the agency? N/A  ?Has the agency set up a time to come to the patient's home? not applicable ?Were any new equipment or medical supplies ordered?  No ?What is the name of the medical supply agency? N/A ?Were you able to get the supplies/equipment? not applicable ?Do you have any questions related to the use of the equipment or supplies? No ? ?Functional Questionnaire: (I = Independent and D = Dependent) ?ADLs: I ? ?Bathing/Dressing- I ? ?Meal Prep- I ? ?Eating- I ? ?Maintaining continence- I ? ?Transferring/Ambulation- I ? ?Managing Meds- I ? ?Follow up appointments reviewed: ? ?PCP Hospital f/u appt confirmed? No  Specialist Hospital f/u appt confirmed? No   ?Are transportation arrangements needed? No  ?If their condition worsens, is the pt aware to call PCP or go to the Emergency Dept.? Yes ?Was the patient provided with contact information for the PCP's office or ED? Yes ?Was to pt encouraged to call back with questions or concerns? Yes  ?

## 2022-02-25 LAB — URINE CULTURE: Culture: 70000 — AB

## 2022-02-26 DIAGNOSIS — D500 Iron deficiency anemia secondary to blood loss (chronic): Secondary | ICD-10-CM | POA: Diagnosis not present

## 2022-03-01 DIAGNOSIS — K921 Melena: Secondary | ICD-10-CM | POA: Diagnosis not present

## 2022-03-01 DIAGNOSIS — Z6841 Body Mass Index (BMI) 40.0 and over, adult: Secondary | ICD-10-CM | POA: Diagnosis not present

## 2022-03-01 DIAGNOSIS — K2970 Gastritis, unspecified, without bleeding: Secondary | ICD-10-CM | POA: Diagnosis not present

## 2022-03-01 DIAGNOSIS — D500 Iron deficiency anemia secondary to blood loss (chronic): Secondary | ICD-10-CM | POA: Diagnosis not present

## 2022-03-01 DIAGNOSIS — R197 Diarrhea, unspecified: Secondary | ICD-10-CM | POA: Diagnosis not present

## 2022-03-01 DIAGNOSIS — K2990 Gastroduodenitis, unspecified, without bleeding: Secondary | ICD-10-CM | POA: Diagnosis not present

## 2022-03-17 DIAGNOSIS — D509 Iron deficiency anemia, unspecified: Secondary | ICD-10-CM | POA: Diagnosis not present

## 2022-03-17 DIAGNOSIS — R1032 Left lower quadrant pain: Secondary | ICD-10-CM | POA: Diagnosis not present

## 2022-03-17 DIAGNOSIS — R1031 Right lower quadrant pain: Secondary | ICD-10-CM | POA: Diagnosis not present

## 2022-03-17 DIAGNOSIS — R1011 Right upper quadrant pain: Secondary | ICD-10-CM | POA: Diagnosis not present

## 2022-03-17 DIAGNOSIS — K5900 Constipation, unspecified: Secondary | ICD-10-CM | POA: Diagnosis not present

## 2022-03-17 DIAGNOSIS — K625 Hemorrhage of anus and rectum: Secondary | ICD-10-CM | POA: Diagnosis not present

## 2022-03-20 DIAGNOSIS — R0789 Other chest pain: Secondary | ICD-10-CM | POA: Diagnosis not present

## 2022-03-20 DIAGNOSIS — I517 Cardiomegaly: Secondary | ICD-10-CM | POA: Diagnosis not present

## 2022-03-20 DIAGNOSIS — R079 Chest pain, unspecified: Secondary | ICD-10-CM | POA: Diagnosis not present

## 2022-03-20 DIAGNOSIS — M79602 Pain in left arm: Secondary | ICD-10-CM | POA: Diagnosis not present

## 2022-03-20 DIAGNOSIS — Z880 Allergy status to penicillin: Secondary | ICD-10-CM | POA: Diagnosis not present

## 2022-04-06 DIAGNOSIS — D500 Iron deficiency anemia secondary to blood loss (chronic): Secondary | ICD-10-CM | POA: Diagnosis not present

## 2022-04-15 DIAGNOSIS — M9901 Segmental and somatic dysfunction of cervical region: Secondary | ICD-10-CM | POA: Diagnosis not present

## 2022-04-15 DIAGNOSIS — M9903 Segmental and somatic dysfunction of lumbar region: Secondary | ICD-10-CM | POA: Diagnosis not present

## 2022-04-15 DIAGNOSIS — M62830 Muscle spasm of back: Secondary | ICD-10-CM | POA: Diagnosis not present

## 2022-04-15 DIAGNOSIS — M9902 Segmental and somatic dysfunction of thoracic region: Secondary | ICD-10-CM | POA: Diagnosis not present

## 2022-04-28 DIAGNOSIS — M9903 Segmental and somatic dysfunction of lumbar region: Secondary | ICD-10-CM | POA: Diagnosis not present

## 2022-04-28 DIAGNOSIS — M62830 Muscle spasm of back: Secondary | ICD-10-CM | POA: Diagnosis not present

## 2022-04-28 DIAGNOSIS — M9901 Segmental and somatic dysfunction of cervical region: Secondary | ICD-10-CM | POA: Diagnosis not present

## 2022-04-28 DIAGNOSIS — M9902 Segmental and somatic dysfunction of thoracic region: Secondary | ICD-10-CM | POA: Diagnosis not present

## 2022-04-29 DIAGNOSIS — M9901 Segmental and somatic dysfunction of cervical region: Secondary | ICD-10-CM | POA: Diagnosis not present

## 2022-04-29 DIAGNOSIS — M62830 Muscle spasm of back: Secondary | ICD-10-CM | POA: Diagnosis not present

## 2022-04-29 DIAGNOSIS — M9903 Segmental and somatic dysfunction of lumbar region: Secondary | ICD-10-CM | POA: Diagnosis not present

## 2022-04-29 DIAGNOSIS — M9902 Segmental and somatic dysfunction of thoracic region: Secondary | ICD-10-CM | POA: Diagnosis not present

## 2022-05-03 DIAGNOSIS — M9902 Segmental and somatic dysfunction of thoracic region: Secondary | ICD-10-CM | POA: Diagnosis not present

## 2022-05-03 DIAGNOSIS — M9901 Segmental and somatic dysfunction of cervical region: Secondary | ICD-10-CM | POA: Diagnosis not present

## 2022-05-03 DIAGNOSIS — M62830 Muscle spasm of back: Secondary | ICD-10-CM | POA: Diagnosis not present

## 2022-05-03 DIAGNOSIS — M9903 Segmental and somatic dysfunction of lumbar region: Secondary | ICD-10-CM | POA: Diagnosis not present

## 2022-05-06 DIAGNOSIS — I10 Essential (primary) hypertension: Secondary | ICD-10-CM | POA: Diagnosis not present

## 2022-05-06 DIAGNOSIS — K200 Eosinophilic esophagitis: Secondary | ICD-10-CM | POA: Diagnosis not present

## 2022-05-06 DIAGNOSIS — D509 Iron deficiency anemia, unspecified: Secondary | ICD-10-CM | POA: Diagnosis not present

## 2022-05-06 DIAGNOSIS — Z87891 Personal history of nicotine dependence: Secondary | ICD-10-CM | POA: Diagnosis not present

## 2022-05-06 DIAGNOSIS — K648 Other hemorrhoids: Secondary | ICD-10-CM | POA: Diagnosis not present

## 2022-05-06 DIAGNOSIS — Z6841 Body Mass Index (BMI) 40.0 and over, adult: Secondary | ICD-10-CM | POA: Diagnosis not present

## 2022-05-06 DIAGNOSIS — G4730 Sleep apnea, unspecified: Secondary | ICD-10-CM | POA: Diagnosis not present

## 2022-05-06 DIAGNOSIS — R1310 Dysphagia, unspecified: Secondary | ICD-10-CM | POA: Diagnosis not present

## 2022-05-06 DIAGNOSIS — K625 Hemorrhage of anus and rectum: Secondary | ICD-10-CM | POA: Diagnosis not present

## 2022-05-07 DIAGNOSIS — K2289 Other specified disease of esophagus: Secondary | ICD-10-CM | POA: Diagnosis not present

## 2022-05-10 DIAGNOSIS — M9902 Segmental and somatic dysfunction of thoracic region: Secondary | ICD-10-CM | POA: Diagnosis not present

## 2022-05-10 DIAGNOSIS — M9901 Segmental and somatic dysfunction of cervical region: Secondary | ICD-10-CM | POA: Diagnosis not present

## 2022-05-10 DIAGNOSIS — M62830 Muscle spasm of back: Secondary | ICD-10-CM | POA: Diagnosis not present

## 2022-05-10 DIAGNOSIS — M9903 Segmental and somatic dysfunction of lumbar region: Secondary | ICD-10-CM | POA: Diagnosis not present

## 2022-05-28 DIAGNOSIS — N920 Excessive and frequent menstruation with regular cycle: Secondary | ICD-10-CM | POA: Diagnosis not present

## 2022-05-28 DIAGNOSIS — K2970 Gastritis, unspecified, without bleeding: Secondary | ICD-10-CM | POA: Diagnosis not present

## 2022-05-28 DIAGNOSIS — I10 Essential (primary) hypertension: Secondary | ICD-10-CM | POA: Diagnosis not present

## 2022-05-28 DIAGNOSIS — E039 Hypothyroidism, unspecified: Secondary | ICD-10-CM | POA: Diagnosis not present

## 2022-05-28 DIAGNOSIS — Z0000 Encounter for general adult medical examination without abnormal findings: Secondary | ICD-10-CM | POA: Diagnosis not present

## 2022-05-28 DIAGNOSIS — F411 Generalized anxiety disorder: Secondary | ICD-10-CM | POA: Diagnosis not present

## 2022-05-28 DIAGNOSIS — Z13220 Encounter for screening for lipoid disorders: Secondary | ICD-10-CM | POA: Diagnosis not present

## 2022-05-28 DIAGNOSIS — R197 Diarrhea, unspecified: Secondary | ICD-10-CM | POA: Diagnosis not present

## 2022-05-28 DIAGNOSIS — K2990 Gastroduodenitis, unspecified, without bleeding: Secondary | ICD-10-CM | POA: Diagnosis not present

## 2022-05-28 DIAGNOSIS — F331 Major depressive disorder, recurrent, moderate: Secondary | ICD-10-CM | POA: Diagnosis not present

## 2022-05-28 DIAGNOSIS — D500 Iron deficiency anemia secondary to blood loss (chronic): Secondary | ICD-10-CM | POA: Diagnosis not present

## 2022-06-08 DIAGNOSIS — E039 Hypothyroidism, unspecified: Secondary | ICD-10-CM | POA: Diagnosis not present

## 2022-06-08 DIAGNOSIS — K200 Eosinophilic esophagitis: Secondary | ICD-10-CM | POA: Diagnosis not present

## 2022-06-08 DIAGNOSIS — Z6841 Body Mass Index (BMI) 40.0 and over, adult: Secondary | ICD-10-CM | POA: Diagnosis not present

## 2022-06-08 DIAGNOSIS — N920 Excessive and frequent menstruation with regular cycle: Secondary | ICD-10-CM | POA: Diagnosis not present

## 2022-06-08 DIAGNOSIS — D500 Iron deficiency anemia secondary to blood loss (chronic): Secondary | ICD-10-CM | POA: Diagnosis not present

## 2022-08-05 ENCOUNTER — Encounter (HOSPITAL_COMMUNITY): Payer: Self-pay | Admitting: Emergency Medicine

## 2022-08-05 ENCOUNTER — Emergency Department (HOSPITAL_COMMUNITY): Payer: Medicaid Other

## 2022-08-05 ENCOUNTER — Observation Stay (HOSPITAL_COMMUNITY)
Admission: EM | Admit: 2022-08-05 | Discharge: 2022-08-05 | Discharge status: Against Medical Advice | Payer: Medicaid Other | Attending: Student | Admitting: Student

## 2022-08-05 ENCOUNTER — Other Ambulatory Visit: Payer: Self-pay

## 2022-08-05 DIAGNOSIS — I10 Essential (primary) hypertension: Secondary | ICD-10-CM | POA: Diagnosis not present

## 2022-08-05 DIAGNOSIS — R200 Anesthesia of skin: Secondary | ICD-10-CM | POA: Diagnosis present

## 2022-08-05 DIAGNOSIS — E039 Hypothyroidism, unspecified: Secondary | ICD-10-CM | POA: Insufficient documentation

## 2022-08-05 DIAGNOSIS — Z79899 Other long term (current) drug therapy: Secondary | ICD-10-CM | POA: Diagnosis not present

## 2022-08-05 DIAGNOSIS — F17210 Nicotine dependence, cigarettes, uncomplicated: Secondary | ICD-10-CM | POA: Diagnosis not present

## 2022-08-05 LAB — DIFFERENTIAL
Abs Immature Granulocytes: 0.02 10*3/uL (ref 0.00–0.07)
Basophils Absolute: 0.1 10*3/uL (ref 0.0–0.1)
Basophils Relative: 1 %
Eosinophils Absolute: 0.4 10*3/uL (ref 0.0–0.5)
Eosinophils Relative: 6 %
Immature Granulocytes: 0 %
Lymphocytes Relative: 36 %
Lymphs Abs: 2.6 10*3/uL (ref 0.7–4.0)
Monocytes Absolute: 0.4 10*3/uL (ref 0.1–1.0)
Monocytes Relative: 6 %
Neutro Abs: 3.7 10*3/uL (ref 1.7–7.7)
Neutrophils Relative %: 51 %

## 2022-08-05 LAB — COMPREHENSIVE METABOLIC PANEL
ALT: 22 U/L (ref 0–44)
AST: 22 U/L (ref 15–41)
Albumin: 4.1 g/dL (ref 3.5–5.0)
Alkaline Phosphatase: 70 U/L (ref 38–126)
Anion gap: 8 (ref 5–15)
BUN: 11 mg/dL (ref 6–20)
CO2: 24 mmol/L (ref 22–32)
Calcium: 9.1 mg/dL (ref 8.9–10.3)
Chloride: 105 mmol/L (ref 98–111)
Creatinine, Ser: 0.95 mg/dL (ref 0.44–1.00)
GFR, Estimated: >60 mL/min (ref >60)
Glucose, Bld: 93 mg/dL (ref 70–99)
Potassium: 3.6 mmol/L (ref 3.5–5.1)
Sodium: 137 mmol/L (ref 135–145)
Total Bilirubin: 1.2 mg/dL (ref 0.3–1.2)
Total Protein: 7.5 g/dL (ref 6.5–8.1)

## 2022-08-05 LAB — PROTIME-INR
INR: 1.1 (ref 0.8–1.2)
Prothrombin Time: 13.6 seconds (ref 11.4–15.2)

## 2022-08-05 LAB — CBC
HCT: 41.8 % (ref 36.0–46.0)
Hemoglobin: 13.8 g/dL (ref 12.0–15.0)
MCH: 29.2 pg (ref 26.0–34.0)
MCHC: 33 g/dL (ref 30.0–36.0)
MCV: 88.4 fL (ref 80.0–100.0)
Platelets: 205 10*3/uL (ref 150–400)
RBC: 4.73 MIL/uL (ref 3.87–5.11)
RDW: 15 % (ref 11.5–15.5)
WBC: 7.2 10*3/uL (ref 4.0–10.5)
nRBC: 0 % (ref 0.0–0.2)

## 2022-08-05 LAB — POC URINE PREG, ED: Preg Test, Ur: NEGATIVE

## 2022-08-05 LAB — ETHANOL: Alcohol, Ethyl (B): <10 mg/dL (ref <10)

## 2022-08-05 LAB — APTT: aPTT: 32 seconds (ref 24–36)

## 2022-08-05 MED ORDER — LORAZEPAM 2 MG/ML IJ SOLN: 1.0000 mg | Freq: Four times a day (QID) | INTRAMUSCULAR | Status: DC | PRN

## 2022-08-05 MED ORDER — GADOBUTROL 1 MMOL/ML IV SOLN
10.0000 mL | Freq: Once | INTRAVENOUS | Status: AC | PRN
Start: 2022-08-05 — End: 2022-08-05
  Administered 2022-08-05: 10 mL via INTRAVENOUS

## 2022-08-05 MED ORDER — PROCHLORPERAZINE EDISYLATE 10 MG/2ML IJ SOLN
10.0000 mg | Freq: Once | INTRAMUSCULAR | Status: AC
  Administered 2022-08-05: 10 mg via INTRAVENOUS
  Filled 2022-08-05: qty 2

## 2022-08-05 MED ORDER — LORAZEPAM 2 MG/ML IJ SOLN
1.0000 mg | Freq: Once | INTRAMUSCULAR | Status: AC
  Administered 2022-08-05: 1 mg via INTRAVENOUS
  Filled 2022-08-05: qty 1

## 2022-08-05 MED ORDER — DIPHENHYDRAMINE HCL 50 MG/ML IJ SOLN
25.0000 mg | Freq: Once | INTRAMUSCULAR | Status: AC
  Administered 2022-08-05: 25 mg via INTRAVENOUS
  Filled 2022-08-05: qty 1

## 2022-08-05 MED ORDER — SODIUM CHLORIDE 0.9% FLUSH
3.0000 mL | Freq: Once | INTRAVENOUS | Status: AC
  Administered 2022-08-05: 3 mL via INTRAVENOUS

## 2022-08-05 MED ORDER — ONDANSETRON 4 MG PO TBDP: 4.0000 mg | ORAL_TABLET | Freq: Once | ORAL | Status: DC

## 2022-08-05 NOTE — ED Notes (Signed)
Pt stated that she does not wish to have any more MRI's done. Lorin Glass, MD made aware via secure chat message.

## 2022-08-05 NOTE — ED Notes (Signed)
Patient transported to MRI 

## 2022-08-05 NOTE — ED Triage Notes (Signed)
Pt has left upper tooth dental pain, causing left-side facial numbness, that started on Monday.

## 2022-08-05 NOTE — ED Notes (Signed)
Orders for cardiac monitoring have been placed, however there are no available rooms with cardiac monitoring equipment in them. Charge RN made aware. Pt in no acute distress at this time, denies any needs.

## 2022-08-05 NOTE — ED Notes (Addendum)
Pt came to nurses station and asked for her IV to be removed so that she could go home. This RN reiterated the importance of staying over night for observation and testing in the morning. Pt states she understands the risks, still wants to leave. Meredeth Ide, [place] made aware. [place], MD also made aware of pt leaving AMA.

## 2022-08-05 NOTE — ED Provider Notes (Signed)
[facility] EMERGENCY DEPARTMENT Provider Note   CSN: [id number] Arrival date & time: 08/05/22  1234     History Chief Complaint  Patient presents with   Facial Numbness    Amber Colon is a 41 y.o. female patient who presents to the emergency department with left-sided facial numbness and left arm numbness that is been ongoing for 4 days.  Patient also endorses some intermittent right-sided dental pain.  None currently.  Numbness has been constant since onset.  She denies any headache, weakness or numbness to lower extremities, slurred speech, trouble walking.  No chest pain or shortness of breath.   Dental Pain      Home Medications Prior to Admission medications   Medication Sig Start Date End Date Taking? Authorizing Provider  cephALEXin (KEFLEX) 500 MG capsule Take 1 capsule (500 mg total) by mouth 4 (four) times daily. 02/22/22   Mannie Stabile, PA-C  colestipol (COLESTID) 1 g tablet Take 2 tablets (2 g total) by mouth 2 (two) times daily. 01/04/20   Raliegh Ip, DO  diclofenac Sodium (VOLTAREN) 1 % GEL Apply 2 g topically 4 (four) times daily. 01/08/20   Sonny Masters, FNP  FLUoxetine (PROZAC) 40 MG capsule Take 1 capsule (40 mg total) by mouth daily. 05/05/20   Raliegh Ip, DO  levothyroxine (SYNTHROID) 100 MCG tablet Take 1 tablet (100 mcg total) by mouth daily. 05/01/20 05/01/21  Junie Spencer, FNP  metroNIDAZOLE (FLAGYL) 500 MG tablet Take 1 tablet (500 mg total) by mouth 2 (two) times daily. 12/31/19   Raliegh Ip, DO  nystatin cream (MYCOSTATIN) Apply 1 application topically 2 (two) times daily. To the EXTERNAL vagina x10-14 days 12/31/19   Delynn Flavin M, DO  ondansetron (ZOFRAN-ODT) 4 MG disintegrating tablet Take 1 tablet (4 mg total) by mouth every 8 (eight) hours as needed for nausea or vomiting. 02/22/22   Mannie Stabile, PA-C  pantoprazole (PROTONIX) 40 MG tablet Take 1 tablet (40 mg total) by mouth daily. (Needs to be seen  before next refill) 07/14/20   Raliegh Ip, DO  predniSONE (DELTASONE) 20 MG tablet 2 po at sametime daily for 5 days 01/08/20   Sonny Masters, FNP      Allergies    Amoxicillin    Review of Systems   Review of Systems  All other systems reviewed and are negative.   Physical Exam Updated Vital Signs BP 124/75 (BP Location: Left Arm)   Pulse 69   Temp 98.3 F (36.8 C) (Oral)   Resp (!) 22   Ht 5\' 6"  (1.676 m)   Wt (!) 156.5 kg   LMP 07/13/2022 (Approximate)   SpO2 99%   BMI 55.68 kg/m  Physical Exam Vitals and nursing note reviewed.  Constitutional:      General: She is not in acute distress.    Appearance: Normal appearance.  HENT:     Head: Normocephalic and atraumatic.  Eyes:     General:        Right eye: No discharge.        Left eye: No discharge.  Cardiovascular:     Comments: Regular rate and rhythm.  S1/S2 are distinct without any evidence of murmur, rubs, or gallops.  Radial pulses are 2+ bilaterally.  Dorsalis pedis pulses are 2+ bilaterally.  No evidence of pedal edema. Pulmonary:     Comments: Clear to auscultation bilaterally.  Normal effort.  No respiratory distress.  No evidence of wheezes, rales,  or rhonchi heard throughout. Abdominal:     General: Abdomen is flat. Bowel sounds are normal. There is no distension.     Tenderness: There is no abdominal tenderness. There is no guarding or rebound.  Musculoskeletal:        General: Normal range of motion.     Cervical back: Neck supple.  Skin:    General: Skin is warm and dry.     Findings: No rash.  Neurological:     General: No focal deficit present.     Mental Status: She is alert.     Comments: Cranial nerves II through XII are intact apart from the trigeminal nerve where she has decree subjective sensation along V1 through V3 distribution.  Decrease subjective sensation in the left arm.  Slight decreased grip strength in the left in comparison to the right.  5/5 strength and sensation in the  lower extremities.  No evidence of slurred speech.  Answers all my questions appropriately.  Psychiatric:        Mood and Affect: Mood normal.        Behavior: Behavior normal.     ED Results / Procedures / Treatments   Labs (all labs ordered are listed, but only abnormal results are displayed) Labs Reviewed  PROTIME-INR  APTT  CBC  DIFFERENTIAL  COMPREHENSIVE METABOLIC PANEL  ETHANOL  CBG MONITORING, ED  POC URINE PREG, ED    EKG None  Radiology MR Brain W and Wo Contrast  Result Date: 08/05/2022 CLINICAL DATA:  Acute neuro deficit EXAM: MRI HEAD WITHOUT AND WITH CONTRAST MRA HEAD WITHOUT CONTRAST TECHNIQUE: Multiplanar, multi-echo pulse sequences of the brain and surrounding structures were acquired without and with intravenous contrast. Angiographic images of the Circle of Willis were acquired using MRA technique without intravenous contrast. CONTRAST:  62mL GADAVIST GADOBUTROL 1 MMOL/ML IV SOLN COMPARISON:  CT head 08/05/2022 FINDINGS: MRI HEAD FINDINGS Brain: No acute infarction, hemorrhage, hydrocephalus, extra-axial collection or mass lesion. No pathologic intracranial enhancement. Small periventricular white matter hyperintensities bilaterally. Brainstem and posterior fossa normal. Normal enhancement. Vascular: Normal arterial flow voids Skull and upper cervical spine: No focal skeletal lesion. Sinuses/Orbits: Paranasal sinuses clear.  Negative orbit Other: None MRA HEAD FINDINGS Anterior circulation: Internal carotid artery widely patent bilaterally. Anterior and middle cerebral arteries normal bilaterally. Posterior circulation: Both vertebral arteries patent to the basilar. Basilar widely patent. Superior cerebellar and posterior cerebral arteries patent bilaterally. Anatomic variants: Negative for aneurysm IMPRESSION: Negative for acute infarct. Several small periventricular white matter hyperintensities. Distribution raises the possibility of multiple sclerosis. Chronic small  vessel ischemia also in the differential Negative MRA head Electronically Signed   By: Marlan Palau M.D.   On: 08/05/2022 18:58   MR ANGIO HEAD WO CONTRAST  Result Date: 08/05/2022 CLINICAL DATA:  Acute neuro deficit EXAM: MRI HEAD WITHOUT AND WITH CONTRAST MRA HEAD WITHOUT CONTRAST TECHNIQUE: Multiplanar, multi-echo pulse sequences of the brain and surrounding structures were acquired without and with intravenous contrast. Angiographic images of the Circle of Willis were acquired using MRA technique without intravenous contrast. CONTRAST:  31mL GADAVIST GADOBUTROL 1 MMOL/ML IV SOLN COMPARISON:  CT head 08/05/2022 FINDINGS: MRI HEAD FINDINGS Brain: No acute infarction, hemorrhage, hydrocephalus, extra-axial collection or mass lesion. No pathologic intracranial enhancement. Small periventricular white matter hyperintensities bilaterally. Brainstem and posterior fossa normal. Normal enhancement. Vascular: Normal arterial flow voids Skull and upper cervical spine: No focal skeletal lesion. Sinuses/Orbits: Paranasal sinuses clear.  Negative orbit Other: None MRA HEAD FINDINGS Anterior circulation: Internal  carotid artery widely patent bilaterally. Anterior and middle cerebral arteries normal bilaterally. Posterior circulation: Both vertebral arteries patent to the basilar. Basilar widely patent. Superior cerebellar and posterior cerebral arteries patent bilaterally. Anatomic variants: Negative for aneurysm IMPRESSION: Negative for acute infarct. Several small periventricular white matter hyperintensities. Distribution raises the possibility of multiple sclerosis. Chronic small vessel ischemia also in the differential Negative MRA head Electronically Signed   By: Marlan Palau M.D.   On: 08/05/2022 18:58   CT HEAD WO CONTRAST  Result Date: 08/05/2022 CLINICAL DATA:  Acute neuro deficit.  Left facial numbness EXAM: CT HEAD WITHOUT CONTRAST TECHNIQUE: Contiguous axial images were obtained from the base of the  skull through the vertex without intravenous contrast. RADIATION DOSE REDUCTION: This exam was performed according to the departmental dose-optimization program which includes automated exposure control, adjustment of the mA and/or kV according to patient size and/or use of iterative reconstruction technique. COMPARISON:  CT temporal bone 10/02/2015 FINDINGS: Brain: No evidence of acute infarction, hemorrhage, hydrocephalus, extra-axial collection or mass lesion/mass effect. Vascular: Negative for hyperdense vessel Skull: Negative Sinuses/Orbits: Paranasal sinuses clear. Bilateral mastoidectomy. Mastoidectomy cavity clear. [place] appears to been removed. Negative orbit Other: None IMPRESSION: Negative CT of the brain Bilateral mastoidectomy. Electronically Signed   By: Marlan Palau M.D.   On: 08/05/2022 15:31    Procedures Procedures    Medications Ordered in ED Medications  ondansetron (ZOFRAN-ODT) disintegrating tablet 4 mg (4 mg Oral Not Given 08/05/22 1338)  sodium chloride flush (NS) 0.9 % injection 3 mL (3 mLs Intravenous Given 08/05/22 1611)  prochlorperazine (COMPAZINE) injection 10 mg (10 mg Intravenous Given 08/05/22 1701)  diphenhydrAMINE (BENADRYL) injection 25 mg (25 mg Intravenous Given 08/05/22 1702)  gadobutrol (GADAVIST) 1 MMOL/ML injection 10 mL (10 mLs Intravenous Contrast Given 08/05/22 1820)  LORazepam (ATIVAN) injection 1 mg (1 mg Intravenous Given 08/05/22 2019)    ED Course/ Medical Decision Making/ A&P Clinical Course as of 08/05/22 2023  Thu Aug 05, 2022  1939 Dr. Selina Cooley with neurology recommends admission with MRI C-spine and T-spine tomorrow and contacting Dr. Melynda Ripple tomorrow.  She does not recommend starting empirical steroids at this time. [CF]  1939 CBC Normal. [CF]  1939 Comprehensive metabolic panel Normal. [CF]  1939 Ethanol Negative. [CF]  1939 Protime-INR Normal. [CF]  1939 APTT Normal. [CF]  1939 POC urine preg, ED Negative. [CF]  1940 CT HEAD WO  CONTRAST Personally ordered interpret this study.  No evidence of intracranial hemorrhage.  I do agree with radiologist interpretation. [CF]  1940 MRI and MRA of the head with and without contrast did not reveal any significant signs of stroke but there is concern for possible multiple sclerosis.  I do agree with the radiologist interpretation. [CF]  2009 I spoke with Dr. Pola Corn with [facility] who agrees to admit the patient.  [CF]    Clinical Course User Index [CF] Teressa Lower, PA-C                           Medical Decision Making Amber Colon is a 41 y.o. female patient who presents to the emergency department for further evaluation of left-sided facial numbness and left arm numbness.  This is been ongoing for 4 to 5 days constantly.  I am concerned for possible stroke or other intracranial pathology.  Patient is well with outside the window and is not a tPA candidate.  I will get stroke screening labs and get a  CT head without contrast and plan to reassess.  I reviewed all of the labs and imaging with the patient at the bedside.  Given the findings of the MRI I spoke with neurology who would like to get her admitted for further evaluation.  Think this is a reasonable plan considering the possibility of MS.  Patient also amenable this plan.  I will work on getting her admitted to the hospitalist service.   Amount and/or Complexity of Data Reviewed Labs: ordered. Decision-making details documented in ED Course. Radiology: ordered. Decision-making details documented in ED Course.  Risk Prescription drug management. Decision regarding hospitalization.    Final Clinical Impression(s) / ED Diagnoses Final diagnoses:  Facial numbness  Arm numbness left    Rx / DC Orders ED Discharge Orders     None         Jolyn Lent 08/05/22 2023    Glendora Score, MD 08/06/22 1836

## 2022-08-05 NOTE — ED Notes (Signed)
Attempted to call report X 1. 

## 2022-08-05 NOTE — H&P (Signed)
[facility] History and Physical  Amber Colon [id number] DOB: 11/09/1981 DOA: 08/05/2022 PCP: Jettie Booze, NP  Admitted from: Home Chief Complaint: Facial numbness  History of Present Illness: Amber Colon is a 41 y.o. female with PMH significant for morbid obesity, hypothyroidism, GERD, anxiety/depression, panic attacks. Patient presented to the ED from home today with complaint of left-sided facial numbness that started on 8/14.  Patient thought that it could be secondary to left upper tooth dental pain but because of the persistence of symptoms, she decided to come to ED today.  She also reports associated left arm numbness.  No other focal motor deficits or slurred speech  In the ED, patient was afebrile, heart rate in 70s, blood pressure was elevated as high as 151/74, breathing on room air CBC, CMP unremarkable Urine pregnancy test negative CT head unremarkable MRI brain negative for any acute infarct.   MRA head showed several small periventricular white matter hyperintensities, raising suspicion of multiple sclerosis. EDP discussed the case with neurologist on-call Dr. Quinn Axe.  Recommended to obtain MRI cervical and thoracic spine and an inpatient evaluation by neurology in the morning.  Please formally consult in the morning. Hospitalist service called for observation overnight.  At the time of my evaluation, patient was propped up in a recliner.  Not in distress.  Family member at bedside.  Review of Systems:  All systems were reviewed and were negative unless otherwise mentioned in the HPI   Past medical history: Past Medical History:  Diagnosis Date   Anemia    not taking her iron pills either   Anxiety    Depression    Gallstones    GERD (gastroesophageal reflux disease)    Headache    "occasional"   History of blood transfusion    blood after miscarrige   Hypertension    doesn't take her bp pills   Hypothyroidism    again, takes no med    Morbid obesity ([place]) 01/16/2019   Obesity    Panic attack     Past surgical history: Past Surgical History:  Procedure Laterality Date   CHOLECYSTECTOMY N/A 09/08/2016   Procedure: LAPAROSCOPIC CHOLECYSTECTOMY WITH INTRAOPERATIVE CHOLANGIOGRAM;  Surgeon: Autumn Messing III, MD;  Location: [facility];  Service: General;  Laterality: N/A;   DILATION AND CURETTAGE OF UTERUS     ESOPHAGOGASTRODUODENOSCOPY (EGD) WITH PROPOFOL N/A 02/23/2018   Procedure: ESOPHAGOGASTRODUODENOSCOPY (EGD) WITH PROPOFOL;  Surgeon: Milus Banister, MD;  Location: [facility];  Service: Endoscopy;  Laterality: N/A;   TUBAL LIGATION     TYMPANOMASTOIDECTOMY Left 12/24/2015   Procedure: LEFT MODIFIED  RADICAL TYMPANOMASTOIDECTOMY;  Surgeon: Leta Baptist, MD;  Location: [facility];  Service: ENT;  Laterality: Left;   TYMPANOMASTOIDECTOMY Right 07/07/2016   Procedure: RIGHT TYMPANOMASTOIDECTOMY;  Surgeon: Leta Baptist, MD;  Location: [facility];  Service: ENT;  Laterality: Right;    Social History:  reports that she has been smoking cigarettes. She has a 2.75 pack-year smoking history. She has never used smokeless tobacco. She reports that she does not drink alcohol and does not use drugs.  Allergies:  Allergies  Allergen Reactions   Amoxicillin Diarrhea, Nausea And Vomiting and Other (See Comments)    Has patient had a PCN reaction causing immediate rash, facial/tongue/throat swelling, SOB or lightheadedness with hypotension: No Has patient had a PCN reaction causing severe rash involving mucus membranes or skin necrosis: No Has patient had a PCN reaction that required hospitalization: No Has patient had a PCN reaction occurring  within the last 10 years: No If all of the above answers are "NO", then may proceed with Cephalosporin use.    Amoxicillin   Family history:  Family History  Problem Relation Age of Onset   Diabetes Mother    Heart disease Mother    Ovarian cancer Mother    Stroke Mother    Hypertension Mother    Diabetes Father     COPD Father    Heart disease Father    Anuerysm Father        Neck, stomach   Breast cancer Maternal Grandmother 13   Diabetes Maternal Grandfather    Diabetes Paternal Aunt    Diabetes Paternal Uncle      Home Meds: Prior to Admission medications   Medication Sig Start Date End Date Taking? Authorizing Provider  cephALEXin (KEFLEX) 500 MG capsule Take 1 capsule (500 mg total) by mouth 4 (four) times daily. 02/22/22   Mannie Stabile, PA-C  colestipol (COLESTID) 1 g tablet Take 2 tablets (2 g total) by mouth 2 (two) times daily. 01/04/20   Raliegh Ip, DO  diclofenac Sodium (VOLTAREN) 1 % GEL Apply 2 g topically 4 (four) times daily. 01/08/20   Sonny Masters, FNP  FLUoxetine (PROZAC) 40 MG capsule Take 1 capsule (40 mg total) by mouth daily. 05/05/20   Raliegh Ip, DO  levothyroxine (SYNTHROID) 100 MCG tablet Take 1 tablet (100 mcg total) by mouth daily. 05/01/20 05/01/21  Junie Spencer, FNP  metroNIDAZOLE (FLAGYL) 500 MG tablet Take 1 tablet (500 mg total) by mouth 2 (two) times daily. 12/31/19   Raliegh Ip, DO  nystatin cream (MYCOSTATIN) Apply 1 application topically 2 (two) times daily. To the EXTERNAL vagina x10-14 days 12/31/19   Delynn Flavin M, DO  ondansetron (ZOFRAN-ODT) 4 MG disintegrating tablet Take 1 tablet (4 mg total) by mouth every 8 (eight) hours as needed for nausea or vomiting. 02/22/22   Mannie Stabile, PA-C  pantoprazole (PROTONIX) 40 MG tablet Take 1 tablet (40 mg total) by mouth daily. (Needs to be seen before next refill) 07/14/20   Raliegh Ip, DO  predniSONE (DELTASONE) 20 MG tablet 2 po at sametime daily for 5 days 01/08/20   Sonny Masters, FNP    Physical Exam: Vitals:   08/05/22 1706 08/05/22 1907 08/05/22 2008 08/05/22 2021  BP: (!) 151/74 135/84  124/75  Pulse: 62 79  69  Resp: 19 19  (!) 22  Temp:   98.3 F (36.8 C)   TempSrc:   Oral   SpO2: 100% 100%  99%  Weight:      Height:       Wt Readings from Last  3 Encounters:  08/05/22 (!) 156.5 kg  02/22/22 (!) 161.5 kg  04/18/21 (!) 170.1 kg   Body mass index is 55.68 kg/m.  General exam: Pleasant, morbidly obese female.  Not in physical distress Skin: No rashes, lesions or ulcers. HEENT: Atraumatic, normocephalic, no obvious bleeding Lungs: Clear to auscultation bilaterally CVS: Regular rate and rhythm, no murmur GI/Abd soft, distended from obesity, nontender, bowel sound present CNS: Alert, awake, oriented x3, left-sided facial numbness present Psychiatry: Sad affect Extremities: Bilateral lower extremity chronic lymphedema     Consult Orders  (From admission, onward)           Start     Ordered   08/05/22 1939  Consult to hospitalist  Once       Provider:  (Not yet assigned)  Question Answer Comment  Place call to: [facility]   Reason for Consult Admit      08/05/22 1938            Labs on Admission:   CBC: Recent Labs  Lab 08/05/22 1515  WBC 7.2  NEUTROABS 3.7  HGB 13.8  HCT 41.8  MCV 88.4  PLT 99991111    Basic Metabolic Panel: Recent Labs  Lab 08/05/22 1515  NA 137  K 3.6  CL 105  CO2 24  GLUCOSE 93  BUN 11  CREATININE 0.95  CALCIUM 9.1    Liver Function Tests: Recent Labs  Lab 08/05/22 1515  AST 22  ALT 22  ALKPHOS 70  BILITOT 1.2  PROT 7.5  ALBUMIN 4.1   No results for input(s): "LIPASE", "AMYLASE" in the last 168 hours. No results for input(s): "AMMONIA" in the last 168 hours.  Cardiac Enzymes: No results for input(s): "CKTOTAL", "CKMB", "CKMBINDEX", "TROPONINI" in the last 168 hours.  BNP (last 3 results) No results for input(s): "BNP" in the last 8760 hours.  ProBNP (last 3 results) No results for input(s): "PROBNP" in the last 8760 hours.  CBG: No results for input(s): "GLUCAP" in the last 168 hours.  Lipase     Component Value Date/Time   LIPASE 33 02/22/2022 1842     Urinalysis    Component Value Date/Time   COLORURINE YELLOW 02/22/2022 1837    APPEARANCEUR HAZY (A) 02/22/2022 1837   LABSPEC 1.016 02/22/2022 1837   PHURINE 5.0 02/22/2022 1837   GLUCOSEU NEGATIVE 02/22/2022 1837   HGBUR MODERATE (A) 02/22/2022 1837   BILIRUBINUR NEGATIVE 02/22/2022 1837   KETONESUR NEGATIVE 02/22/2022 1837   PROTEINUR 30 (A) 02/22/2022 1837   UROBILINOGEN 1.0 02/09/2015 1245   NITRITE NEGATIVE 02/22/2022 1837   LEUKOCYTESUR MODERATE (A) 02/22/2022 1837     Drugs of Abuse  No results found for: "LABOPIA", "COCAINSCRNUR", "LABBENZ", "AMPHETMU", "THCU", "LABBARB"    Radiological Exams on Admission: MR Brain W and Wo Contrast  Result Date: 08/05/2022 CLINICAL DATA:  Acute neuro deficit EXAM: MRI HEAD WITHOUT AND WITH CONTRAST MRA HEAD WITHOUT CONTRAST TECHNIQUE: Multiplanar, multi-echo pulse sequences of the brain and surrounding structures were acquired without and with intravenous contrast. Angiographic images of the Circle of Willis were acquired using MRA technique without intravenous contrast. CONTRAST:  11mL GADAVIST GADOBUTROL 1 MMOL/ML IV SOLN COMPARISON:  CT head 08/05/2022 FINDINGS: MRI HEAD FINDINGS Brain: No acute infarction, hemorrhage, hydrocephalus, extra-axial collection or mass lesion. No pathologic intracranial enhancement. Small periventricular white matter hyperintensities bilaterally. Brainstem and posterior fossa normal. Normal enhancement. Vascular: Normal arterial flow voids Skull and upper cervical spine: No focal skeletal lesion. Sinuses/Orbits: Paranasal sinuses clear.  Negative orbit Other: None MRA HEAD FINDINGS Anterior circulation: Internal carotid artery widely patent bilaterally. Anterior and middle cerebral arteries normal bilaterally. Posterior circulation: Both vertebral arteries patent to the basilar. Basilar widely patent. Superior cerebellar and posterior cerebral arteries patent bilaterally. Anatomic variants: Negative for aneurysm IMPRESSION: Negative for acute infarct. Several small periventricular white matter  hyperintensities. Distribution raises the possibility of multiple sclerosis. Chronic small vessel ischemia also in the differential Negative MRA head Electronically Signed   By: Franchot Gallo M.D.   On: 08/05/2022 18:58   MR ANGIO HEAD WO CONTRAST  Result Date: 08/05/2022 CLINICAL DATA:  Acute neuro deficit EXAM: MRI HEAD WITHOUT AND WITH CONTRAST MRA HEAD WITHOUT CONTRAST TECHNIQUE: Multiplanar, multi-echo pulse sequences of the brain and surrounding structures were acquired without and with intravenous contrast.  Angiographic images of the Circle of Willis were acquired using MRA technique without intravenous contrast. CONTRAST:  2mL GADAVIST GADOBUTROL 1 MMOL/ML IV SOLN COMPARISON:  CT head 08/05/2022 FINDINGS: MRI HEAD FINDINGS Brain: No acute infarction, hemorrhage, hydrocephalus, extra-axial collection or mass lesion. No pathologic intracranial enhancement. Small periventricular white matter hyperintensities bilaterally. Brainstem and posterior fossa normal. Normal enhancement. Vascular: Normal arterial flow voids Skull and upper cervical spine: No focal skeletal lesion. Sinuses/Orbits: Paranasal sinuses clear.  Negative orbit Other: None MRA HEAD FINDINGS Anterior circulation: Internal carotid artery widely patent bilaterally. Anterior and middle cerebral arteries normal bilaterally. Posterior circulation: Both vertebral arteries patent to the basilar. Basilar widely patent. Superior cerebellar and posterior cerebral arteries patent bilaterally. Anatomic variants: Negative for aneurysm IMPRESSION: Negative for acute infarct. Several small periventricular white matter hyperintensities. Distribution raises the possibility of multiple sclerosis. Chronic small vessel ischemia also in the differential Negative MRA head Electronically Signed   By: Marlan Palau M.D.   On: 08/05/2022 18:58   CT HEAD WO CONTRAST  Result Date: 08/05/2022 CLINICAL DATA:  Acute neuro deficit.  Left facial numbness EXAM: CT  HEAD WITHOUT CONTRAST TECHNIQUE: Contiguous axial images were obtained from the base of the skull through the vertex without intravenous contrast. RADIATION DOSE REDUCTION: This exam was performed according to the departmental dose-optimization program which includes automated exposure control, adjustment of the mA and/or kV according to patient size and/or use of iterative reconstruction technique. COMPARISON:  CT temporal bone 10/02/2015 FINDINGS: Brain: No evidence of acute infarction, hemorrhage, hydrocephalus, extra-axial collection or mass lesion/mass effect. Vascular: Negative for hyperdense vessel Skull: Negative Sinuses/Orbits: Paranasal sinuses clear. Bilateral mastoidectomy. Mastoidectomy cavity clear. [place] appears to been removed. Negative orbit Other: None IMPRESSION: Negative CT of the brain Bilateral mastoidectomy. Electronically Signed   By: Marlan Palau M.D.   On: 08/05/2022 15:31     ------------------------------------------------------------------------------------------------------ Assessment/Plan: Principal Problem:   Facial numbness  Left facial numbness Rule out multiple sclerosis Symptoms constant for last 4 days.  Also stated with left arm numbness CT head and MRI brain normal MRA head showing several small periventricular white matter hypodensities raising suspicion for multiple sclerosis EDP discussed the case with neurologist on-call Dr. Selina Cooley.  Recommended to obtain MRI cervical and thoracic spine and an inpatient evaluation by neurology in the morning. I ordered for imagings as recommended.  Unable to obtain them at this hour tonight.  At patient's request, I ordered for Ativan 1 mg as needed to be given before MRI. Monitor symptoms overnight  Elevated blood pressure Denies history of hypertension.  Not on any meds at home.  Continue to monitor blood pressure  Morbid obesity  -Body mass index is 55.68 kg/m. Patient has been advised to make an attempt to  improve diet and exercise patterns to aid in weight loss.  Hypothyroidism Synthroid  GERD PPI  Anxiety/depression, panic attacks Prozac, as needed Ativan  Goals of care - -  Code Status: Prior full code  Diet:  Diet Order             Diet NPO time specified  Diet effective now                  DVT prophylaxis: Lovenox subcu  Antimicrobials: None Fluid: None Consultants: Neurology to be formally consulted tomorrow Family Communication: Family member at bedside Dispo: The patient is from: Home              Anticipated d/c is to: Home in 1 to 2  days   ------------------------------------------------------------------------------------- Severity of Illness: The appropriate patient status for this patient is OBSERVATION. Observation status is judged to be reasonable and necessary in order to provide the required intensity of service to ensure the patient's safety. The patient's presenting symptoms, physical exam findings, and initial radiographic and laboratory data in the context of their medical condition is felt to place them at decreased risk for further clinical deterioration. Furthermore, it is anticipated that the patient will be medically stable for discharge from the hospital within 2 midnights of admission.    Signed, Terrilee Croak, MD [facility] 08/05/2022

## 2022-08-06 ENCOUNTER — Emergency Department (HOSPITAL_COMMUNITY): Payer: Medicaid Other

## 2022-08-06 ENCOUNTER — Other Ambulatory Visit: Payer: Self-pay

## 2022-08-06 ENCOUNTER — Encounter (HOSPITAL_COMMUNITY): Payer: Self-pay | Admitting: Emergency Medicine

## 2022-08-06 ENCOUNTER — Emergency Department (HOSPITAL_COMMUNITY)
Admission: EM | Admit: 2022-08-06 | Discharge: 2022-08-06 | Disposition: A | Discharge status: Home / Self Care | Payer: Medicaid Other | Attending: Emergency Medicine | Admitting: Emergency Medicine

## 2022-08-06 DIAGNOSIS — R209 Unspecified disturbances of skin sensation: Secondary | ICD-10-CM | POA: Diagnosis not present

## 2022-08-06 DIAGNOSIS — F419 Anxiety disorder, unspecified: Secondary | ICD-10-CM | POA: Insufficient documentation

## 2022-08-06 DIAGNOSIS — E039 Hypothyroidism, unspecified: Secondary | ICD-10-CM | POA: Diagnosis not present

## 2022-08-06 DIAGNOSIS — M25512 Pain in left shoulder: Secondary | ICD-10-CM | POA: Insufficient documentation

## 2022-08-06 DIAGNOSIS — R0789 Other chest pain: Secondary | ICD-10-CM | POA: Insufficient documentation

## 2022-08-06 DIAGNOSIS — R200 Anesthesia of skin: Secondary | ICD-10-CM

## 2022-08-06 DIAGNOSIS — R079 Chest pain, unspecified: Secondary | ICD-10-CM

## 2022-08-06 DIAGNOSIS — Z79899 Other long term (current) drug therapy: Secondary | ICD-10-CM | POA: Diagnosis not present

## 2022-08-06 LAB — BASIC METABOLIC PANEL
Anion gap: 8 (ref 5–15)
BUN: 8 mg/dL (ref 6–20)
CO2: 22 mmol/L (ref 22–32)
Calcium: 9.4 mg/dL (ref 8.9–10.3)
Chloride: 107 mmol/L (ref 98–111)
Creatinine, Ser: 1.03 mg/dL — ABNORMAL HIGH (ref 0.44–1.00)
GFR, Estimated: >60 mL/min (ref >60)
Glucose, Bld: 109 mg/dL — ABNORMAL HIGH (ref 70–99)
Potassium: 3.5 mmol/L (ref 3.5–5.1)
Sodium: 137 mmol/L (ref 135–145)

## 2022-08-06 LAB — TROPONIN I (HIGH SENSITIVITY)
Troponin I (High Sensitivity): <2 ng/L (ref <18)
Troponin I (High Sensitivity): <2 ng/L (ref <18)

## 2022-08-06 LAB — CBC
HCT: 42.2 % (ref 36.0–46.0)
Hemoglobin: 14.1 g/dL (ref 12.0–15.0)
MCH: 29.3 pg (ref 26.0–34.0)
MCHC: 33.4 g/dL (ref 30.0–36.0)
MCV: 87.7 fL (ref 80.0–100.0)
Platelets: 178 10*3/uL (ref 150–400)
RBC: 4.81 MIL/uL (ref 3.87–5.11)
RDW: 14.6 % (ref 11.5–15.5)
WBC: 8.5 10*3/uL (ref 4.0–10.5)
nRBC: 0 % (ref 0.0–0.2)

## 2022-08-06 MED ORDER — LORAZEPAM 2 MG/ML IJ SOLN
0.5000 mg | Freq: Once | INTRAMUSCULAR | Status: AC
  Administered 2022-08-06: 0.5 mg via INTRAVENOUS
  Filled 2022-08-06: qty 1

## 2022-08-06 MED ORDER — LORAZEPAM 2 MG/ML IJ SOLN
1.0000 mg | Freq: Once | INTRAMUSCULAR | Status: AC
  Administered 2022-08-06: 1 mg via INTRAVENOUS
  Filled 2022-08-06: qty 1

## 2022-08-06 MED ORDER — ONDANSETRON HCL 4 MG/2ML IJ SOLN
4.0000 mg | Freq: Once | INTRAMUSCULAR | Status: AC
  Administered 2022-08-06: 4 mg via INTRAVENOUS
  Filled 2022-08-06: qty 2

## 2022-08-06 MED ORDER — GADOBUTROL 1 MMOL/ML IV SOLN
10.0000 mL | Freq: Once | INTRAVENOUS | Status: AC | PRN
Start: 2022-08-06 — End: 2022-08-06
  Administered 2022-08-06: 10 mL via INTRAVENOUS

## 2022-08-06 MED ORDER — IBUPROFEN 400 MG PO TABS
600.0000 mg | ORAL_TABLET | Freq: Once | ORAL | Status: AC
Start: 2022-08-06 — End: 2022-08-06
  Administered 2022-08-06: 600 mg via ORAL
  Filled 2022-08-06: qty 2

## 2022-08-06 NOTE — ED Provider Notes (Signed)
Amber Colon Provider Note   CSN: [id number] Arrival date & time: 08/06/22  1021     History  Chief Complaint  Patient presents with   Chest Pain    Amber Colon is a 41 y.o. female with a history including hypothyroidism, GERD, anxiety/depression and panic attacks presenting to the ED for 2 complaints, the first being left-sided facial numbness which started about 4 days ago, in association she has had some waxing and waning numbness in her left arm.  She was seen here yesterday and underwent MRI imaging, her MRA head showed several small white matter hyperintensities raising concern for possible MS.  The plan at yesterday's visit was admission for obtaining MRI cervical and thoracic spine this morning and full neurologic evaluation.  She was actually admitted to the hospitalist service, but ended up leaving AMA prior to being moved to the floor.  She states that she became very anxious when thinking about having to undergo repeat MRI, therefore she went home.  The symptoms are unchanged, in fact she feels like the numbness on the top of her head is worse than it was yesterday.  Her second complaint is for chest pain which she describes as left-sided pressure which started late last night.  This pain radiates into her left shoulder, it is not associated with nausea or vomiting, palpitations, diaphoresis.  It has been constant since yesterday evening, and she has found no alleviators for her symptoms which spontaneously resolved since arriving here.  The history is provided by the patient.       Home Medications Prior to Admission medications   Medication Sig Start Date End Date Taking? Authorizing Provider  colestipol (COLESTID) 1 g tablet Take 2 tablets (2 g total) by mouth 2 (two) times daily. 01/04/20  Yes Ronnie Doss M, DO  levothyroxine (SYNTHROID) 88 MCG tablet Take 88 mcg by mouth daily. 06/10/22  Yes [provider]  pantoprazole (PROTONIX) 40  MG tablet Take 1 tablet (40 mg total) by mouth daily. (Needs to be seen before next refill) Patient taking differently: Take 40 mg by mouth 2 (two) times daily. (Needs to be seen before next refill) 07/14/20  Yes Ronnie Doss M, DO      Allergies    Amoxicillin    Review of Systems   Review of Systems  Constitutional:  Negative for chills and fever.  HENT:  Negative for congestion and sore throat.   Eyes: Negative.   Respiratory:  Negative for chest tightness and shortness of breath.   Cardiovascular:  Positive for chest pain.  Gastrointestinal:  Negative for abdominal pain and nausea.  Genitourinary: Negative.   Musculoskeletal:  Negative for arthralgias, joint swelling and neck pain.  Skin: Negative.  Negative for rash and wound.  Neurological:  Positive for numbness. Negative for dizziness, weakness, light-headedness and headaches.  Psychiatric/Behavioral: Negative.      Physical Exam Updated Vital Signs BP 136/77   Pulse 69   Temp 98 F (36.7 C) (Oral)   Resp 20   Ht '5\' 6"'  (1.676 m)   Wt (!) 156.5 kg   LMP 07/13/2022 (Approximate)   SpO2 96%   BMI 55.68 kg/m  Physical Exam Vitals and nursing note reviewed.  Constitutional:      Appearance: She is well-developed.  HENT:     Head: Normocephalic and atraumatic.  Eyes:     General: No visual field deficit.    Conjunctiva/sclera: Conjunctivae normal.  Cardiovascular:     Rate and  Rhythm: Normal rate and regular rhythm.     Heart sounds: Normal heart sounds.  Pulmonary:     Effort: Pulmonary effort is normal.     Breath sounds: Normal breath sounds. No decreased breath sounds or wheezing.  Abdominal:     General: Bowel sounds are normal.     Palpations: Abdomen is soft.     Tenderness: There is no abdominal tenderness.  Musculoskeletal:        General: Normal range of motion.     Cervical back: Normal range of motion.  Skin:    General: Skin is warm and dry.     Findings: No erythema or rash.   Neurological:     Mental Status: She is alert.     Cranial Nerves: Cranial nerves 2-12 are intact. No cranial nerve deficit, dysarthria or facial asymmetry.     Motor: Motor function is intact.     Coordination: Coordination is intact. Finger-Nose-Finger Test normal. Rapid alternating movements normal.     Comments: Reduced sensation to fine touch in the forehead and maxillary branches left face, less involving the left jaw.  She has no motor weakness, no facial droop.  Grip strengths are equal bilaterally.  No pronator drift.     ED Results / Procedures / Treatments   Labs (all labs ordered are listed, but only abnormal results are displayed) Labs Reviewed  BASIC METABOLIC PANEL - Abnormal; Notable for the following components:      Result Value   Glucose, Bld 109 (*)    Creatinine, Ser 1.03 (*)    All other components within normal limits  CBC  POC URINE PREG, ED  TROPONIN I (HIGH SENSITIVITY)  TROPONIN I (HIGH SENSITIVITY)    EKG EKG Interpretation  Date/Time:  Friday August 06 2022 11:10:51 EDT Ventricular Rate:  80 PR Interval:  169 QRS Duration: 101 QT Interval:  417 QTC Calculation: 482 R Axis:   94 Text Interpretation: Sinus rhythm Borderline right axis deviation Low voltage, precordial leads RSR' in V1 or V2, probably normal variant Consider inferior infarct No acute changes No significant change since last tracing Confirmed by Varney Biles [phone]) on 08/06/2022 12:38:27 PM  Radiology MR Cervical Spine W or Wo Contrast  Result Date: 08/06/2022 CLINICAL DATA:  Left facial and arm numbness, abnormal brain MRI EXAM: MRI CERVICAL AND THORACIC SPINE WITHOUT AND WITH CONTRAST TECHNIQUE: Multiplanar and multiecho pulse sequences of the cervical spine, to include the craniocervical junction and cervicothoracic junction, and the thoracic spine, were obtained without and with intravenous contrast. CONTRAST:  28m GADAVIST GADOBUTROL 1 MMOL/ML IV SOLN COMPARISON:  None  Available. FINDINGS: MRI CERVICAL SPINE Alignment: No significant listhesis. Vertebrae: Vertebral body heights are maintained. No marrow edema. No suspicious osseous lesion. Mildly decreased T1 marrow signal probably reflects hematopoietic marrow. Cord: No abnormal signal or enhancement. Posterior Fossa, vertebral arteries, paraspinal tissues: Unremarkable Disc levels: Right paracentral/foraminal protrusion with endplate osteophytes and uncovertebral hypertrophy at C5-C6. Associated mild right foraminal narrowing. No significant canal stenosis at any level. MRI THORACIC SPINE FINDINGS Alignment:  No significant listhesis. Vertebrae: Midthoracic mild degenerative endplate irregularity. No marrow edema. No suspicious osseous lesion. Mildly decreased T1 marrow signal probably reflects hematopoietic marrow. Cord:  No abnormal signal or enhancement. Paraspinal and other soft tissues: Unremarkable. Disc levels: Small central disc protrusion at T6-T7. No significant canal or foraminal narrowing at any level. IMPRESSION: No abnormal cord signal or enhancement. Electronically Signed   By: PMacy MisM.D.   On: 08/06/2022 15:57  MR THORACIC SPINE W WO CONTRAST  Result Date: 08/06/2022 CLINICAL DATA:  Left facial and arm numbness, abnormal brain MRI EXAM: MRI CERVICAL AND THORACIC SPINE WITHOUT AND WITH CONTRAST TECHNIQUE: Multiplanar and multiecho pulse sequences of the cervical spine, to include the craniocervical junction and cervicothoracic junction, and the thoracic spine, were obtained without and with intravenous contrast. CONTRAST:  48m GADAVIST GADOBUTROL 1 MMOL/ML IV SOLN COMPARISON:  None Available. FINDINGS: MRI CERVICAL SPINE Alignment: No significant listhesis. Vertebrae: Vertebral body heights are maintained. No marrow edema. No suspicious osseous lesion. Mildly decreased T1 marrow signal probably reflects hematopoietic marrow. Cord: No abnormal signal or enhancement. Posterior Fossa, vertebral  arteries, paraspinal tissues: Unremarkable Disc levels: Right paracentral/foraminal protrusion with endplate osteophytes and uncovertebral hypertrophy at C5-C6. Associated mild right foraminal narrowing. No significant canal stenosis at any level. MRI THORACIC SPINE FINDINGS Alignment:  No significant listhesis. Vertebrae: Midthoracic mild degenerative endplate irregularity. No marrow edema. No suspicious osseous lesion. Mildly decreased T1 marrow signal probably reflects hematopoietic marrow. Cord:  No abnormal signal or enhancement. Paraspinal and other soft tissues: Unremarkable. Disc levels: Small central disc protrusion at T6-T7. No significant canal or foraminal narrowing at any level. IMPRESSION: No abnormal cord signal or enhancement. Electronically Signed   By: PMacy MisM.D.   On: 08/06/2022 15:57   DG Chest 2 View  Result Date: 08/06/2022 CLINICAL DATA:  Left-sided chest pain radiating to left shoulder. EXAM: CHEST - 2 VIEW COMPARISON:  03/20/2022 FINDINGS: Lungs are adequately inflated with no focal airspace consolidation or effusion. Cardiomediastinal silhouette and remainder of the exam is unchanged. IMPRESSION: No active cardiopulmonary disease. Electronically Signed   By: DMarin OlpM.D.   On: 08/06/2022 11:49   MR Brain W and Wo Contrast  Result Date: 08/05/2022 CLINICAL DATA:  Acute neuro deficit EXAM: MRI HEAD WITHOUT AND WITH CONTRAST MRA HEAD WITHOUT CONTRAST TECHNIQUE: Multiplanar, multi-echo pulse sequences of the brain and surrounding structures were acquired without and with intravenous contrast. Angiographic images of the Circle of Willis were acquired using MRA technique without intravenous contrast. CONTRAST:  1104mGADAVIST GADOBUTROL 1 MMOL/ML IV SOLN COMPARISON:  CT head 08/05/2022 FINDINGS: MRI HEAD FINDINGS Brain: No acute infarction, hemorrhage, hydrocephalus, extra-axial collection or mass lesion. No pathologic intracranial enhancement. Small periventricular white  matter hyperintensities bilaterally. Brainstem and posterior fossa normal. Normal enhancement. Vascular: Normal arterial flow voids Skull and upper cervical spine: No focal skeletal lesion. Sinuses/Orbits: Paranasal sinuses clear.  Negative orbit Other: None MRA HEAD FINDINGS Anterior circulation: Internal carotid artery widely patent bilaterally. Anterior and middle cerebral arteries normal bilaterally. Posterior circulation: Both vertebral arteries patent to the basilar. Basilar widely patent. Superior cerebellar and posterior cerebral arteries patent bilaterally. Anatomic variants: Negative for aneurysm IMPRESSION: Negative for acute infarct. Several small periventricular white matter hyperintensities. Distribution raises the possibility of multiple sclerosis. Chronic small vessel ischemia also in the differential Negative MRA head Electronically Signed   By: ChFranchot Gallo.D.   On: 08/05/2022 18:58   MR ANGIO HEAD WO CONTRAST  Result Date: 08/05/2022 CLINICAL DATA:  Acute neuro deficit EXAM: MRI HEAD WITHOUT AND WITH CONTRAST MRA HEAD WITHOUT CONTRAST TECHNIQUE: Multiplanar, multi-echo pulse sequences of the brain and surrounding structures were acquired without and with intravenous contrast. Angiographic images of the Circle of Willis were acquired using MRA technique without intravenous contrast. CONTRAST:  1076mADAVIST GADOBUTROL 1 MMOL/ML IV SOLN COMPARISON:  CT head 08/05/2022 FINDINGS: MRI HEAD FINDINGS Brain: No acute infarction, hemorrhage, hydrocephalus, extra-axial collection or mass lesion.  No pathologic intracranial enhancement. Small periventricular white matter hyperintensities bilaterally. Brainstem and posterior fossa normal. Normal enhancement. Vascular: Normal arterial flow voids Skull and upper cervical spine: No focal skeletal lesion. Sinuses/Orbits: Paranasal sinuses clear.  Negative orbit Other: None MRA HEAD FINDINGS Anterior circulation: Internal carotid artery widely patent  bilaterally. Anterior and middle cerebral arteries normal bilaterally. Posterior circulation: Both vertebral arteries patent to the basilar. Basilar widely patent. Superior cerebellar and posterior cerebral arteries patent bilaterally. Anatomic variants: Negative for aneurysm IMPRESSION: Negative for acute infarct. Several small periventricular white matter hyperintensities. Distribution raises the possibility of multiple sclerosis. Chronic small vessel ischemia also in the differential Negative MRA head Electronically Signed   By: Franchot Gallo M.D.   On: 08/05/2022 18:58   CT HEAD WO CONTRAST  Result Date: 08/05/2022 CLINICAL DATA:  Acute neuro deficit.  Left facial numbness EXAM: CT HEAD WITHOUT CONTRAST TECHNIQUE: Contiguous axial images were obtained from the base of the skull through the vertex without intravenous contrast. RADIATION DOSE REDUCTION: This exam was performed according to the departmental dose-optimization program which includes automated exposure control, adjustment of the mA and/or kV according to patient size and/or use of iterative reconstruction technique. COMPARISON:  CT temporal bone 10/02/2015 FINDINGS: Brain: No evidence of acute infarction, hemorrhage, hydrocephalus, extra-axial collection or mass lesion/mass effect. Vascular: Negative for hyperdense vessel Skull: Negative Sinuses/Orbits: Paranasal sinuses clear. Bilateral mastoidectomy. Mastoidectomy cavity clear. Ossicles appears to been removed. Negative orbit Other: None IMPRESSION: Negative CT of the brain Bilateral mastoidectomy. Electronically Signed   By: Franchot Gallo M.D.   On: 08/05/2022 15:31    Procedures Procedures    Medications Ordered in ED Medications  LORazepam (ATIVAN) injection 1 mg (1 mg Intravenous Given 08/06/22 1242)  ondansetron (ZOFRAN) injection 4 mg (4 mg Intravenous Given 08/06/22 1241)  LORazepam (ATIVAN) injection 0.5 mg (0.5 mg Intravenous Given 08/06/22 1433)  gadobutrol (GADAVIST) 1  MMOL/ML injection 10 mL (10 mLs Intravenous Contrast Given 08/06/22 1537)    ED Course/ Medical Decision Making/ A&P                           Medical Decision Making Patient with persistent left scalp and facial numbness including her left arm, per work-up yesterday there is concern raised about possible new diagnosed MS.  Patient had been admitted, unfortunately she eloped prior to getting the complete work-up.  She returns today at which time she did undergo MRI imaging of her C-spine and thoracic spine which were negative for any MS suggestive lesions.  This patient was discussed with Dr. Livia Snellen of neurology, given there is no abnormal signal on her MRI C-spine or thoracic spine, there is no indication for admission or institution of steroids at this time.  She did recommend close outpatient neurologic follow-up.  This was discussed with the patient and she is understanding of the thought process, her test results and her need for follow-up.  She will call Dr. Merlene Laughter for office follow-up.  Of note, she has had delta troponins, both negative, she is chest pain-free.  I suspect the symptoms possibly secondary to anxiety, no evidence for this being unstable angina.  Amount and/or Complexity of Data Reviewed External Data Reviewed: labs. Labs: ordered.    Details: Delta troponins are negative.  Be met, CBC normal. Radiology: ordered.    Details: Per above. ECG/medicine tests: ordered and independent interpretation performed.    Details: No acute changes since prior EKG.  Risk Prescription drug  management.           Final Clinical Impression(s) / ED Diagnoses Final diagnoses:  Left facial numbness  Nonspecific chest pain    Rx / DC Orders ED Discharge Orders     None         Landis Martins 08/06/22 [place], MD 08/07/22 [phone]

## 2022-08-06 NOTE — ED Notes (Signed)
Unable to manual upload urine preg result in POC lab. Result negative

## 2022-08-06 NOTE — ED Notes (Signed)
Amber Colon in MRI made aware the pt need to be medicated prior to going for her test.

## 2022-08-06 NOTE — ED Notes (Signed)
Lab at bedside

## 2022-08-06 NOTE — ED Notes (Signed)
Pt continues to be in MRI  

## 2022-08-06 NOTE — Discharge Instructions (Signed)
Return here if you have any new or worsening symptoms.  Today's studies are reassuring but as discussed you do need formal evaluation by a neurologist secondary to the abnormal findings on yesterday's MRI.  Your chest pain work-up is normal today as well.  Follow-up with your primary doctor if you have any return of similar symptoms.

## 2022-08-06 NOTE — ED Notes (Signed)
Pt refused MRI after ativan was given. EDP made aware.

## 2022-08-06 NOTE — ED Triage Notes (Signed)
Pt to the ED via [facility] with complaints of chest pressure on the left. The pain radiates into her left shoulder.  Pt is also complaining about the top of her head being numb.  EMS reports a negative stroke screen.  Pt left AMA while waiting for admission yesterday.

## 2022-08-06 NOTE — ED Notes (Signed)
Patient transported to X-ray 

## 2022-08-06 NOTE — ED Notes (Signed)
Patient transported to MRI 

## 2022-08-08 ENCOUNTER — Emergency Department (HOSPITAL_COMMUNITY)
Admission: EM | Admit: 2022-08-08 | Discharge: 2022-08-09 | Disposition: A | Discharge status: Home / Self Care | Payer: Medicaid Other | Attending: Emergency Medicine | Admitting: Emergency Medicine

## 2022-08-08 DIAGNOSIS — R202 Paresthesia of skin: Secondary | ICD-10-CM | POA: Diagnosis present

## 2022-08-08 DIAGNOSIS — K029 Dental caries, unspecified: Secondary | ICD-10-CM | POA: Insufficient documentation

## 2022-08-09 MED ORDER — CLINDAMYCIN HCL 150 MG PO CAPS: 450.0000 mg | ORAL_CAPSULE | Freq: Three times a day (TID) | ORAL | 0 refills | Status: AC

## 2022-08-09 NOTE — Discharge Instructions (Signed)
Follow-up with a dentist.  We recommend that you take Clindamycin as prescribed to treat possible underlying infection.  Continue with Neurology follow up as previously advised for further evaluation of your facial numbness. Return to the ED for any new or concerning symptoms.

## 2022-08-09 NOTE — ED Provider Notes (Signed)
[facility] EMERGENCY DEPARTMENT Provider Note   CSN: [id number] Arrival date & time: 08/08/22  2336     History  Chief Complaint  Patient presents with   Dental Problem   facial numbness    Amber Colon is a 41 y.o. female.  41 year old female presents to the emergency department for evaluation of persistent left-sided facial paresthesias and numbness.  Has been evaluated for this in the emergency department on 2 prior occasions.  Was given instruction to follow-up with outpatient neurology to exclude possibility of MS based on previous MRI imaging.  Patient returns to the ED today questioning whether her symptoms may be due to her dental caries and decay.  She thinks there may be a problem with one of the nerves for her tooth.  Reports that palpation to her left upper lateral incisor causes exacerbation of her facial paresthesias.  She has not had any recent dental trauma, purulent drainage, fevers.  No difficulty swallowing or worsening numbness since prior evaluation.  She does not have established dental care.  The history is provided by the patient. No language interpreter was used.       Home Medications Prior to Admission medications   Medication Sig Start Date End Date Taking? Authorizing Provider  clindamycin (CLEOCIN) 150 MG capsule Take 3 capsules (450 mg total) by mouth 3 (three) times daily for 7 days. 08/09/22 08/16/22 Yes Antony Madura, PA-C  colestipol (COLESTID) 1 g tablet Take 2 tablets (2 g total) by mouth 2 (two) times daily. 01/04/20   Raliegh Ip, DO  levothyroxine (SYNTHROID) 88 MCG tablet Take 88 mcg by mouth daily. 06/10/22   [provider]  pantoprazole (PROTONIX) 40 MG tablet Take 1 tablet (40 mg total) by mouth daily. (Needs to be seen before next refill) Patient taking differently: Take 40 mg by mouth 2 (two) times daily. (Needs to be seen before next refill) 07/14/20   Raliegh Ip, DO      Allergies     Amoxicillin    Review of Systems   Review of Systems Ten systems reviewed and are negative for acute change, except as noted in the HPI.    Physical Exam Updated Vital Signs BP (!) 141/89 (BP Location: Right Arm)   Pulse 87   Temp 98.3 F (36.8 C) (Oral)   Resp 17   LMP 07/13/2022 (Approximate)   SpO2 97%   Physical Exam Vitals and nursing note reviewed.  Constitutional:      General: She is not in acute distress.    Appearance: She is well-developed. She is not diaphoretic.     Comments: Nontoxic-appearing and in no acute distress.  HENT:     Head: Normocephalic and atraumatic.     Mouth/Throat:     Comments: Multiple dental caries throughout mouth.  Varying degrees of dental decay.  No gingival swelling or fluctuance above the left upper lateral incisor which patient believes to be the problematic tooth.  No trismus or malocclusion.  Soft oral floor. Eyes:     General: No scleral icterus.    Conjunctiva/sclera: Conjunctivae normal.  Pulmonary:     Effort: Pulmonary effort is normal. No respiratory distress.  Musculoskeletal:        General: Normal range of motion.     Cervical back: Normal range of motion.  Skin:    General: Skin is warm and dry.     Coloration: Skin is not pale.     Findings: No erythema or rash.  Neurological:     Mental Status: She is alert and oriented to person, place, and time.     Coordination: Coordination normal.  Psychiatric:        Behavior: Behavior normal.     ED Results / Procedures / Treatments   Labs (all labs ordered are listed, but only abnormal results are displayed) Labs Reviewed - No data to display  EKG None  Radiology No results found.  Procedures Procedures    Medications Ordered in ED Medications - No data to display  ED Course/ Medical Decision Making/ A&P                           Medical Decision Making Risk Prescription drug management.   This patient presents to the ED for concern of facial  paresthesias, this involves an extensive number of treatment options, and is a complaint that carries with it a high risk of complications and morbidity.  The differential diagnosis includes CVA vs TIA vs shingles vs trigeminal neuralgia vs MS vs bell's palsy vs anxiety   Co morbidities that complicate the patient evaluation  Obesity HTN Depression and anxiety   Additional history obtained:  Additional history obtained from friend at bedside External records from outside source obtained and reviewed including MRI brain and C-L spine from 08/05/2022 and 08/06/2022   Cardiac Monitoring:  The patient was maintained on a cardiac monitor.  I personally viewed and interpreted the cardiac monitored which showed an underlying rhythm of: NSR   Medicines ordered and prescription drug management:  I have reviewed the patients home medicines and have made adjustments as needed   Test Considered:  Panorex   Problem List / ED Course:  Stable neurologic examination compared to prior ED work ups No evidence of dental infection or abscess today No red flags or signs concerning for Ludwig's angina Will refer to dental services and give Rx for Clindamycin given inability to exclude evolving infection   Reevaluation:  After the interventions noted above, I reevaluated the patient and found that they have :stayed the same   Social Determinants of Health:  Not established with dental provider   Dispostion:  After consideration of the diagnostic results and the patients response to treatment, I feel that the patent would benefit from outpatient dental follow up.  Also stressed to patient the importance of neurology follow-up as previously instructed. Return precautions discussed and provided. Patient discharged in stable condition with no unaddressed concerns.          Final Clinical Impression(s) / ED Diagnoses Final diagnoses:  Dental caries    Rx / DC Orders ED Discharge  Orders          Ordered    clindamycin (CLEOCIN) 150 MG capsule  3 times daily        08/09/22 0008              Antony Madura, PA-C 08/09/22 [place], MD 08/09/22 [phone]

## 2022-08-09 NOTE — ED Triage Notes (Signed)
Pt here for L side mouth numbness that has been slowly getting worse. Per pt she has a bad root canal on that side that was supposed to be removed, however she no longer has dental insurance, pt reports that pain is related to the tooth, no difficulty swallowing, no other numbness

## 2024-02-13 ENCOUNTER — Ambulatory Visit (INDEPENDENT_AMBULATORY_CARE_PROVIDER_SITE_OTHER): Payer: Medicaid Other

## 2024-02-13 ENCOUNTER — Ambulatory Visit (INDEPENDENT_AMBULATORY_CARE_PROVIDER_SITE_OTHER): Payer: Medicaid Other | Admitting: Podiatry

## 2024-02-13 DIAGNOSIS — M79672 Pain in left foot: Secondary | ICD-10-CM

## 2024-02-13 DIAGNOSIS — M722 Plantar fascial fibromatosis: Secondary | ICD-10-CM

## 2024-02-13 MED ORDER — TRIAMCINOLONE ACETONIDE 10 MG/ML IJ SUSP
2.5000 mg | Freq: Once | INTRAMUSCULAR | Status: AC
  Administered 2024-02-13: 2.5 mg via INTRA_ARTICULAR

## 2024-02-13 MED ORDER — MELOXICAM 15 MG PO TABS
15.0000 mg | ORAL_TABLET | Freq: Every day | ORAL | 0 refills | Status: DC
Start: 2024-02-13 — End: 2024-03-28

## 2024-02-13 MED ORDER — DEXAMETHASONE SODIUM PHOSPHATE 120 MG/30ML IJ SOLN
4.0000 mg | Freq: Once | INTRAMUSCULAR | Status: AC
Start: 2024-02-13 — End: 2024-02-13
  Administered 2024-02-13: 4 mg via INTRA_ARTICULAR

## 2024-02-13 NOTE — Progress Notes (Signed)
  Subjective:  Patient ID: Amber Colon, female    DOB: 1981-03-21,   MRN: [id number]  No chief complaint on file.   43 y.o. female presents for concern of left foot pain. Relates this has been ongoing for a year. She relates she feels like she is walking on glass First steps in the morning are the worse. She has had PF in the past and feels like this is the same. Had an injection before that helped. Denies any current treatments . Denies any other pedal complaints. Denies n/v/f/c.   Past Medical History:  Diagnosis Date   Anemia    not taking her iron pills either   Anxiety    Depression    Gallstones    GERD (gastroesophageal reflux disease)    Headache    "occasional"   History of blood transfusion    blood after miscarrige   Hypertension    doesn't take her bp pills   Hypothyroidism    again, takes no med   Morbid obesity (HCC) 01/16/2019   Obesity    Panic attack     Objective:  Physical Exam: Vascular: DP/PT pulses 2/4 bilateral. CFT <3 seconds. Normal hair growth on digits. No edema.  Skin. No lacerations or abrasions bilateral feet.  Musculoskeletal: MMT 5/5 bilateral lower extremities in DF, PF, Inversion and Eversion. Deceased ROM in DF of ankle joint. Tender to the medial calcaneal tubercle left . No pain with achilles, PT or arch. No pain with calcaneal squeeze.  Neurological: Sensation intact to light touch.   Assessment:   1. Plantar fasciitis, left      Plan:  Patient was evaluated and treated and all questions answered. Discussed plantar fasciitis with patient.  X-rays reviewed and discussed with patient. No acute fractures or dislocations noted. Mild spurring noted at inferior calcaneus.  Discussed treatment options including, ice, NSAIDS, supportive shoes, bracing, and stretching. Stretching exercises provided to be done on a daily basis.   Prescription for meloxicam provided and sent to pharmacy. Patient requesting injection today. Procedure note  below.  PF brace dispensed.   Follow-up 6 weeks or sooner if any problems arise. In the meantime, encouraged to call the office with any questions, concerns, change in symptoms.   Procedure:  Discussed etiology, pathology, conservative vs. surgical therapies. At this time a plantar fascial injection was recommended.  The patient agreed and a sterile skin prep was applied.  An injection consisting of  1cc dexamethasone 0.5 cc kenalog and 1cc marcaine mixture was infiltrated at the point of maximal tenderness on the left Heel.  Bandaid applied. The patient tolerated this well and was given instructions for aftercare.    Louann Sjogren, DPM

## 2024-02-13 NOTE — Patient Instructions (Signed)

## 2024-02-20 ENCOUNTER — Telehealth (INDEPENDENT_AMBULATORY_CARE_PROVIDER_SITE_OTHER): Payer: Self-pay | Admitting: Otolaryngology

## 2024-02-20 NOTE — Telephone Encounter (Signed)
 Left vm to confirm appt and address for 02/21/2024.

## 2024-02-21 ENCOUNTER — Ambulatory Visit (INDEPENDENT_AMBULATORY_CARE_PROVIDER_SITE_OTHER): Payer: Medicaid Other

## 2024-02-22 ENCOUNTER — Ambulatory Visit (INDEPENDENT_AMBULATORY_CARE_PROVIDER_SITE_OTHER): Admitting: Otolaryngology

## 2024-02-22 VITALS — BP 110/78 | HR 72 | Ht 66.0 in | Wt 315.0 lb

## 2024-02-22 DIAGNOSIS — R42 Dizziness and giddiness: Secondary | ICD-10-CM | POA: Diagnosis not present

## 2024-02-22 DIAGNOSIS — H7013 Chronic mastoiditis, bilateral: Secondary | ICD-10-CM

## 2024-02-26 DIAGNOSIS — H7013 Chronic mastoiditis, bilateral: Secondary | ICD-10-CM | POA: Insufficient documentation

## 2024-02-26 DIAGNOSIS — R42 Dizziness and giddiness: Secondary | ICD-10-CM | POA: Insufficient documentation

## 2024-02-26 NOTE — Progress Notes (Signed)
 Patient ID: Amber Colon, female   DOB: [birth year], 43 y.o.   MRN: [id number]  Follow-up: Bilateral chronic mastoiditis New complaint: Recurrent dizziness  HPI: The patient is a 43 year old female who presents today with a new complaint of recurrent dizziness.  She has been symptomatic for 2 months.  The patient was previously seen for bilateral chronic mastoiditis.  She previously underwent bilateral canal wall down tympanomastoidectomy surgeries.  She has been returning on a regular basis for debridement of her mastoid cavities.  According to the patient, she started experiencing recurrent dizziness 2 months ago.  She describes her dizziness as a spinning vertigo that lasts for several seconds.  It is often triggered with sudden head movement, especially when she is in bed.  She denies any significant otalgia, otorrhea, or change in her hearing.  Exam: General: Communicates without difficulty, well nourished, no acute distress. Head: Normocephalic, no evidence injury, no tenderness, facial buttresses intact without stepoff. Face/sinus: No tenderness to palpation and percussion. Facial movement is normal and symmetric. Eyes: PERRL, EOMI. No scleral icterus, conjunctivae clear. Neuro: CN II exam reveals vision grossly intact.  No nystagmus at any point of gaze. Ears: Auricles well formed without lesions.  Bilateral mastoid cavities with squamous debris and granulation tissue.  Nose: External evaluation reveals normal support and skin without lesions.  Dorsum is intact.  Anterior rhinoscopy reveals congested mucosa over anterior aspect of inferior turbinates and intact septum.  No purulence noted. Oral:  Oral cavity and oropharynx are intact, symmetric, without erythema or edema.  Mucosa is moist without lesions. Neck: Full range of motion without pain.  There is no significant lymphadenopathy.  No masses palpable.  Thyroid bed within normal limits to palpation.  Parotid glands and submandibular glands  equal bilaterally without mass.  Trachea is midline. Neuro:  CN 2-12 grossly intact. Vestibular: No nystagmus at any point of gaze. Dix Hallpike negative. Vestibular: There is no nystagmus with pneumatic pressure on either tympanic membrane or Valsalva. The cerebellar examination is unremarkable.    Procedure: Debridement of bilateral mastoid cavities Anesthesia: None Description: The patient is placed supine on the exam  table. Under the operating microscope, the right ear is examined. A large mastoid bowl is noted. A significant amount of squamous debris and granulation tissue are noted within the mastoid bowl. Under the operating microscope, the squamous debris and granulation tissue are carefully removed with a combination of suction catheters, cerumen curette, and alligator forceps. After the debridement procedure, the mastoid bowl is noted to be healthy without any recurrent cholesteatoma.  The same procedure is repeated on the left side.  The patient tolerated the procedure well.   Assessment: 1.  Bilateral chronic mastoiditis, with squamous debris and granulation tissue noted within the mastoid cavities. 2.  Recurrent dizziness of unknown etiology. The possible differential diagnoses include transient BPPV, vestibular migraine, Meniere's disease, peripheral vestibular dysfunction, or other central/systemic causes.  Based on her history, her dizziness is likely a result of transient BPPV. 3.  After the mastoid debridement procedure, her mastoid cavities are noted to be healthy without any recurrent cholesteatoma or acute infection.  Her Dix-Hallpike maneuver is negative.  Plan: 1.  Otomicroscopy with bilateral mastoid cavity debridement. 2.  The pathophysiology of vestibular dysfunction and dizziness are discussed extensively with the patient. The possible differential diagnoses are reviewed. Questions are invited and answered.   3.  The likely diagnosis of BPPV is discussed with the patient.   Since she is not currently symptomatic, no acute  intervention is recommended at this time. 4.  The patient will return for Epley maneuver if her dizziness recurs. 5.  The patient will return for reevaluation in 6 months.

## 2024-03-28 ENCOUNTER — Ambulatory Visit (INDEPENDENT_AMBULATORY_CARE_PROVIDER_SITE_OTHER): Payer: Medicaid Other | Admitting: Podiatry

## 2024-03-28 DIAGNOSIS — M722 Plantar fascial fibromatosis: Secondary | ICD-10-CM

## 2024-03-28 MED ORDER — MELOXICAM 15 MG PO TABS
15.0000 mg | ORAL_TABLET | Freq: Every day | ORAL | 0 refills | Status: AC
Start: 2024-03-28 — End: ?

## 2024-03-28 MED ORDER — DEXAMETHASONE SODIUM PHOSPHATE 120 MG/30ML IJ SOLN
4.0000 mg | Freq: Once | INTRAMUSCULAR | Status: AC
Start: 2024-03-28 — End: 2024-03-28
  Administered 2024-03-28: 4 mg via INTRA_ARTICULAR

## 2024-03-28 MED ORDER — TRIAMCINOLONE ACETONIDE 10 MG/ML IJ SUSP
2.5000 mg | Freq: Once | INTRAMUSCULAR | Status: AC
Start: 2024-03-28 — End: 2024-03-28
  Administered 2024-03-28: 2.5 mg via INTRA_ARTICULAR

## 2024-03-28 NOTE — Progress Notes (Signed)
  Subjective:  Patient ID: Amber Colon, female    DOB: 1981/02/24,   MRN: [id number]  No chief complaint on file.   43 y.o. female presents for concern of left foot pain. Relates injection did help a little and is about 40% better. She never got the meloxicam as it was lost. She relates she has been stretching.  Denies any current treatments . Denies any other pedal complaints. Denies n/v/f/c.   Past Medical History:  Diagnosis Date   Anemia    not taking her iron pills either   Anxiety    Depression    Gallstones    GERD (gastroesophageal reflux disease)    Headache    "occasional"   History of blood transfusion    blood after miscarrige   Hypertension    doesn't take her bp pills   Hypothyroidism    again, takes no med   Morbid obesity (HCC) 01/16/2019   Obesity    Panic attack     Objective:  Physical Exam: Vascular: DP/PT pulses 2/4 bilateral. CFT <3 seconds. Normal hair growth on digits. No edema.  Skin. No lacerations or abrasions bilateral feet.  Musculoskeletal: MMT 5/5 bilateral lower extremities in DF, PF, Inversion and Eversion. Deceased ROM in DF of ankle joint. Tender to the medial calcaneal tubercle left . No pain with achilles, PT or arch. No pain with calcaneal squeeze.  Neurological: Sensation intact to light touch.   Assessment:   1. Plantar fasciitis, left       Plan:  Patient was evaluated and treated and all questions answered. Discussed plantar fasciitis with patient.  X-rays reviewed and discussed with patient. No acute fractures or dislocations noted. Mild spurring noted at inferior calcaneus.  Discussed treatment options including, ice, NSAIDS, supportive shoes, bracing, and stretching. Continue stretching and refill of meloxicam provided.  AMB ref to PT.  Patient requesting injection today. Procedure note below.  PF brace continue  Follow-up 8 weeks or sooner if any problems arise. In the meantime, encouraged to call the office with any  questions, concerns, change in symptoms.   Procedure:  Discussed etiology, pathology, conservative vs. surgical therapies. At this time a plantar fascial injection was recommended.  The patient agreed and a sterile skin prep was applied.  An injection consisting of  1cc dexamethasone 0.5 cc kenalog and 1cc marcaine mixture was infiltrated at the point of maximal tenderness on the left Heel.  Bandaid applied. The patient tolerated this well and was given instructions for aftercare.    Louann Sjogren, DPM

## 2024-04-24 ENCOUNTER — Ambulatory Visit

## 2024-04-30 ENCOUNTER — Ambulatory Visit

## 2024-05-07 ENCOUNTER — Ambulatory Visit

## 2024-05-23 ENCOUNTER — Ambulatory Visit (INDEPENDENT_AMBULATORY_CARE_PROVIDER_SITE_OTHER): Admitting: Podiatry

## 2024-05-23 DIAGNOSIS — Z91199 Patient's noncompliance with other medical treatment and regimen due to unspecified reason: Secondary | ICD-10-CM

## 2024-05-23 NOTE — Progress Notes (Signed)
 No show

## 2024-08-29 ENCOUNTER — Ambulatory Visit (INDEPENDENT_AMBULATORY_CARE_PROVIDER_SITE_OTHER): Admitting: Otolaryngology

## 2024-08-29 VITALS — BP 123/83 | HR 75

## 2024-08-29 DIAGNOSIS — R42 Dizziness and giddiness: Secondary | ICD-10-CM

## 2024-08-29 DIAGNOSIS — G35 Multiple sclerosis: Secondary | ICD-10-CM

## 2024-08-29 DIAGNOSIS — H7013 Chronic mastoiditis, bilateral: Secondary | ICD-10-CM

## 2024-08-29 DIAGNOSIS — H608X3 Other otitis externa, bilateral: Secondary | ICD-10-CM | POA: Diagnosis not present

## 2024-08-29 MED ORDER — MOMETASONE FUROATE 0.1 % EX CREA: TOPICAL_CREAM | CUTANEOUS | 3 refills | Status: AC

## 2024-09-01 DIAGNOSIS — G35 Multiple sclerosis: Secondary | ICD-10-CM | POA: Insufficient documentation

## 2024-09-01 NOTE — Progress Notes (Signed)
 Patient ID: Amber Colon, female   DOB: 1981/04/26, 43 y.o.   MRN: [id number]  Follow-up: Recurrent dizziness, bilateral chronic mastoiditis  HPI: The patient is a 43 year old female who returns today for her follow-up evaluation.  The patient has a history of bilateral chronic mastoiditis.  She previously underwent bilateral canal wall down tympanomastoidectomy surgeries.  At her last visit in March 2025, the patient was also complaining of recurrent dizziness.  Her dizziness was described as a spinning vertigo that last for several minutes.  The patient returns today complaining of more recurrent dizziness.  In addition, she has also noted intermittent visual loss and facial numbness.  She was recently diagnosed with multiple sclerosis.  4 lesions were noted in her brain.  Her multiple sclerosis is currently being managed medically by her neurologist.  She also complains of frequent itchy sensation in her ears.  She denies any otalgia or otorrhea.  Exam: General: Communicates without difficulty, well nourished, no acute distress. Head: Normocephalic, no evidence injury, no tenderness, facial buttresses intact without stepoff. Face/sinus: No tenderness to palpation and percussion. Facial movement is normal and symmetric. Eyes: PERRL, EOMI. No scleral icterus, conjunctivae clear. Neuro: CN II exam reveals vision grossly intact.  No nystagmus at any point of gaze. Ears: Auricles well formed without lesions.  Bilateral mastoid cavities with squamous debris and granulation tissue.  Nose: External evaluation reveals normal support and skin without lesions.  Dorsum is intact.  Anterior rhinoscopy reveals congested mucosa over anterior aspect of inferior turbinates and intact septum.  No purulence noted. Oral:  Oral cavity and oropharynx are intact, symmetric, without erythema or edema.  Mucosa is moist without lesions. Neck: Full range of motion without pain.  There is no significant lymphadenopathy.  No masses  palpable.  Thyroid  bed within normal limits to palpation.  Parotid glands and submandibular glands equal bilaterally without mass.  Trachea is midline. Neuro:  CN 2-12 grossly intact. Vestibular: No nystagmus at any point of gaze. Dix Hallpike negative. Vestibular: There is no nystagmus with pneumatic pressure on either tympanic membrane or Valsalva. The cerebellar examination is unremarkable.     Procedure: Debridement of bilateral mastoid cavities Anesthesia: None Description: The patient is placed supine on the exam  table. Under the operating microscope, the right ear is examined. A large mastoid bowl is noted. A significant amount of squamous debris and granulation tissue are noted within the mastoid bowl. Under the operating microscope, the squamous debris and granulation tissue are carefully removed with a combination of suction catheters, cerumen curette, and alligator forceps. After the debridement procedure, the mastoid bowl is noted to be healthy without any recurrent cholesteatoma.  Eczematous changes are noted at the entrance to the mastoid cavity.  The same procedure is repeated on the left side.  The patient tolerated the procedure well.   Assessment: 1.  Recurrent dizziness, likely secondary to her multiple sclerosis.  Other possible differential diagnoses include  transient BPPV, vestibular migraine, Meniere's disease, peripheral vestibular dysfunction, or other central/systemic causes.  2.  Bilateral chronic mastoiditis, with squamous debris and granulation tissue noted within the mastoid cavities. 3.  Bilateral chronic eczematous otitis externa.  Plan: 1.  Otomicroscopy with bilateral mastoid cavity debridement. 2.  The physical exam findings are reviewed with the patient. 3.  Elocon  cream to treat the chronic eczematous otitis externa. 4.  Bilateral dry ear precautions. 5.  Continue with medical management of her multiple sclerosis. 6.  The patient will return for reevaluation in  6  months.

## 2025-02-12 ENCOUNTER — Ambulatory Visit (INDEPENDENT_AMBULATORY_CARE_PROVIDER_SITE_OTHER): Admitting: Otolaryngology
